# Patient Record
Sex: Female | Born: 1965
Health system: Southern US, Community
[De-identification: ages and names within clinical notes are randomized; demographics above are authoritative.]

## PROBLEM LIST (undated history)

## (undated) DIAGNOSIS — E039 Hypothyroidism, unspecified: Secondary | ICD-10-CM

## (undated) DIAGNOSIS — G8929 Other chronic pain: Secondary | ICD-10-CM

## (undated) DIAGNOSIS — F419 Anxiety disorder, unspecified: Secondary | ICD-10-CM

## (undated) DIAGNOSIS — J45909 Unspecified asthma, uncomplicated: Secondary | ICD-10-CM

## (undated) DIAGNOSIS — G4730 Sleep apnea, unspecified: Secondary | ICD-10-CM

## (undated) DIAGNOSIS — Z9221 Personal history of antineoplastic chemotherapy: Secondary | ICD-10-CM

## (undated) DIAGNOSIS — K589 Irritable bowel syndrome without diarrhea: Secondary | ICD-10-CM

## (undated) DIAGNOSIS — K4090 Unilateral inguinal hernia, without obstruction or gangrene, not specified as recurrent: Secondary | ICD-10-CM

## (undated) DIAGNOSIS — C801 Malignant (primary) neoplasm, unspecified: Secondary | ICD-10-CM

## (undated) DIAGNOSIS — R51 Headache: Secondary | ICD-10-CM

## (undated) DIAGNOSIS — K5010 Crohn's disease of large intestine without complications: Secondary | ICD-10-CM

## (undated) DIAGNOSIS — Z9289 Personal history of other medical treatment: Secondary | ICD-10-CM

## (undated) DIAGNOSIS — R011 Cardiac murmur, unspecified: Secondary | ICD-10-CM

## (undated) DIAGNOSIS — Z923 Personal history of irradiation: Secondary | ICD-10-CM

## (undated) DIAGNOSIS — Z853 Personal history of malignant neoplasm of breast: Secondary | ICD-10-CM

## (undated) DIAGNOSIS — R519 Headache, unspecified: Secondary | ICD-10-CM

## (undated) HISTORY — DX: Malignant (primary) neoplasm, unspecified: C80.1

## (undated) HISTORY — DX: Unspecified asthma, uncomplicated: J45.909

## (undated) HISTORY — DX: Irritable bowel syndrome, unspecified: K58.9

## (undated) HISTORY — DX: Other chronic pain: G89.29

## (undated) HISTORY — DX: Crohn's disease of large intestine without complications: K50.10

## (undated) HISTORY — DX: Personal history of malignant neoplasm of breast: Z85.3

## (undated) HISTORY — DX: Anxiety disorder, unspecified: F41.9

## (undated) HISTORY — DX: Sleep apnea, unspecified: G47.30

## (undated) HISTORY — DX: Personal history of other medical treatment: Z92.89

## (undated) HISTORY — DX: Headache: R51

## (undated) HISTORY — DX: Unilateral inguinal hernia, without obstruction or gangrene, not specified as recurrent: K40.90

## (undated) HISTORY — DX: Hypothyroidism, unspecified: E03.9

## (undated) HISTORY — PX: INGUINAL HERNIA REPAIR: SUR1180

## (undated) HISTORY — DX: Headache, unspecified: R51.9

## (undated) HISTORY — PX: MASTECTOMY: SHX3

## (undated) HISTORY — DX: Cardiac murmur, unspecified: R01.1

---

## 1997-11-23 ENCOUNTER — Encounter: Admission: RE | Admit: 1997-11-23 | Discharge: 1998-02-21 | Payer: Self-pay | Admitting: Oncology

## 1997-11-28 ENCOUNTER — Encounter: Admission: RE | Admit: 1997-11-28 | Discharge: 1997-11-28 | Payer: Self-pay | Admitting: Family Medicine

## 1997-11-29 ENCOUNTER — Ambulatory Visit: Admission: RE | Admit: 1997-11-29 | Discharge: 1997-11-29 | Payer: Self-pay | Admitting: Radiation Oncology

## 1997-12-01 ENCOUNTER — Encounter: Admission: RE | Admit: 1997-12-01 | Discharge: 1997-12-01 | Payer: Self-pay | Admitting: Family Medicine

## 1997-12-07 ENCOUNTER — Encounter: Admission: RE | Admit: 1997-12-07 | Discharge: 1997-12-07 | Payer: Self-pay | Admitting: Family Medicine

## 1998-01-22 ENCOUNTER — Emergency Department (HOSPITAL_COMMUNITY): Admission: EM | Admit: 1998-01-22 | Discharge: 1998-01-22 | Payer: Self-pay | Admitting: Emergency Medicine

## 1998-03-07 ENCOUNTER — Encounter: Admission: RE | Admit: 1998-03-07 | Discharge: 1998-03-07 | Payer: Self-pay | Admitting: Sports Medicine

## 1998-05-18 ENCOUNTER — Ambulatory Visit: Admission: RE | Admit: 1998-05-18 | Discharge: 1998-05-18 | Payer: Self-pay | Admitting: General Surgery

## 1998-05-18 ENCOUNTER — Encounter: Payer: Self-pay | Admitting: General Surgery

## 1998-08-15 ENCOUNTER — Encounter: Admission: RE | Admit: 1998-08-15 | Discharge: 1998-08-15 | Payer: Self-pay | Admitting: Sports Medicine

## 1998-08-23 ENCOUNTER — Encounter: Admission: RE | Admit: 1998-08-23 | Discharge: 1998-11-21 | Payer: Self-pay | Admitting: Radiation Oncology

## 1999-01-04 ENCOUNTER — Ambulatory Visit (HOSPITAL_COMMUNITY): Admission: RE | Admit: 1999-01-04 | Discharge: 1999-01-04 | Payer: Self-pay | Admitting: Oncology

## 1999-01-04 ENCOUNTER — Encounter: Payer: Self-pay | Admitting: Oncology

## 1999-10-17 ENCOUNTER — Ambulatory Visit (HOSPITAL_COMMUNITY): Admission: RE | Admit: 1999-10-17 | Discharge: 1999-10-17 | Payer: Self-pay | Admitting: General Surgery

## 1999-10-17 ENCOUNTER — Encounter: Payer: Self-pay | Admitting: General Surgery

## 2000-04-24 ENCOUNTER — Ambulatory Visit (HOSPITAL_BASED_OUTPATIENT_CLINIC_OR_DEPARTMENT_OTHER): Admission: RE | Admit: 2000-04-24 | Discharge: 2000-04-24 | Payer: Self-pay | Admitting: *Deleted

## 2000-06-20 ENCOUNTER — Encounter: Admission: RE | Admit: 2000-06-20 | Discharge: 2000-06-20 | Payer: Self-pay | Admitting: Family Medicine

## 2000-10-09 ENCOUNTER — Encounter: Payer: Self-pay | Admitting: General Surgery

## 2000-10-09 ENCOUNTER — Encounter: Admission: RE | Admit: 2000-10-09 | Discharge: 2000-10-09 | Payer: Self-pay | Admitting: General Surgery

## 2000-10-10 ENCOUNTER — Encounter: Payer: Self-pay | Admitting: General Surgery

## 2000-10-10 ENCOUNTER — Encounter (INDEPENDENT_AMBULATORY_CARE_PROVIDER_SITE_OTHER): Payer: Self-pay | Admitting: *Deleted

## 2000-10-10 ENCOUNTER — Ambulatory Visit (HOSPITAL_BASED_OUTPATIENT_CLINIC_OR_DEPARTMENT_OTHER): Admission: RE | Admit: 2000-10-10 | Discharge: 2000-10-10 | Payer: Self-pay | Admitting: General Surgery

## 2001-05-28 ENCOUNTER — Encounter: Admission: RE | Admit: 2001-05-28 | Discharge: 2001-05-28 | Payer: Self-pay | Admitting: Family Medicine

## 2001-06-05 ENCOUNTER — Ambulatory Visit (HOSPITAL_COMMUNITY): Admission: RE | Admit: 2001-06-05 | Discharge: 2001-06-05 | Payer: Self-pay | Admitting: Obstetrics

## 2001-06-23 ENCOUNTER — Encounter: Admission: RE | Admit: 2001-06-23 | Discharge: 2001-06-23 | Payer: Self-pay | Admitting: Family Medicine

## 2001-06-24 ENCOUNTER — Emergency Department (HOSPITAL_COMMUNITY): Admission: EM | Admit: 2001-06-24 | Discharge: 2001-06-24 | Payer: Self-pay | Admitting: Emergency Medicine

## 2001-07-07 ENCOUNTER — Encounter: Payer: Self-pay | Admitting: Family Medicine

## 2001-07-07 ENCOUNTER — Ambulatory Visit (HOSPITAL_COMMUNITY): Admission: RE | Admit: 2001-07-07 | Discharge: 2001-07-07 | Payer: Self-pay | Admitting: Family Medicine

## 2001-07-14 ENCOUNTER — Encounter: Admission: RE | Admit: 2001-07-14 | Discharge: 2001-07-14 | Payer: Self-pay | Admitting: Sports Medicine

## 2001-08-21 ENCOUNTER — Encounter: Admission: RE | Admit: 2001-08-21 | Discharge: 2001-08-21 | Payer: Self-pay | Admitting: Family Medicine

## 2001-08-21 ENCOUNTER — Encounter: Payer: Self-pay | Admitting: Family Medicine

## 2001-09-08 ENCOUNTER — Encounter: Admission: RE | Admit: 2001-09-08 | Discharge: 2001-09-08 | Payer: Self-pay | Admitting: Family Medicine

## 2001-10-12 ENCOUNTER — Encounter: Payer: Self-pay | Admitting: General Surgery

## 2001-10-12 ENCOUNTER — Encounter: Admission: RE | Admit: 2001-10-12 | Discharge: 2001-10-12 | Payer: Self-pay | Admitting: General Surgery

## 2001-11-10 DIAGNOSIS — Z9289 Personal history of other medical treatment: Secondary | ICD-10-CM

## 2001-11-10 HISTORY — DX: Personal history of other medical treatment: Z92.89

## 2001-11-23 ENCOUNTER — Encounter: Admission: RE | Admit: 2001-11-23 | Discharge: 2001-11-23 | Payer: Self-pay | Admitting: Family Medicine

## 2001-11-25 ENCOUNTER — Ambulatory Visit (HOSPITAL_COMMUNITY): Admission: RE | Admit: 2001-11-25 | Discharge: 2001-11-25 | Payer: Self-pay | Admitting: Family Medicine

## 2001-11-25 ENCOUNTER — Encounter: Payer: Self-pay | Admitting: Family Medicine

## 2001-12-07 ENCOUNTER — Encounter: Admission: RE | Admit: 2001-12-07 | Discharge: 2001-12-22 | Payer: Self-pay | Admitting: Sports Medicine

## 2002-03-26 ENCOUNTER — Encounter: Admission: RE | Admit: 2002-03-26 | Discharge: 2002-03-26 | Payer: Self-pay | Admitting: Family Medicine

## 2002-06-10 ENCOUNTER — Encounter: Admission: RE | Admit: 2002-06-10 | Discharge: 2002-06-10 | Payer: Self-pay | Admitting: Family Medicine

## 2002-06-15 ENCOUNTER — Encounter: Payer: Self-pay | Admitting: Sports Medicine

## 2002-06-15 ENCOUNTER — Encounter: Admission: RE | Admit: 2002-06-15 | Discharge: 2002-06-15 | Payer: Self-pay | Admitting: Sports Medicine

## 2002-07-14 ENCOUNTER — Encounter: Payer: Self-pay | Admitting: General Surgery

## 2002-07-14 ENCOUNTER — Encounter (INDEPENDENT_AMBULATORY_CARE_PROVIDER_SITE_OTHER): Payer: Self-pay | Admitting: *Deleted

## 2002-07-14 ENCOUNTER — Other Ambulatory Visit: Admission: RE | Admit: 2002-07-14 | Discharge: 2002-07-14 | Payer: Self-pay | Admitting: Diagnostic Radiology

## 2002-07-14 ENCOUNTER — Encounter: Admission: RE | Admit: 2002-07-14 | Discharge: 2002-07-14 | Payer: Self-pay | Admitting: General Surgery

## 2002-07-19 ENCOUNTER — Encounter: Admission: RE | Admit: 2002-07-19 | Discharge: 2002-07-19 | Payer: Self-pay | Admitting: Family Medicine

## 2002-07-22 ENCOUNTER — Encounter: Admission: RE | Admit: 2002-07-22 | Discharge: 2002-07-22 | Payer: Self-pay | Admitting: Oncology

## 2002-07-22 ENCOUNTER — Encounter: Payer: Self-pay | Admitting: Oncology

## 2002-07-28 ENCOUNTER — Encounter: Payer: Self-pay | Admitting: Oncology

## 2002-07-28 ENCOUNTER — Encounter: Admission: RE | Admit: 2002-07-28 | Discharge: 2002-07-28 | Payer: Self-pay | Admitting: Oncology

## 2002-07-29 ENCOUNTER — Ambulatory Visit (HOSPITAL_COMMUNITY): Admission: RE | Admit: 2002-07-29 | Discharge: 2002-07-29 | Payer: Self-pay | Admitting: Oncology

## 2002-07-29 ENCOUNTER — Encounter: Payer: Self-pay | Admitting: Oncology

## 2002-07-29 ENCOUNTER — Encounter: Payer: Self-pay | Admitting: General Surgery

## 2002-08-03 ENCOUNTER — Ambulatory Visit (HOSPITAL_COMMUNITY): Admission: RE | Admit: 2002-08-03 | Discharge: 2002-08-05 | Payer: Self-pay | Admitting: General Surgery

## 2002-08-03 ENCOUNTER — Encounter (INDEPENDENT_AMBULATORY_CARE_PROVIDER_SITE_OTHER): Payer: Self-pay | Admitting: Specialist

## 2002-09-27 ENCOUNTER — Ambulatory Visit (HOSPITAL_BASED_OUTPATIENT_CLINIC_OR_DEPARTMENT_OTHER): Admission: RE | Admit: 2002-09-27 | Discharge: 2002-09-27 | Payer: Self-pay | Admitting: General Surgery

## 2002-09-27 ENCOUNTER — Encounter: Payer: Self-pay | Admitting: General Surgery

## 2002-10-12 ENCOUNTER — Encounter (INDEPENDENT_AMBULATORY_CARE_PROVIDER_SITE_OTHER): Payer: Self-pay | Admitting: Cardiology

## 2002-10-12 ENCOUNTER — Ambulatory Visit: Admission: RE | Admit: 2002-10-12 | Discharge: 2002-10-12 | Payer: Self-pay | Admitting: Oncology

## 2002-10-15 ENCOUNTER — Encounter: Payer: Self-pay | Admitting: Oncology

## 2002-10-15 ENCOUNTER — Observation Stay (HOSPITAL_COMMUNITY): Admission: EM | Admit: 2002-10-15 | Discharge: 2002-10-17 | Payer: Self-pay | Admitting: Oncology

## 2002-10-20 ENCOUNTER — Encounter: Admission: RE | Admit: 2002-10-20 | Discharge: 2002-10-20 | Payer: Self-pay | Admitting: General Surgery

## 2002-10-20 ENCOUNTER — Encounter: Payer: Self-pay | Admitting: General Surgery

## 2003-04-19 ENCOUNTER — Ambulatory Visit (HOSPITAL_BASED_OUTPATIENT_CLINIC_OR_DEPARTMENT_OTHER): Admission: RE | Admit: 2003-04-19 | Discharge: 2003-04-19 | Payer: Self-pay | Admitting: General Surgery

## 2003-04-26 ENCOUNTER — Encounter: Payer: Self-pay | Admitting: General Surgery

## 2003-04-26 ENCOUNTER — Encounter: Admission: RE | Admit: 2003-04-26 | Discharge: 2003-04-26 | Payer: Self-pay | Admitting: General Surgery

## 2003-07-12 ENCOUNTER — Encounter: Admission: RE | Admit: 2003-07-12 | Discharge: 2003-07-12 | Payer: Self-pay | Admitting: Family Medicine

## 2003-09-20 ENCOUNTER — Encounter: Admission: RE | Admit: 2003-09-20 | Discharge: 2003-09-20 | Payer: Self-pay | Admitting: Family Medicine

## 2003-10-21 ENCOUNTER — Encounter: Admission: RE | Admit: 2003-10-21 | Discharge: 2003-10-21 | Payer: Self-pay | Admitting: Oncology

## 2004-01-27 ENCOUNTER — Encounter: Admission: RE | Admit: 2004-01-27 | Discharge: 2004-01-27 | Payer: Self-pay | Admitting: General Surgery

## 2004-02-28 ENCOUNTER — Encounter (INDEPENDENT_AMBULATORY_CARE_PROVIDER_SITE_OTHER): Payer: Self-pay | Admitting: *Deleted

## 2004-02-28 ENCOUNTER — Ambulatory Visit (HOSPITAL_BASED_OUTPATIENT_CLINIC_OR_DEPARTMENT_OTHER): Admission: RE | Admit: 2004-02-28 | Discharge: 2004-02-28 | Payer: Self-pay | Admitting: Plastic Surgery

## 2004-02-28 ENCOUNTER — Ambulatory Visit (HOSPITAL_COMMUNITY): Admission: RE | Admit: 2004-02-28 | Discharge: 2004-02-28 | Payer: Self-pay | Admitting: Plastic Surgery

## 2004-03-30 ENCOUNTER — Encounter: Admission: RE | Admit: 2004-03-30 | Discharge: 2004-03-30 | Payer: Self-pay | Admitting: General Surgery

## 2004-10-22 ENCOUNTER — Encounter: Admission: RE | Admit: 2004-10-22 | Discharge: 2004-10-22 | Payer: Self-pay | Admitting: Oncology

## 2004-11-15 ENCOUNTER — Ambulatory Visit: Payer: Self-pay | Admitting: Oncology

## 2005-05-15 ENCOUNTER — Encounter: Admission: RE | Admit: 2005-05-15 | Discharge: 2005-05-15 | Payer: Self-pay | Admitting: General Surgery

## 2005-05-16 ENCOUNTER — Ambulatory Visit: Payer: Self-pay | Admitting: Oncology

## 2005-05-31 ENCOUNTER — Ambulatory Visit (HOSPITAL_COMMUNITY): Admission: RE | Admit: 2005-05-31 | Discharge: 2005-05-31 | Payer: Self-pay | Admitting: Family Medicine

## 2005-05-31 ENCOUNTER — Ambulatory Visit: Payer: Self-pay | Admitting: Sports Medicine

## 2005-06-01 ENCOUNTER — Ambulatory Visit (HOSPITAL_COMMUNITY): Admission: RE | Admit: 2005-06-01 | Discharge: 2005-06-01 | Payer: Self-pay | Admitting: Obstetrics and Gynecology

## 2005-07-25 ENCOUNTER — Encounter (INDEPENDENT_AMBULATORY_CARE_PROVIDER_SITE_OTHER): Payer: Self-pay | Admitting: Specialist

## 2005-07-25 ENCOUNTER — Observation Stay (HOSPITAL_COMMUNITY): Admission: RE | Admit: 2005-07-25 | Discharge: 2005-07-25 | Payer: Self-pay | Admitting: Obstetrics and Gynecology

## 2005-08-12 HISTORY — PX: TOTAL ABDOMINAL HYSTERECTOMY W/ BILATERAL SALPINGOOPHORECTOMY: SHX83

## 2005-08-16 ENCOUNTER — Ambulatory Visit: Payer: Self-pay | Admitting: Oncology

## 2005-10-23 ENCOUNTER — Encounter: Admission: RE | Admit: 2005-10-23 | Discharge: 2005-10-23 | Payer: Self-pay | Admitting: Oncology

## 2005-11-11 ENCOUNTER — Ambulatory Visit: Payer: Self-pay | Admitting: Family Medicine

## 2005-11-16 ENCOUNTER — Encounter (INDEPENDENT_AMBULATORY_CARE_PROVIDER_SITE_OTHER): Payer: Self-pay | Admitting: *Deleted

## 2005-11-16 LAB — CONVERTED CEMR LAB

## 2005-11-21 ENCOUNTER — Ambulatory Visit: Payer: Self-pay | Admitting: Oncology

## 2006-01-27 ENCOUNTER — Ambulatory Visit: Payer: Self-pay | Admitting: Sports Medicine

## 2006-04-04 ENCOUNTER — Ambulatory Visit (HOSPITAL_COMMUNITY): Admission: RE | Admit: 2006-04-04 | Discharge: 2006-04-04 | Payer: Self-pay | Admitting: Endocrinology

## 2006-06-24 ENCOUNTER — Encounter: Admission: RE | Admit: 2006-06-24 | Discharge: 2006-06-24 | Payer: Self-pay | Admitting: General Surgery

## 2006-06-30 ENCOUNTER — Encounter: Admission: RE | Admit: 2006-06-30 | Discharge: 2006-06-30 | Payer: Self-pay | Admitting: General Surgery

## 2006-07-23 ENCOUNTER — Ambulatory Visit: Payer: Self-pay | Admitting: Oncology

## 2006-07-25 LAB — CBC WITH DIFFERENTIAL/PLATELET
BASO%: 0.5 % (ref 0.0–2.0)
EOS%: 1.9 % (ref 0.0–7.0)
HCT: 36.2 % (ref 34.8–46.6)
MCH: 27.7 pg (ref 26.0–34.0)
MCHC: 33.4 g/dL (ref 32.0–36.0)
MONO#: 0.4 10*3/uL (ref 0.1–0.9)
NEUT%: 61.3 % (ref 39.6–76.8)
RBC: 4.35 10*6/uL (ref 3.70–5.32)
WBC: 4.7 10*3/uL (ref 3.9–10.0)
lymph#: 1.3 10*3/uL (ref 0.9–3.3)

## 2006-07-25 LAB — COMPREHENSIVE METABOLIC PANEL
ALT: 15 U/L (ref 0–35)
AST: 18 U/L (ref 0–37)
Calcium: 9.4 mg/dL (ref 8.4–10.5)
Chloride: 103 mEq/L (ref 96–112)
Creatinine, Ser: 0.56 mg/dL (ref 0.40–1.20)
Sodium: 139 mEq/L (ref 135–145)
Total Bilirubin: 0.3 mg/dL (ref 0.3–1.2)
Total Protein: 6.7 g/dL (ref 6.0–8.3)

## 2006-10-09 DIAGNOSIS — F410 Panic disorder [episodic paroxysmal anxiety] without agoraphobia: Secondary | ICD-10-CM | POA: Insufficient documentation

## 2006-10-09 DIAGNOSIS — N3941 Urge incontinence: Secondary | ICD-10-CM | POA: Insufficient documentation

## 2006-10-10 ENCOUNTER — Encounter (INDEPENDENT_AMBULATORY_CARE_PROVIDER_SITE_OTHER): Payer: Self-pay | Admitting: *Deleted

## 2006-12-11 ENCOUNTER — Encounter: Payer: Self-pay | Admitting: Family Medicine

## 2006-12-11 ENCOUNTER — Encounter: Admission: RE | Admit: 2006-12-11 | Discharge: 2006-12-11 | Payer: Self-pay | Admitting: Oncology

## 2007-01-27 ENCOUNTER — Telehealth: Payer: Self-pay | Admitting: *Deleted

## 2007-01-27 ENCOUNTER — Ambulatory Visit: Payer: Self-pay | Admitting: Family Medicine

## 2007-01-27 LAB — CONVERTED CEMR LAB
Nitrite: POSITIVE
Specific Gravity, Urine: 1.02
pH: 7.5

## 2007-04-21 ENCOUNTER — Ambulatory Visit: Payer: Self-pay | Admitting: Oncology

## 2007-06-26 ENCOUNTER — Encounter: Admission: RE | Admit: 2007-06-26 | Discharge: 2007-06-26 | Payer: Self-pay | Admitting: Oncology

## 2007-06-26 ENCOUNTER — Encounter: Payer: Self-pay | Admitting: Family Medicine

## 2007-07-14 ENCOUNTER — Encounter: Payer: Self-pay | Admitting: Family Medicine

## 2007-07-14 ENCOUNTER — Ambulatory Visit: Payer: Self-pay | Admitting: Family Medicine

## 2007-07-14 DIAGNOSIS — N951 Menopausal and female climacteric states: Secondary | ICD-10-CM | POA: Insufficient documentation

## 2007-07-14 DIAGNOSIS — E042 Nontoxic multinodular goiter: Secondary | ICD-10-CM | POA: Insufficient documentation

## 2007-07-14 DIAGNOSIS — M542 Cervicalgia: Secondary | ICD-10-CM | POA: Insufficient documentation

## 2007-07-14 DIAGNOSIS — M21619 Bunion of unspecified foot: Secondary | ICD-10-CM | POA: Insufficient documentation

## 2007-07-14 LAB — CONVERTED CEMR LAB
LDL Cholesterol: 137 mg/dL — ABNORMAL HIGH (ref 0–99)
TSH: 1.2 microintl units/mL (ref 0.350–5.50)
Triglycerides: 117 mg/dL (ref <150)

## 2007-07-16 ENCOUNTER — Encounter: Payer: Self-pay | Admitting: Family Medicine

## 2007-07-16 ENCOUNTER — Ambulatory Visit (HOSPITAL_COMMUNITY): Admission: RE | Admit: 2007-07-16 | Discharge: 2007-07-16 | Payer: Self-pay | Admitting: Family Medicine

## 2007-07-28 ENCOUNTER — Encounter: Admission: RE | Admit: 2007-07-28 | Discharge: 2007-08-12 | Payer: Self-pay | Admitting: Family Medicine

## 2007-08-17 ENCOUNTER — Encounter: Admission: RE | Admit: 2007-08-17 | Discharge: 2007-09-25 | Payer: Self-pay | Admitting: Family Medicine

## 2007-09-01 ENCOUNTER — Encounter: Payer: Self-pay | Admitting: Family Medicine

## 2007-09-30 ENCOUNTER — Encounter: Payer: Self-pay | Admitting: Family Medicine

## 2007-11-24 ENCOUNTER — Encounter: Payer: Self-pay | Admitting: Family Medicine

## 2007-12-17 ENCOUNTER — Ambulatory Visit: Payer: Self-pay | Admitting: Oncology

## 2007-12-21 ENCOUNTER — Encounter: Payer: Self-pay | Admitting: Family Medicine

## 2008-01-26 ENCOUNTER — Ambulatory Visit (HOSPITAL_BASED_OUTPATIENT_CLINIC_OR_DEPARTMENT_OTHER): Admission: RE | Admit: 2008-01-26 | Discharge: 2008-01-26 | Payer: Self-pay | Admitting: Orthopedic Surgery

## 2008-03-14 ENCOUNTER — Encounter: Admission: RE | Admit: 2008-03-14 | Discharge: 2008-03-14 | Payer: Self-pay | Admitting: Oncology

## 2008-03-16 ENCOUNTER — Ambulatory Visit: Payer: Self-pay | Admitting: Family Medicine

## 2008-03-16 LAB — CONVERTED CEMR LAB: Whiff Test: NEGATIVE

## 2008-04-11 ENCOUNTER — Encounter: Payer: Self-pay | Admitting: Family Medicine

## 2008-06-27 ENCOUNTER — Encounter: Admission: RE | Admit: 2008-06-27 | Discharge: 2008-06-27 | Payer: Self-pay | Admitting: Oncology

## 2008-07-13 ENCOUNTER — Ambulatory Visit: Payer: Self-pay | Admitting: Oncology

## 2008-07-15 ENCOUNTER — Encounter: Payer: Self-pay | Admitting: Family Medicine

## 2008-07-21 ENCOUNTER — Ambulatory Visit: Payer: Self-pay | Admitting: Family Medicine

## 2008-07-21 ENCOUNTER — Encounter: Payer: Self-pay | Admitting: Family Medicine

## 2008-07-21 DIAGNOSIS — IMO0002 Reserved for concepts with insufficient information to code with codable children: Secondary | ICD-10-CM | POA: Insufficient documentation

## 2008-07-21 DIAGNOSIS — N952 Postmenopausal atrophic vaginitis: Secondary | ICD-10-CM | POA: Insufficient documentation

## 2008-07-21 LAB — CONVERTED CEMR LAB

## 2008-08-12 HISTORY — PX: MASTECTOMY: SHX3

## 2008-12-13 ENCOUNTER — Encounter: Payer: Self-pay | Admitting: Family Medicine

## 2008-12-13 ENCOUNTER — Encounter: Admission: RE | Admit: 2008-12-13 | Discharge: 2008-12-13 | Payer: Self-pay | Admitting: Oncology

## 2009-01-10 ENCOUNTER — Ambulatory Visit: Payer: Self-pay | Admitting: Oncology

## 2009-01-12 ENCOUNTER — Encounter: Payer: Self-pay | Admitting: Family Medicine

## 2009-01-25 ENCOUNTER — Encounter: Admission: RE | Admit: 2009-01-25 | Discharge: 2009-01-25 | Payer: Self-pay | Admitting: Oncology

## 2009-06-27 ENCOUNTER — Encounter: Admission: RE | Admit: 2009-06-27 | Discharge: 2009-06-27 | Payer: Self-pay | Admitting: Oncology

## 2009-07-11 ENCOUNTER — Ambulatory Visit: Payer: Self-pay | Admitting: Oncology

## 2009-07-14 ENCOUNTER — Encounter: Payer: Self-pay | Admitting: Family Medicine

## 2009-08-14 ENCOUNTER — Ambulatory Visit: Payer: Self-pay | Admitting: Psychology

## 2009-08-23 ENCOUNTER — Ambulatory Visit: Payer: Self-pay | Admitting: Psychology

## 2009-09-06 ENCOUNTER — Encounter: Payer: Self-pay | Admitting: Family Medicine

## 2009-09-12 ENCOUNTER — Encounter: Payer: Self-pay | Admitting: Family Medicine

## 2009-09-12 ENCOUNTER — Ambulatory Visit: Payer: Self-pay | Admitting: Family Medicine

## 2009-09-12 DIAGNOSIS — F4321 Adjustment disorder with depressed mood: Secondary | ICD-10-CM | POA: Insufficient documentation

## 2009-09-13 ENCOUNTER — Encounter: Payer: Self-pay | Admitting: Family Medicine

## 2009-10-24 ENCOUNTER — Encounter: Payer: Self-pay | Admitting: Family Medicine

## 2009-12-26 ENCOUNTER — Encounter: Payer: Self-pay | Admitting: Family Medicine

## 2010-01-10 ENCOUNTER — Encounter: Admission: RE | Admit: 2010-01-10 | Discharge: 2010-01-10 | Payer: Self-pay | Admitting: Oncology

## 2010-01-31 ENCOUNTER — Ambulatory Visit: Payer: Self-pay | Admitting: Oncology

## 2010-02-02 ENCOUNTER — Encounter: Payer: Self-pay | Admitting: Family Medicine

## 2010-05-29 ENCOUNTER — Encounter: Admission: RE | Admit: 2010-05-29 | Discharge: 2010-05-29 | Payer: Self-pay | Admitting: Oncology

## 2010-06-12 ENCOUNTER — Encounter: Payer: Self-pay | Admitting: Family Medicine

## 2010-07-24 ENCOUNTER — Ambulatory Visit: Payer: Self-pay | Admitting: Oncology

## 2010-07-26 ENCOUNTER — Encounter: Payer: Self-pay | Admitting: Family Medicine

## 2010-08-30 ENCOUNTER — Telehealth: Payer: Self-pay | Admitting: *Deleted

## 2010-09-01 ENCOUNTER — Encounter: Payer: Self-pay | Admitting: Family Medicine

## 2010-09-01 ENCOUNTER — Encounter: Payer: Self-pay | Admitting: Sports Medicine

## 2010-09-02 ENCOUNTER — Encounter: Payer: Self-pay | Admitting: Endocrinology

## 2010-09-13 NOTE — Miscellaneous (Signed)
  Clinical Lists Changes  Medications: Removed medication of LYRICA 75 MG  CAPS (PREGABALIN) bid Added new medication of WELLBUTRIN 75 MG TABS (BUPROPION HCL) one two times a day - Signed Added new medication of LORAZEPAM 0.5 MG TABS (LORAZEPAM) three times a day as needed anxiety - Signed Rx of WELLBUTRIN 75 MG TABS (BUPROPION HCL) one two times a day;  #60 x 3;  Signed;  Entered by: Tereasa Coop NP;  Authorized by: Tereasa Coop NP;  Method used: Printed then faxed to  Rx of LORAZEPAM 0.5 MG TABS (LORAZEPAM) three times a day as needed anxiety;  #90 x 0;  Signed;  Entered by: Tereasa Coop NP;  Authorized by: Tereasa Coop NP;  Method used: Printed then faxed to Going through the breakup of her marriage, depressed, having anxiety and avoiding the situation.  Apt 2/1 with me.   Prescriptions: LORAZEPAM 0.5 MG TABS (LORAZEPAM) three times a day as needed anxiety  #90 x 0   Entered and Authorized by:   Tereasa Coop NP   Signed by:   Tereasa Coop NP on 09/06/2009   Method used:   Printed then faxed to ...         RxID:   [id number] WELLBUTRIN 75 MG TABS (BUPROPION HCL) one two times a day  #60 x 3   Entered and Authorized by:   Tereasa Coop NP   Signed by:   Tereasa Coop NP on 09/06/2009   Method used:   Printed then faxed to ...         RxID:   [id number]

## 2010-09-13 NOTE — Letter (Signed)
Summary: Generic Letter  [facility]  [address]   [place], [place] [address]   Phone: [phone]  Fax: [phone]    09/13/2009  [place], [place]  [address]  Dear Ms. Hulgan,   Your TSH was normal at 1.0.  Will stay on the same dosage.  Hang in there, life does challenge [place] in so many ways.   Let me know if I can help.  We probably should check your lipids in a fasting state sometime this year.  I have put the order in for the lab, you just need to come by one AM before eating and sign in for labs.  The lab will call you, it usually only takes a short time.       Sincerely,   Tereasa Coop NP  Appended Document: Generic Letter mailed.

## 2010-09-13 NOTE — Miscellaneous (Signed)
  Clinical Lists Changes  Problems: Removed problem of SCREENING FOR MALIGNANT NEOPLASM OF THE CERVIX (ICD-V76.2) Removed problem of SCREENING FOR LIPOID DISORDERS (ICD-V77.91) Removed problem of BREAST CANCER (ICD-174.9)

## 2010-09-13 NOTE — Progress Notes (Signed)
Summary: Med Request  Phone Note Call from Patient   Summary of Call: Patient would like something to help her sleep.  She hasn't been seen in almost a year.  Should I tell her to make an appt?   Initial call taken by: Elray Mcgregor RN,  August 30, 2010 2:16 PM  Follow-up for Phone Call        Left message that I would consider one month of Rozerem for one month if she comes in within the month, otherwise if she wants a controlled substance she will need to have a visit. Follow-up by: Tereasa Coop NP,  August 30, 2010 4:22 PM

## 2010-09-13 NOTE — Consult Note (Signed)
Summary: [facility]  [facility]   Imported By: Audie Clear 08/28/2009 10:47:43  _____________________________________________________________________  External Attachment:    Type:   Image     Comment:   External Document

## 2010-09-13 NOTE — Miscellaneous (Signed)
Summary: Bupropion faxed to [facility]  Clinical Lists Changes  Medications: Changed medication from [facility] 75 MG TABS (BUPROPION HCL) one two times a day to BUPROPION HCL 75 MG TABS (BUPROPION HCL) one tab two times a day - Signed Rx of BUPROPION HCL 75 MG TABS (BUPROPION HCL) one tab two times a day;  #180 x 3;  Signed;  Entered by: Tereasa Coop NP;  Authorized by: Tereasa Coop NP;  Method used: Faxed to [place]*, , ,   , Ph: [phone], Fax: [phone]    Prescriptions: BUPROPION HCL 75 MG TABS (BUPROPION HCL) one tab two times a day  #180 x 3   Entered and Authorized by:   Tereasa Coop NP   Signed by:   Tereasa Coop NP on 10/24/2009   Method used:   Faxed to ...       [facility] (mail-order)             ,          Ph: [phone]       Fax: [phone]   RxID:   [id number]

## 2010-09-13 NOTE — Consult Note (Signed)
Summary: [name]   Imported By: Beryle Lathe 08/10/2010 12:09:21  _____________________________________________________________________  External Attachment:    Type:   Image     Comment:   External Document

## 2010-09-13 NOTE — Consult Note (Signed)
Summary: [facility]  [place]   Imported By: Samara Snide 03/02/2010 12:20:57  _____________________________________________________________________  External Attachment:    Type:   Image     Comment:   External Document

## 2010-09-13 NOTE — Miscellaneous (Signed)
  Clinical Lists Changes  Orders: Added new Test order of Lipid-[facility] [phone]) - Signed

## 2010-09-13 NOTE — Miscellaneous (Signed)
  Clinical Lists Changes  Medications: Changed medication from LORAZEPAM 0.5 MG TABS (LORAZEPAM) three times a day as needed anxiety to LORAZEPAM 0.5 MG TABS (LORAZEPAM) three times a day as needed anxiety - Signed Rx of LORAZEPAM 0.5 MG TABS (LORAZEPAM) three times a day as needed anxiety;  #90 x 0;  Signed;  Entered by: Tereasa Coop NP;  Authorized by: Tereasa Coop NP;  Method used: Print then Give to Patient    Prescriptions: LORAZEPAM 0.5 MG TABS (LORAZEPAM) three times a day as needed anxiety  #90 x 0   Entered and Authorized by:   Tereasa Coop NP   Signed by:   Tereasa Coop NP on 12/26/2009   Method used:   Print then Give to Patient   RxID:   [id number]

## 2010-09-13 NOTE — Assessment & Plan Note (Signed)
Summary: f/up,tcb   Vital Signs:  Patient profile:   45 year old female Height:      65.75 inches Weight:      165 pounds BMI:     26.93 Pulse rate:   70 / minute BP sitting:   107 / 77  (right arm)  Vitals Entered By: Mauricia Area CMA, (September 12, 2009 9:19 AM) CC: thyroid f/up. refill meds. Is Patient Diabetic? No Pain Assessment Patient in pain? no        Primary Care Provider:  Tereasa Coop NP  CC:  thyroid f/up. refill meds..  History of Present Illness: Spoke with Ms Galer several days ago, going through [place] problems, husband wants to separate since Christmas.  Having anxiety and avoidance.  Has started Wellbutrin and as needed lorazepam, now for 6 days and is feeling better.  Stilll has dysphagia, beleives it is due to her thyroid nodules, but after full evaluation by endo and biopsy she decided not to have surgery.  Followed closly by oncology for recurrent breast cancer, has biannual mammograms and [place].  Habits & Providers  Alcohol-Tobacco-Diet     Tobacco Status: never     Tobacco Counseling: not indicated; no tobacco use  Social History: Married, w/ stepson, separation 2011 No tob, etoh, drugs.   Very active, eats well.   Layed off from [facility], now with [employer]  Physical Exam  General:  Well-developed,well-nourished,in no acute distress; alert,appropriate and cooperative throughout examination Neck:  palpable thyroid gland, left side larger than right Lungs:  normal respiratory effort and normal breath sounds.   Heart:  normal rate and regular rhythm.   Psych:  normally interactive, good eye contact, not anxious appearing, and dysphoric affect.     Impression & Recommendations:  Problem # 1:  DEPRESSION, SITUATIONAL (ICD-309.0)  Husband requesting separation one month ago, started meds one week ago, feels better, knows that this will be difficult.  Orders: [place]- Est Level  3 ([id number])  Problem # 2:  GOITER, MULTINODULAR  (QPR-916.1) Currently on suppressive replacement, will consider rescan of thyroid gland in future Orders: TSH-[facility] ([id number]) [place]- Est Level  3 ([id number])  Complete Medication List: 1)  Synthroid 88 Mcg Tabs (Levothyroxine sodium) .... Qd 2)  Flonase 50 Mcg/act Susp (Fluticasone propionate) .... 2 squirts in each nostril daily 3)  Femara 2.5 Mg Tabs (Letrozole) 4)  Wellbutrin 75 Mg Tabs (Bupropion hcl) .... One two times a day 5)  Lorazepam 0.5 Mg Tabs (Lorazepam) .... Three times a day as needed anxiety  Other Orders: Tdap => 29yr IM ([phone] Admin 1st Vaccine [phone] Admin 1st Vaccine (Forensic scientist ([phone] Influenza Vaccine NON MCR ([phone] Prescriptions: WELLBUTRIN 75 MG TABS (BUPROPION HCL) one two times a day  #180 x 4   Entered and Authorized by:   STereasa CoopNP   Signed by:   STereasa CoopNP on 09/12/2009   Method used:   Print then Give to Patient   RxID:   [id number]   Prevention & Chronic Care Immunizations   Influenza vaccine: Fluvax Non-MCR  (09/12/2009)    Tetanus booster: 09/12/2009: Tdap   Tetanus booster due: 08/12/2008    Pneumococcal vaccine: Not documented  Other Screening   Pap smear: Done.  (11/16/2005)   Pap smear action/deferral: Deferred-3 yr interval  (09/12/2009)   Pap smear due: 11/16/2008    Mammogram: ASSESSMENT: Negative - BI-RADS 1^MM DIGITAL SCREENING  (06/27/2009)   Mammogram due: 12/12/2009   Smoking status: never  (09/12/2009)  Lipids  Total Cholesterol: 218  (07/14/2007)   LDL: 137  (07/14/2007)   LDL Direct: Not documented   HDL: 58  (07/14/2007)   Triglycerides: 117  (07/14/2007)   Nursing Instructions: Give tetanus booster today    Tetanus/Td Vaccine    Vaccine Type: Tdap    Site: left deltoid    Mfr: boostrix    Dose: 0.5 ml    Route: IM    Given by: Mauricia Area CMA,    Exp. Date: 10/07/2011    Lot #: [id number]    VIS given: 06/30/07 version given September 12, 2009.  Influenza Vaccine    Vaccine Type:  Fluvax Non-MCR    Site: right deltoid    Mfr: GlaxoSmithKline    Dose: 0.5 ml    Route: IM    Given by: Mauricia Area CMA,    Exp. Date: 02/08/2010    Lot #: [id number]    VIS given: 03/05/07 version given September 12, 2009.  Flu Vaccine Consent Questions    Do you have a history of severe allergic reactions to this vaccine? no    Any prior history of allergic reactions to egg and/or gelatin? no    Do you have a sensitivity to the preservative Thimersol? no    Do you have a past history of Guillan-Barre Syndrome? no    Do you currently have an acute febrile illness? no    Have you ever had a severe reaction to latex? no    Vaccine information given and explained to patient? yes    Are you currently pregnant? no

## 2010-09-19 ENCOUNTER — Encounter: Payer: Self-pay | Admitting: *Deleted

## 2010-09-27 ENCOUNTER — Other Ambulatory Visit: Payer: Self-pay | Admitting: Oncology

## 2010-09-27 DIAGNOSIS — Z780 Asymptomatic menopausal state: Secondary | ICD-10-CM

## 2010-10-10 ENCOUNTER — Ambulatory Visit
Admission: RE | Admit: 2010-10-10 | Discharge: 2010-10-10 | Disposition: A | Discharge status: Home / Self Care | Payer: BC Managed Care – PPO | Source: Ambulatory Visit | Attending: Oncology | Admitting: Oncology

## 2010-10-10 DIAGNOSIS — Z780 Asymptomatic menopausal state: Secondary | ICD-10-CM

## 2010-11-02 ENCOUNTER — Telehealth: Payer: Self-pay | Admitting: *Deleted

## 2010-11-02 NOTE — Telephone Encounter (Signed)
Patient has not been seen since 09/2009, must return for a visit for refills

## 2010-11-02 NOTE — Telephone Encounter (Signed)
Patient would like a refill on her synthroid, wellbutrin and detrol.  Asking for a 90 day supply for each and 3 refills.  [facility] [address].

## 2010-11-04 NOTE — Telephone Encounter (Signed)
She scheduled an appt for Tuesday.

## 2010-11-06 ENCOUNTER — Ambulatory Visit (INDEPENDENT_AMBULATORY_CARE_PROVIDER_SITE_OTHER): Payer: BC Managed Care – PPO | Admitting: Family Medicine

## 2010-11-06 ENCOUNTER — Encounter: Payer: Self-pay | Admitting: Family Medicine

## 2010-11-06 VITALS — BP 129/86 | HR 67 | Temp 98.2°F | Ht 65.75 in | Wt 144.3 lb

## 2010-11-06 DIAGNOSIS — E042 Nontoxic multinodular goiter: Secondary | ICD-10-CM

## 2010-11-06 DIAGNOSIS — N952 Postmenopausal atrophic vaginitis: Secondary | ICD-10-CM

## 2010-11-06 DIAGNOSIS — Z136 Encounter for screening for cardiovascular disorders: Secondary | ICD-10-CM

## 2010-11-06 DIAGNOSIS — F4321 Adjustment disorder with depressed mood: Secondary | ICD-10-CM

## 2010-11-06 DIAGNOSIS — N3941 Urge incontinence: Secondary | ICD-10-CM

## 2010-11-06 DIAGNOSIS — E039 Hypothyroidism, unspecified: Secondary | ICD-10-CM

## 2010-11-06 LAB — LIPID PANEL
HDL: 58 mg/dL (ref >39)
LDL Cholesterol: 94 mg/dL (ref 0–99)
Total CHOL/HDL Ratio: 2.9 Ratio
VLDL: 14 mg/dL (ref 0–40)

## 2010-11-06 LAB — POCT WET PREP (WET MOUNT)

## 2010-11-06 MED ORDER — TOLTERODINE TARTRATE ER 4 MG PO CP24: 4.0000 mg | ORAL_CAPSULE | Freq: Every day | ORAL | Status: DC

## 2010-11-06 MED ORDER — LEVOTHYROXINE SODIUM 88 MCG PO TABS: 88.0000 ug | ORAL_TABLET | Freq: Every day | ORAL | Status: DC

## 2010-11-06 MED ORDER — BUPROPION HCL 75 MG PO TABS: 75.0000 mg | ORAL_TABLET | Freq: Two times a day (BID) | ORAL | Status: DC

## 2010-11-06 NOTE — Assessment & Plan Note (Signed)
Continue Wellbutrin, did not refill benzo, she has some left from one year ago.  Using OTC sleep aid prn.

## 2010-11-06 NOTE — Patient Instructions (Signed)
Ask oncology about topical vaginal estrogens for vaginal itching that is severe Return in one year or as needed

## 2010-11-06 NOTE — Progress Notes (Signed)
  Subjective:    Patient ID: [name], female    DOB: 1966/04/01, 45 y.o.   MRN: [id number]  HPIPatient is requesting refills on her routine medications.  She reports feeling her thyroid when she swallows.  Had work up for multinodular goiter several years ago, and is being treated for hypothyroidism on set dose of synthroid at 88 mcg.  She has had this swallowing sensation in the past, it comes and goes.  She reports severe itching in her vagina.  Last pelvic exam was in 2010 and was extremely painful because of stenosis.  She is a breast cancer survivor with one recurrence on Femara.  She had a BSO and hysterectomy leaving in place the head of the cervix in 2009.  She has not had sexual intercourse, she is divorced from her husband.    She describes some episodes of panic mostly at night when [place]  She exercises often, running, and having some hip pain.  She has urge to void when exercising and uses Detrol before running.    Review of Systems  Constitutional: Negative for fever, activity change, appetite change and fatigue.  Respiratory: Negative for cough, choking and shortness of breath.   Cardiovascular: Negative for chest pain.  Gastrointestinal: Negative for nausea, vomiting, abdominal pain, diarrhea and blood in stool.  Genitourinary: Positive for urgency, vaginal discharge and vaginal pain. Negative for dysuria, frequency, vaginal bleeding and pelvic pain.  Musculoskeletal: Negative for back pain, joint swelling and arthralgias.  Skin: Negative for rash.  Psychiatric/Behavioral: Positive for dysphoric mood. Negative for suicidal ideas and agitation. The patient is not nervous/anxious.        Objective:   Physical Exam  Constitutional: She is oriented to person, place, and time. She appears well-developed and well-nourished.  Neck: No thyromegaly present.       No palpable tyroid nodules  Cardiovascular: Normal rate, regular rhythm and  normal heart sounds.   Pulmonary/Chest: Effort normal and breath sounds normal.  Abdominal: There is no tenderness.  Genitourinary:       Self wet prep, had a panic attack at last attempt to pass a small speculum,  Had BSO and uterine removed in 2009, head of cervix was kept, normal PAP for years.  Musculoskeletal: Normal range of motion. She exhibits no edema.       Pain with rotation of right hip  Lymphadenopathy:    She has no cervical adenopathy.  Neurological: She is alert and oriented to person, place, and time.  Skin: Skin is warm and dry.  Psychiatric: Her behavior is normal.          Assessment & Plan:

## 2010-11-06 NOTE — Assessment & Plan Note (Signed)
Refilled Detrol uses prn

## 2010-11-06 NOTE — Assessment & Plan Note (Addendum)
Nodules not palpable since on synthroid, patient did not want to repeat ultrasound.  Check TSH on synthroid for treatment of multinodular goiter.  Dexa done with no evidence of osteopenia.

## 2010-11-06 NOTE — Assessment & Plan Note (Signed)
Wet mount consistent with basal and parabasal cells, recommended topical vaginal estrogens but she will need to clear with oncologist first.  In the meantime she can try OTC Replens.  Last pelvic was a disaster with her screaming with the passing of the smallest speculum, normal PAPs for many years, uncertain why when she had her BSO and uterus  removed that the cervix was maintained.  Recommended she see they GYN that did the surgery for follow up.

## 2010-11-07 ENCOUNTER — Encounter: Payer: Self-pay | Admitting: Family Medicine

## 2010-12-04 ENCOUNTER — Telehealth: Payer: Self-pay | Admitting: Family Medicine

## 2010-12-04 DIAGNOSIS — M25559 Pain in unspecified hip: Secondary | ICD-10-CM | POA: Insufficient documentation

## 2010-12-04 NOTE — Telephone Encounter (Signed)
C/o of pain to rt hip.  Call back asap

## 2010-12-04 NOTE — Telephone Encounter (Signed)
Incorrect phone number, will forward to [employer] [employer] [facility] as she may have a number.  I will order referral to ortho is patient agrees, that is my recommendation.

## 2010-12-04 NOTE — Telephone Encounter (Signed)
Patient states that she c/o some hip pain the last time she saw Manuela Schwartz but it has gotten progressively worse.  Now to the point of numbness in that leg.  Shared this with Manuela Schwartz who is going to refer her to a Orthopedist.  Will find out is patient has a preference.

## 2010-12-05 ENCOUNTER — Telehealth: Payer: Self-pay | Admitting: *Deleted

## 2010-12-05 NOTE — Telephone Encounter (Signed)
Patient has been to [facility] before.

## 2010-12-05 NOTE — Telephone Encounter (Signed)
Great, we need to make an apt with them asap.

## 2010-12-05 NOTE — Telephone Encounter (Signed)
Called and notified patient of appointment.Tephanie Escorcia, Kevin Fenton

## 2010-12-25 NOTE — Op Note (Signed)
[name], [name] NO.:  [id number]   MEDICAL RECORD NO.:  [id number]          PATIENT TYPE:  AMB   LOCATION:  DSC                          FACILITY:  [facility]   PHYSICIAN:  Weber Cooks, M.D.     DATE OF BIRTH:  22-Sep-1965   DATE OF PROCEDURE:  DATE OF DISCHARGE:                               OPERATIVE REPORT   PREOPERATIVE DIAGNOSIS:  Right hallux rigidus.   POSTOPERATIVE DIAGNOSIS:  Right hallux rigidus.   OPERATION:  Right great toe cheilectomy.   ANESTHESIA:  General.   SURGEON:  Weber Cooks, M.D.   ASSISTANT:  Erskine Emery, PA-C.   ESTIMATED BLOOD LOSS:  Minimal.   TOURNIQUET TIME:  Approximately half hour.   COMPLICATIONS:  None.   DISPOSITION:  Stable to PR.   INDICATIONS:  This is a 45 year old female who has had longstanding  dorsal right great toe pain that was interfering with her life.  She was  sent to the above procedure.  All risks which include infection, vessel  injury, persistent pain, worsening pain, stiffness, arthritis, and  possibility of future fusion versus hemiarthroplasty were all explained.  Questions were encouraged and answered.   OPERATION:  The patient was brought to the operating room and placed in  supine position.  After adequate general endotracheal tube anesthesia  was administered as well as Ancef 1 g IV piggyback.  The right lower  extremity was then prepped and draped in sterile manner over proximally  placed thigh tourniquet.  The limb gravity exsantiguinated.  Tourniquet  was elevated to 290 mmHg.  A longitudinal incision on the dorsomedial  aspect of the right great toe MTP joint was then made.  Dissection was  carried down through skin.  Hemostasis was obtained.  EHL was identified  and protected throughout the case within its sheath.  A longitudinal  capsulotomy was then made, and capsule was then elevated both medially  and laterally.  There was bone-on-bone arthritis in the dorsal aspect of  the  metatarsal head.  This was approximately a third of the joint  surface dorsally.  This was then removed with the sagittal saw.  There  was an osteochondral lesion that extended down the center of the head,  approximately 3-4 mm.  This was debrided to bleeding bone with a House  curette.  This was copiously irrigated with normal saline.  On the  dorsal aspect of the base of the proximal phalanx, there was also a  large medium-sized spur as well.  This was removed with a rongeur  initially.  Then looking at the dorsal aspect of the joint, there was  arthritic changes of about the upper quarter of the joint.  Then a  sagittal saw was used to remove this portion.  Both medial and lateral  gutters were debrided spurs as well, and synovitis area was copiously  irrigated with normal saline.  Bone wax was applied to expose the bony  surfaces.  The area was copiously irrigated with normal saline once  again.  The capsule was  closed with 3-0 Vicryl stitch.  Tourniquet was inflated  and hemostasis  was obtained.  Subcu was closed with 3-0 Vicryl.  Skin was closed with  closed 4-0 nylon.  Sterile dressing was applied.  Hard sole shoe was  applied.  The patient was stable to the PR.      Weber Cooks, M.D.  Electronically Signed     PB/MEDQ  D:  01/26/2008  T:  01/27/2008  Job:  [id number]

## 2010-12-28 NOTE — H&P (Signed)
NAMETYAUNA, LACAZE                         ACCOUNT NO.:  [id number]   MEDICAL RECORD NO.:  [id number]                   PATIENT TYPE:  INP   LOCATION:  0273                                 FACILITY:  [facility]   PHYSICIAN:  Izola Price. Benay Spice, M.D.              DATE OF BIRTH:  03-12-1966   DATE OF ADMISSION:  10/15/2002  DATE OF DISCHARGE:                                HISTORY & PHYSICAL   CHIEF COMPLAINT:  1. Fever.  2. Neutropenia.   HISTORY OF PRESENT ILLNESS:  The patient is a pleasant 44 year old female, a  patient of Dominica Severin B. Sherrill, M.D., with a history of recurrent right breast  cancer.  Originally, her breast cancer was diagnosed in 1998, status post  XRT and CMF, ER/PR positive, with good results.  Unfortunately, the cancer  returned in December of 2003, this time invasive ductal carcinoma, ER/PR  positive, Her-10-Nu, having to undergo right mastectomy with TRAM  reconstruction.  She is day #15 status post cycle #1 of AC.  On Monday, she  developed headaches, which were intermittent, extending through Thursday.  Last evening, she developed fever, chills, hot flashes, and feeling  clammy.  In addition, she also developed abdominal cramps which prompted  her to call the office.  She presented for evaluation.  Her labs showed  neutropenia.  She was given outpatient IV antibiotics and IV fluids at the  infusion area.  Unfortunately, she became more febrile, reaching  temperatures of 100.6 degrees.  She became more symptomatic.  Therefore it  was concluded that she be admitted for evaluation and treatment as an  inpatient.   PAST MEDICAL HISTORY:  1. Recurrent right breast cancer as above.  2. Hypothyroidism.  3. History of migraine headaches.   PAST SURGICAL HISTORY:  1. Status post right mastectomy with lymph node resection with TRAM     reconstruction by Renee Pain, M.D., in December of  2003.  2. Status post pilonidal cyst removal, remote.  3. Status post  inguinal hernia repair, remote.  4. Status post brachial cleft palate repair, remote.  5. Status post PAC placement.   MEDICATIONS:  1. Multivitamins daily.  2. Calcium plus D daily.  3. Coumadin 1 mg p.o. daily.  4. Tetrol LA p.r.n.  5. Albuterol inhaler p.r.n.   ALLERGIES:  SULFA.   REVIEW OF SYSTEMS:  Overall the patient feels debilitated.  Her fever  reached a temperature maximum of 100.6 degrees at the office.  She also has  chills and malaise.  No unexplained weight loss.  Her headaches are  intermittent, mostly frontal.  No double vision or seizures.  No sinus  drainage or sore throat.  No shortness of breath or cough.  No sputum  production or dyspnea on exertion.  No chest pain or palpitations.  No  syncope.  She complains of abdominal cramps and nausea.  No vomiting or  diarrhea.  She  did not have a bowel movement for the last three to four  days.  Previous to that, she denied any tarry stools or bright red blood per  rectum.  No dysuria or gross hematuria.  No DVT symptoms, numbness,  tingling, or peripheral edema.   FAMILY HISTORY:  With the exception of one aunt with breast cancer, the rest  of the family history is unremarkable and noncontributory.   SOCIAL HISTORY:  Married.  No children.  She works at [employer]. [employer].  She denies any tobacco or alcohol intake.   PHYSICAL EXAMINATION:  GENERAL APPEARANCE:  This is a well-developed, 12-  year-old, white female in significant discomfort secondary to nausea.  Alert  and oriented x 3.  VITAL SIGNS:  Blood pressure 106/72, pulse 91, respirations 20, temperature  100.6 degrees.  WEIGHT:  142 pounds.  HEENT:  Normocephalic and atraumatic.  PERRLA.  EOMI.  Sclerae anicteric.  Oral mucosa intact.  NECK:  Supple.  No JVD or lymphadenopathy.  CHEST:  Symmetrical on inspiration.  Port site without visible infection.  LUNGS:  Clear to auscultation bilaterally.  CARDIOVASCULAR:  Regular rate and rhythm with  a very soft murmur heard at  the fourth to fifth intercostal area just below the substernal border.  No  gallops.  ABDOMEN:  Soft.  Left quadrant tenderness, but no rebound tenderness.  Bowel  sounds x 4.  No palpable spleen or liver.  GENITOURINARY:  Deferred.  EXTREMITIES:  No cyanosis, clubbing, or edema.  NEUROLOGIC:  Normal gait, muscle strength, and DTRs.   LABORATORY DATA:  WBC 0.949, neutrophils 0.333, hemoglobin 11.6, hematocrit  36.1, platelets 154.  UA negative.  Urine culture and blood culture pending.   ASSESSMENT AND PLAN:  47. A 45 year old white female in significant discomfort secondary to fever     and neutropenia, who will be admitted for IV antibiotics which will     include cefepime 2 g q.8h., IV fluids, and neutropenic fever care.     Moreover, a chest x-ray will be performed in order to rule out pneumonia.     In addition, an abdominal x-ray will also be performed to rule out     obstruction versus neutropenic colitis.  Urine culture and blood cultures     are pending.  2. Breast cancer.  Day #15 for cycle #1 of AC.  3. Hypothyroidism.  4. Urine culture and blood culture pending.  Depending on the results, the     patient's antibiotic treatment will change or remain the same.   Izola Price Benay Spice, M.D., has seen and evaluated this patient prior to this  admission.  She will be located in room 253 at [place], [place] B. Benay Spice, M.D.    SW/MEDQ  D:  10/15/2002  T:  10/15/2002  Job:  [id number]   cc:   Tarri Fuller D. Young, M.D.  [place]. [place] [address]  Fax: [phone]   Blair Promise, M.D.  [address]  [address]  Fax: [place]. [place]

## 2010-12-28 NOTE — Op Note (Signed)
NAMEKAIDA, GAMES                         ACCOUNT NO.:  [id number]   MEDICAL RECORD NO.:  [id number]                   PATIENT TYPE:  OIB   LOCATION:  5739                                 FACILITY:  [facility]   PHYSICIAN:  Rudell Cobb. Young, M.D.                DATE OF BIRTH:  [birth year]   DATE OF PROCEDURE:  08/03/2002  DATE OF DISCHARGE:                                 OPERATIVE REPORT   PREOPERATIVE DIAGNOSIS:  Recurrent carcinoma of the right breast.   POSTOPERATIVE DIAGNOSIS:  Recurrent carcinoma of the right breast.   OPERATION:  Right total mastectomy with low axillary node dissection to be  followed by transverse rectus abdominis muscle reconstruction.   SURGEON:  Rudell Cobb. [name], M.D.   ASSISTANT:  Georgina Quint, M.D.   ANESTHESIA:  General.   OPERATIVE PROCEDURE:  After suitable general endotracheal anesthesia was  induced, the patient was placed in the supine position with both arms  extended on the arm board.  She was then prepped and draped from sternum to  pubis and from table top to table top.  She had undergone a right partial  mastectomy and sentinel node biopsy about five years ago and had developed a  recurrent carcinoma of the breast in her lumpectomy scar.  Prior to coming  to the operating room she had been marked by Dr. Stephanie Coup and we made a  transverse elliptical incision incorporating the nipple areolar complex and  the lumpectomy scar in the upper outer quadrant.  Superior, medial,  inferior, and lateral flaps were then dissected in standard fashion going  superiorly to the clavicle and medially to the sternum and inferiorly to the  rectus fascia and the inframammary fold and laterally to the latissimus  dorsi muscle.  We then removed the breast by dissecting from medial to  lateral incorporating the pectoralis fascia.  At the margin of the  pectoralis fascia, we incised along it and retracted it and entered the  axilla and then took out below lymph  nodes that were in the axilla  preserving the long thoracic and thoracodorsal nerves.  She had previously  had a negative sentinel  node at the time of her original tumor.  Following removal of the breast  from the lower axilla, we obtained good hemostasis.  We thoroughly irrigated  the field with saline.  Dr. Stephanie Coup was coming in to do TRAM reconstruction  which will be dictated in a separate note.                                               Rudell Cobb. [name], M.D.    PRY/MEDQ  D:  08/03/2002  T:  08/03/2002  Job:  [id number]   cc:   Dominica Severin B. Benay Spice, M.D.  [phone]  [place]  [phone]  Fax: Hoberg Sallye Lat, M.D.  [place]  [place] [address]  Fax: [phone]

## 2010-12-28 NOTE — Op Note (Signed)
NAME:  [name], [name]                         ACCOUNT NO.:  [id number]   MEDICAL RECORD NO.:  [id number]                   PATIENT TYPE:  AMB   LOCATION:  DSC                                  FACILITY:  [facility]   PHYSICIAN:  Aretha Parrot., M.D.         DATE OF BIRTH:  16-Nov-1965   DATE OF PROCEDURE:  02/28/2004  DATE OF DISCHARGE:                                 OPERATIVE REPORT   PREOPERATIVE DIAGNOSIS:  Area of fat necrosis, medial aspect of right  transverse rectus abdominis myocutaneous flap following previous breast  reconstruction for carcinoma of the breast, following a mastectomy.   OPERATION:  Excision of area of fat necrosis and secondary breast revision  reconstruction surgery.   SURGEON:  Erline Hau, M.D.   ANESTHESIA:  General endotracheal.   FINDINGS:  The patient had a previous right modified radical mastectomy with  TRAM flap reconstruction on the medial aspect.  Early after surgery had some  firmness and redness, and had been treated for an infection at one point.  There appeared to be a fat necrosis clinically, and by mammography and  ultrasound, and it was found clinically to be that at the time of surgery,  and the above surgical procedure is being carried.   DESCRIPTION OF PROCEDURE:  The patient was brought to the operating room and  marked off for the planned incision to be made, to mobilize the medial  aspect of the TRAM flap, following excision of the area of fat necrosis.  She was given a general endotracheal anesthetic, and prepped with Betadine  and draped about this area in a sterile fashion.  The incision was made, and  there was a considerable amount of scar tissue with surrounding erythema.  The dissection was continued down to what appeared to be areas of fat  necrosis with surrounding inflammatory areas which were debrided as  completely as possible.  Further curetting of some of this area was  performed, and any and all areas that  appeared to be communicating to this  area were opened up, trying to remove any obvious non-viable tissue. The  wound was irrigated with normal saline.  There was noted to be good  hemostasis.  The flap was then mobilized as medially as possible, and  sutured in place with interrupted subcutaneous #3-0 Monocryl, followed by a  running subcuticular #4-0 Monocryl.  Steri-Strips, Xeroform, fluffs, ABD and  a circumferential Ace bandage were applied.  The patient tolerated the procedure well, and was able to be discharged from  the operating room to the recovery room, and subsequently to be followed by  me as an outpatient.                                               Aretha Parrot., M.D.  HH/MEDQ  D:  02/28/2004  T:  02/28/2004  Job:  [id number]

## 2010-12-28 NOTE — Op Note (Signed)
[facility]  Patient:    [name], [name]                       MRN: [id number] Proc. Date: 10/10/00 Adm. Date:  [id number] Attending:  Earl Gala CC:         Wolfgang Phoenix. Oneida Alar, M.D.   Operative Report  PREOPERATIVE DIAGNOSIS:  Rule out recurrent breast cancer.  POSTOPERATIVE DIAGNOSIS:  Probably fibrotic breast tissue.  PROCEDURE:  Excision, right breast mass.  SURGEON:  Rudell Cobb. [name], M.D.  ANESTHESIA:  General.  DESCRIPTION OF PROCEDURE:  This patient had previously had a right partial mastectomy with lymph node dissection and radiation therapy for breast cancer. She has had a persistent nodular area between her partial mastectomy scar and the areolar margin for some times, which had looked okay on mammogram but was worrying her and bothering me.  I plan to excise it.  After suitable general anesthesia was induced, the patient was placed in a supine position and the right breast prepped and draped in the usual fashion. A curvilinear incision was outlined over the palpable nodular area at the 12 oclock position.  The area was infiltrated with 1% Xylocaine with adrenalin. The incision was made, and what appears to be fibrotic nodular breast tissue was excised.  The specimen was submitted to the pathologist for permanent sections.  Hemostasis obtained with the cautery.  Subcuticular closure with 4-0 Monocryl and Steri-Strips carried out.  Dressing applied.  The patient transferred to the recovery room in satisfactory condition, having tolerated the procedure well. DD:  10/10/00 TD:  10/11/00 Job: [id number]

## 2010-12-28 NOTE — Op Note (Signed)
   NAME:  [name], [name]                         ACCOUNT NO.:  [id number]   MEDICAL RECORD NO.:  [id number]                   PATIENT TYPE:  AMB   LOCATION:  [place]                                  FACILITY:  [facility]   PHYSICIAN:  Rudell Cobb. [name], M.D.                DATE OF BIRTH:  Jun 29, 1966   DATE OF PROCEDURE:  04/19/2003  DATE OF DISCHARGE:                                 OPERATIVE REPORT   PREOPERATIVE DIAGNOSIS:  Cancer of the breast.   POSTOPERATIVE DIAGNOSIS:  Cancer of the breast.   OPERATION/PROCEDURE:  Removal of Port-A-Cath.   SURGEON:  Rudell Cobb. [name], M.D.   ANESTHESIA:  MAC.   OPERATIVE PROCEDURE:  The patient was placed on the operating room table and  the left upper chest was prepped and draped in the usual fashion.  A 1%  Xylocaine was then infiltrated in the incision and the old Port-A-Cath  incision was incised and the catheter grasped and removed.  There was no  bleeding.  I divided the two sutures holding it in place and then opened up  the capsule and slid the Port-A-Cath out.  Hemostasis was obtained with the  cautery.  Subcuticular closure with 4-0 Monocryl was carried out.  Dermabond  was then used for the skin.  Dressing was applied.  The patient was  transferred to the recovery room in satisfactory condition having tolerated  the procedure well.                                                Rudell Cobb. [name], M.D.    PRY/MEDQ  D:  04/19/2003  T:  04/19/2003  Job:  [id number]

## 2010-12-28 NOTE — Discharge Summary (Signed)
[name], [name]                         ACCOUNT NO.:  [id number]   MEDICAL RECORD NO.:  [id number]                   PATIENT TYPE:  INP   LOCATION:  0273                                 FACILITY:  [facility]   PHYSICIAN:  [name], M.D.              DATE OF BIRTH:  09/20/1965   DATE OF ADMISSION:  10/15/2002  DATE OF DISCHARGE:  10/17/2002                                 DISCHARGE SUMMARY   DISCHARGE DIAGNOSES:  1. Febrile neutropenia.  2. Recurrent right breast cancer.     a. Initial diagnosis in 1998 status post CMF and radiation.     b. Recurrence in right breast status post mastectomy followed by        reconstruction in December 2003, no lymph node involvement, ER/PR        positive and HER-2-neu negative status post cycle #1 AC.  3. Hypothyroidism.  4. History of migraine headaches.  5. Status post Port-A-Cath placement on September 27, 2002.   CONSULTATIONS:  None.   PROCEDURES:  IV antibiotics.   HISTORY OF PRESENT ILLNESS:  The patient is a 45 year old woman who was  initially diagnosed with breast cancer in 1998.  She received CMF  chemotherapy and radiation.  In December 2003 she was found to have a  recurrence versus a new primary in the right breast and underwent a  mastectomy followed by reconstruction.  There was no lymph node involvement.  The tumor was ER/PR positive and HER-2-neu negative.  Prior to this  admission she had completed her first cycle of adjuvant AC chemotherapy, day  #15 at the time she had presented to the office on October 15, 2002.   On October 15, 2002 she had complaints of intermittent headache and  fever/chills.  CBC showed a hemoglobin of 11.6, white blood cell count 0.9,  ANC 0.3, and a platelet count of 154,000.  Urinalysis was negative.  Blood  cultures and urine cultures were obtained in the office.  She received a  dose of antibiotics in the office but ultimately required admission to the  hospital.   HOSPITAL COURSE:  She  was admitted with febrile neutropenia.  Blood cultures  and urine cultures had been obtained in the office.  Chest x-ray was  obtained upon admission and was negative for acute infiltrate.  She was  started on cefepime 2 grams IV q.8h.  This was continued throughout her  hospitalization.  Her fever improved over the next 24 hours.  She remained  afebrile for greater than 24 hours prior to her discharge home.  Her other  vital signs remained stable.  She received Neupogen 300 mcg on October 16, 2002.  One blood culture returned positive for gram positive rods.  This was  felt likely to be a contaminant.  We will follow up on the final report.  Blood counts improved with a white blood cell count of 3.2  and an ANC of 1.4  on October 17, 2002.  On October 17, 2002 the patient was felt to be stable for  discharge home with follow up as an outpatient.  She was discharged home on  Tequin 400 mg daily for seven days.   Due to complaints of some abdominal pain and nausea, an abdominal x-ray was  also obtained during her hospitalization.  This was normal.   LABORATORY DATA:  On October 17, 2002, hemoglobin 9.9; white count 3.2; ANC  1.4; platelets 172,000.   Radiology:  1. Chest x-ray:  Negative for acute infiltrate.  2. Abdominal x-ray:  Normal.   DISPOSITION AT DISCHARGE:  1. Condition:  Improved.  2. Activity:  No restrictions.  3. Diet:  No restrictions.  4. Discharge medications:  Tequin 400 mg daily for seven days.  The patient     was instructed to resume all other home medications.  5. Follow-up:  Dr. Gearldine Shown office will call the patient at home to     arrange her follow-up appointment.       Ned Card, N.P.                         [name], M.D.    LT/MEDQ  D:  10/21/2002  T:  10/21/2002  Job:  [id number]

## 2010-12-28 NOTE — H&P (Signed)
[name], [name]               ACCOUNT NO.:  [id number]   MEDICAL RECORD NO.:  [id number]          PATIENT TYPE:  AMB   LOCATION:  [place]                           FACILITY:  [facility]   PHYSICIAN:  Clarene Duke, M.D.DATE OF BIRTH:  July 10, 1966   DATE OF ADMISSION:  DATE OF DISCHARGE:                                HISTORY & PHYSICAL   CHIEF COMPLAINT:  Pelvic mass.   HISTORY OF PRESENT ILLNESS:  This is a 45 year old white female, gravida 2,  para 0-0-2-0, who has been experiencing what she thought were gas pains for  a few months.  The pain increased in the middle of October at which point  she had a CT scan on October 20, which revealed a right pelvic mass.  An  ultrasound on October 21 revealed a cystic and complex right pelvic mass.  She has possibly had a low-grade temperature of 99 to 100 with possible  chills and occasional nausea.  She has had normal urination except some  discomfort after urination.  She does have painful bowel movements.  She  also has dyspareunia.  Initial pelvic exam reveals some fullness in the  posterior cul-de-sac and she was tender bilaterally.  All options were  discussed with the patient and she initially tried Floxin 400 mg b.i.d. for  10 days in case this was some sort of infectious process.  She had a repeat  pelvic ultrasound performed on November 17 which reveals still a complex  mass that is slightly decreased in size.  Pelvis is otherwise normal.  She  is experiencing less pain but due to the persistent of this mass, she wants  to proceed with removal of this mass.  The patient also has a history of  breast cancer with recurrence and wishes to have her ovaries removed at the  same time and since we are removing her ovaries, we are going to proceed  with hysterectomy as well.   PAST OB HISTORY:  Two elective terminations. The second termination was  after her first diagnosis of breast cancer.   PAST MEDICAL HISTORY:  1.  Breast cancer in  1998, initially treated with lumpectomy, radiation      therapy, chemotherapy tamoxifen.  She had a recurrence in 2003 treated      with mastectomy and a TRAM flap also followed by chemotherapy and      tamoxifen.  2.  Hypothyroidism.  3.  Menopausal symptoms.   PAST SURGICAL HISTORY:  1.  Lumpectomy and mastectomy with TRAM flap.  2.  Right inguinal hernia.   ALLERGIES:  Sulfa which causes a rash.   CURRENT MEDICATIONS:  She is on tamoxifen and multiple vitamin and herbal  supplements which have been reviewed.   SOCIAL HISTORY:  She is married and denies significant alcohol, tobacco or  drug use.   FAMILY HISTORY:  No family history of GYN or colon cancer.   REVIEW OF SYSTEMS:  Otherwise negative.   PHYSICAL EXAMINATION:  VITAL SIGNS:  Weight is approximately 110 pounds.  Blood pressure 110/70, pulse 80.  GENERAL:  This is a well-developed  female in no acute distress.  NECK:  Supple without lymphadenopathy or thyromegaly.  LUNGS:  Clear to auscultation.  HEART:  Regular rate and rhythm without murmur.  ABDOMEN:  Soft, nondistended with mild tenderness of both lower quadrants,  right greater than left without masses.  She does have scars from her  previous TRAM flap.  EXTREMITIES:  No edema and are non-tender.  PELVIC:  External genitalia has no lesions.  On bimanual exam, she has an  anterior cervix that is slightly tender and still some fullness in the  posterior cul-de-sac and she is tender bilaterally.   ASSESSMENT:  Pelvic mass.  The etiology of this mass is uncertain.  Due to  its persistence, the patient does wish to proceed with surgical removal and  again due to her of breast cancer, she wishes to proceed with oophorectomy  and hysterectomy at the same time.  Risks of surgery including bleeding,  infection, and damage to surrounding organs have been discussed with the  patient.   PLAN:  Admit the patient for laparoscopy. First thing we will do is remove  this  adnexal mass and sent it for frozen section.  If it is benign, then we  will proceed with laparoscopic supracervical hysterectomy.  If it turns out  to be malignant, then she will have a laparotomy and staging procedure.  She  has received a preoperative bowel prep.  Preoperative urinalysis and urine  culture are normal.      Clarene Duke, M.D.  Electronically Signed     TDM/MEDQ  D:  07/24/2005  T:  07/24/2005  Job:  [id number]

## 2010-12-28 NOTE — Discharge Summary (Signed)
[name], [name]                         ACCOUNT NO.:  [id number]   MEDICAL RECORD NO.:  [id number]                   PATIENT TYPE:  OIB   LOCATION:  0539                                 FACILITY:  [facility]   PHYSICIAN:  Renee Pain, M.D.               DATE OF BIRTH:  12/03/1965   DATE OF ADMISSION:  08/03/2002  DATE OF DISCHARGE:  08/05/2002                                 DISCHARGE SUMMARY   ADMISSION DIAGNOSIS:  Recurrent right breast cancer.   DISCHARGE DIAGNOSIS:  Recurrent right breast cancer.   OPERATIONS PERFORMED:  1. Right mastectomy with low axillary lymph node dissection (Dr. [name]).  2. Immediate breast reconstruction with right transverse rectus abdominus     myocutaneous flap (Dr. Stephanie Coup).   CHIEF COMPLAINT:  Breast cancer is back in the right breast.   HISTORY OF PRESENT ILLNESS:  This is a 44 year old woman who developed right  breast carcinoma approximately five years ago.  She was treated with  lumpectomy, axillary lymph node dissection, chemotherapy, and radiation  therapy.  She did well for five years until recently was noted to have  suspicious area on her mammogram.  This was biopsied and returned invasive  ductal carcinoma.  The patient was advised that the only form of treatment  that is available would be a mastectomy.  The patient wishes to proceed with  mastectomy.  She desire elective breast reconstruction with a TRAM flap.  She was advised in the office regarding potential risks of the TRAM flap.   PAST MEDICAL HISTORY:  Significant for breast cancer.  The patient also  suffers from hypothyroidism.   PAST SURGICAL HISTORY:  Right lumpectomy and sentinel node mapping.   MEDICATIONS:  Synthroid 0.125 mg a day.   ALLERGIES:  SULFA.   SOCIAL HISTORY:  The patient denies use of alcohol or tobacco.  She is  married.  She is nulliparous.   FAMILY HISTORY:  Negative for breast cancer.   PHYSICAL EXAMINATION:  Please see admission  H&P for pertinent physical  findings.   ADMISSION LABORATORY DATA:  Included a CBC which showed hemoglobin 12.6,  hematocrit of 37.8, and a white blood count of 7.5.  She had normal  urinalysis.  Her electrolytes were also normal.   Chest x-ray revealed no evidence of acute disease or interval changes of the  lungs.  The right breast appeared to be less prominent that the left.   Cardiology:  No cardiogram was performed on the patient.   HOSPITAL COURSE:  On the day of admission, the patient was taken to the  operating room where she underwent right mastectomy followed by immediate  breast reconstruction with a TRAM flap.   Postoperative course has been uneventful other than some mild nausea which  responded well to Phenergan.   On the day of discharge, all dressings were removed.  The TRAM flap is  viable.  There is some mild venous congestion in the medial one-fourth of  the TRAM flap skin paddle.  There is excellent capillary refill although  slightly brisk.  The flap itself is warm and soft and shows no evidence of  fatty necrosis.  Symmetry appears to be acceptable.  Abdominal area shows  now evidence of infection, seroma, or hematoma.  The umbilicus is viable.  The patient is ambulating with assistance.  She is voiding without  difficulty.  He pain is under control with Tylox 1 to 2 every 4 hours as  needed for pain.  She is tolerating a regular diet and drinking fluids well.   She is being discharged at this time with prescriptions for Tylox for pain  and Phenergan suppositories in case she sufferers from nausea.  She does  have a low-grade fever of 100.6.  This was felt to be secondary to  atelectasis.  The patient is finding it difficult to take deep breaths due  to the discomfort.  She was given incentive spirometer to help her with her  breathing.   Followup will be provided in my office in four days for removal of drains if  ready.  The patient was instructed to call  me if there are any questions or  concerns in the intervening time.  Discharge hematocrit was 29.  The  patient's questions were answered.  She was given discharge instructions on  how to take care of her drains and record the drain output.  Dressings were  supplied to the patient.                                                Renee Pain, M.D.    WBB/MEDQ  D:  08/05/2002  T:  08/06/2002  Job:  [id number]

## 2010-12-28 NOTE — Op Note (Signed)
   NAMEGAVIN, [name]                         ACCOUNT NO.:  [id number]   MEDICAL RECORD NO.:  [id number]                   PATIENT TYPE:  AMB   LOCATION:  [place]                                  FACILITY:  [facility]   PHYSICIAN:  Rudell Cobb. [name], M.D.                DATE OF BIRTH:  07/17/1966   DATE OF PROCEDURE:  09/27/2002  DATE OF DISCHARGE:                                 OPERATIVE REPORT   PREOPERATIVE DIAGNOSIS:  Recurrent stage I carcinoma of the right breast.   POSTOPERATIVE DIAGNOSIS:  Recurrent stage I carcinoma of the right breast  with dysplastic nevus.   OPERATION:  1. Port-A-Cath insertion.  2. Excision of dysplastic nevus.   SURGEON:  Rudell Cobb. [name], M.D.   ANESTHESIA:  MAC.   OPERATIVE PROCEDURE:  The patient was placed on the operating table with a  roll between the shoulders and left upper chest and neck prepped and draped  in usual fashion.  Under a local anesthesia, a left subclavian puncture was  carried out without difficulty and a guidewire inserted and proper  positioning confirmed by fluoroscopy.   We then made an incision on the upper part of the chest wall and developed a  pocket for the implantable port.   I tunneled between the subclavian site and the implantable port and passed  the catheter through this and then connected the catheter to the Export.  The system was flushed.   We anchored the port in the pocket with two 2-0 Prolene sutures.  Catheter  tip was trimmed to the appropriate length to go to the 4th interspace.  Dilator and peel-away sheath were then passed over the wire was removed,  followed by the dilator and the catheter passed through the peel-away sheath  and then it was removed.   Fluoroscopy confirmed that there was no kinking in the system and that the  catheter tip was in a proper position.   Incisions were closed with 3-0 Vicryl and subcuticular 4-0 Monocryl and  Steri-Strips.  System was then fully heparinized.   We then  prepped around the umbilicus and the small dysplastic nevus in the  umbilicus was excised and a subcuticular closure of a single suture of 4-0  Monocryl carried out.  Dressings were then applied and the patient  transferred to the recovery room in satisfactory condition, having tolerated  the procedure well.                                               Rudell Cobb. [name], M.D.    PRY/MEDQ  D:  09/27/2002  T:  09/27/2002  Job:  [id number]

## 2010-12-28 NOTE — Op Note (Signed)
[name], [name] NO.:  [id number]   MEDICAL RECORD NO.:  [id number]          PATIENT TYPE:  OBV   LOCATION:  9319                          FACILITY:  [facility]   PHYSICIAN:  Clarene Duke, M.D.DATE OF BIRTH:  Jan 03, 1966   DATE OF PROCEDURE:  07/25/2005  DATE OF DISCHARGE:  07/25/2005                                 OPERATIVE REPORT   PREOPERATIVE DIAGNOSIS:  Pelvic mass and history of breast cancer. Skin tag.   POSTOPERATIVE DIAGNOSIS:  Left pelvic mass, abdominal adhesions, and history  of breast cancer.  Skin tag.   PROCEDURES:  Laparoscopic supracervical hysterectomy with bilateral salpingo-  oophorectomy, adhesiolysis, and removal of a left pelvic mass. Removal of a  left buttock skin tag.   SURGEON:  Clarene Duke, M.D.   ASSISTANT:  Paula Compton, M.D.   ANESTHESIA:  General endotracheal tube.   SPECIMENS:  Uterus, tubes, ovaries and left pelvic mass.   ESTIMATED BLOOD LOSS:  50 mL.   COMPLICATIONS:  None   FINDINGS:  There were omental adhesions to the anterior abdominal wall.  There was a pelvic mass on the left posterior pelvis and the right ovary was  enlarged and cystic and her anatomy was otherwise normal with adhesions  around the left ovary as well.   PROCEDURE IN DETAIL:  The patient was taken to the operating room and placed  in the dorsosupine position. General anesthesia was induced and she was  placed in mobile stirrups. Abdomen, perineum and vagina were then prepped  and draped in the usual sterile fashion. Skin was infiltrated just above the  umbilicus at her previous scar with quarter percent Marcaine and a 0.5 cm  incision was made. I attempted to insert the Veress needle into the  peritoneal cavity but there was significant resistance to insertion. The 5  mm XL trocar was then used with the scope inside it to insert the 5 mm port  directly into the peritoneal cavity. CO2 gas was then insufflated and  placement  confirmed. Significant adhesions were noted in the abdominal  cavity and initially visualization was difficult. However, I was able to  obtain good visualization. A 12 mm port was then placed on the left side  under direct visualization. Using the harmonic scalpel omental adhesions to  the anterior abdominal wall and the right lower abdominal wall were taken  down with the harmonic scalpel Ace device. The 5 mm trocar was then placed  under direct visualization of the right side. Inspection then revealed the  right ovary and tube to be freely mobile, but the ovary was enlarged and  cystic. The left fallopian tube was easily identified but the left ovary  appeared to be adhesed underneath the left tube. There was a dark appearing  mass in the left posterior cul-de-sac. It appeared to be separate from the  bowel. Bowels were pushed up into the abdomen as the patient was placed in  Trendelenburg. Using a grasper and the harmonic scalpel, this mass was freed  from all surrounding structures, careful to avoid bowel and ureter. The mass  was  freed and placed in the cul-de-sac for later removal. The right tube and  ovary were then freed from surrounding tissues using the harmonic scalpel to  come through the infundibulopelvic ligament, mesosalpinx and then across the  tube and the utero-ovarian ligament. The right tube and ovary and the pelvic  mass were then placed in an Endobag which was passed through the 12 mm port.  This was then brought out through the skin incision as cyst ruptured while  it was being pulled through the incision but the fluid stayed in the bag.  This was sent for frozen section. The trocar was reintroduced through this  incision. We then proceeded with the laparoscopic hysterectomy. We first  started on the left side. The left tube was grasped and the left  infundibulopelvic ligament, mesosalpinx, round ligament and broad ligament  were taken down with the harmonic scalpel.  The uterine artery was  skeletonized anteriorly and posteriorly. Peritoneum was incised across the  anterior portion of the uterus to free the bladder inferiorly. The uterine  artery was then taken down once it was skeletonized with the harmonic  scalpel on minimum power. This achieved good separation and fair hemostasis.  Attention was turned to the right side where the right round ligament, broad  ligament was taken down with the harmonic scalpel. The right uterine artery  was then skeletonized anteriorly and posteriorly and the anterior peritoneum  incised across the anterior portion uterus and connected to the incision  coming from the left side to push the bladder further inferior. Once the  uterine artery was skeletonized it was taken down with harmonic scalpel on  minimum power. We then started amputating the uterus from the cervix. This  done with a drill, clamp and cut technique with maximum power using the  harmonic scalpel Ace staying above the uterosacral ligaments. This was first  done from right side and then from the left side. On the left side I  did  have to achieve further hemostasis on the left uterine artery and this was  obtained. Uterus was then freed from the cervix and placed in the cul-de-sac  for later removal. Bleeding from the left side of the cervical stump was  controlled with bipolar cautery. The entire cervical bed was coagulated with  bipolar cautery and adequate hemostasis was achieved. Pelvis was copiously  irrigated and again hemostasis was assured. The uterus with the attached  left tube and ovary was then removed through the morcellator which was  placed through the 12 mm incision on the left side. This was done with good  visualization and good results and all pieces were removed. Inspection of  the pelvis again assured hemostasis and ureters peristalsing and inferior to any incision lines. Intercede was then placed over the cervical stump. All   trocars were then removed under direct visualization and gas allowed to  deflate from the abdomen. The 12 mm incision was closed with of 0 Vicryl  through the fascia. The skin incisions were closed with interrupted  subcuticular sutures of 4-0 Vicryl followed by Dermabond.   The legs were then elevated in stirrups. A skin tag was noticed on the left  medial buttock. This was removed sharply and bleeding controlled with a  figure-of-eight suture of 3-0 Vicryl. A Band-Aid was placed over this. The  Foley catheter was then removed. The patient was taken down from stirrups.  She was extubated in the operating room after tolerating the procedure well  and was taken  to recovery room in stable condition. Counts were correct x2,  she received Ancef 1 gram prior to the procedure, she had PAS hose on  throughout procedure.      Clarene Duke, M.D.  Electronically Signed     TDM/MEDQ  D:  07/25/2005  T:  07/26/2005  Job:  [id number]

## 2010-12-28 NOTE — Op Note (Signed)
[name], [name]                         ACCOUNT NO.:  [id number]   MEDICAL RECORD NO.:  [id number]                   PATIENT TYPE:  OIB   LOCATION:  5739                                 FACILITY:  [facility]   PHYSICIAN:  Renee Pain, M.D.               DATE OF BIRTH:  21-Jun-1966   DATE OF PROCEDURE:  DATE OF DISCHARGE:                                 OPERATIVE REPORT   PREOPERATIVE DIAGNOSES:  1. Recurrent right breast cancer.  2. Acquired absence, right breast, following mastectomy.   POSTOPERATIVE DIAGNOSES:  1. Recurrent right breast cancer.  2. Acquired absence, right breast, following mastectomy.   OPERATION PERFORMED:  Immediate breast reconstruction using a right  ipsilateral transverse rectus abdominous myocutaneous flap to reconstruct  absent right breast.   SURGEON:  Dr. Stephanie Coup.   FIRST ASSISTANT:  Dr. Jerre Simon, R.N.F.A.   ANESTHESIA:  General endotracheal anesthesia.   INDICATIONS FOR PROCEDURE:  This is a 45 year old woman with recurrent  breast cancer in the right breast.  She has previously undergone radiation  therapy.  She has been advised to undergo mastectomy which the patient  wishes to proceed with.  She understands the risks of the TRAM flap  including partial or complete loss of the flap, asymmetry, being made too  large, not being made large enough, bleeding requiring transfusion,  hematoma, seroma, fat necrosis, abdominal wound healing problems, abdominal  hernias, unsightly scarring in both the chest and the abdomen, umbilicus  being off the midline, vascular compromise to the umbilicus, prolonged and  the need for secondary surgery.  Despite of these and other risks discussed  with the patient, she wished to proceed with the operation.  She has been  informed that the amount of tissue in her abdomen will not match the  opposite breast because it is less than is required to match the left  breast.   DESCRIPTION OF  PROCEDURE:  With the patient in the standing position, and  prior to being taken back to the operating room, skin marks were placed and  outlined the dimensions of the TRAM flap.  Dimensions of the right breast  which is to be removed, were also drawn on the chest.  The patient was then  taken to the operating room under the direction of Dr. [name].  Dr.  [name] proceeded with mastectomy which is dictated under separate cover.  At  the conclusion of the mastectomy, I was summoned to the operating room.  The  mastectomy defect was inspected.  A 10 mm Blake drain was placed in the  lateral axillary gutter and brought through a separate stay incision.  A  tunnel was begun in the inferomedial aspect of the mastectomy dissection  carrying it inferiorly over the right rectus fascia for a distance of  approximately 6-7 cm.  A circular incision was made around the umbilicus and  the umbilicus was  dissected away from the surrounding tissue, leaving a  healthy cuff of fatty tissue on the umbilicus stalk to ensure vascular  integrity.  The upper incision of the skin panel was made and deepened  through the skin, subcutaneous tissue until reaching the anterior abdominal  wall fascia.  The abdominal skin flap was now elevated up to the level of  the costal cartilages bilaterally and the xiphoid in the midline.  I  connected with the previously dissected tunnel going to the mastectomy site.  The patient was temporarily placed in a semi-Fowler's position to ensure  that I could close her without undue tension.  Once I was certain of this,  she was returned to a supine position.  The lower limb of the skin panel of  TRAM was now made and deepened through the skin and subcutaneous tissue  until reaching the anterior abdominal wall fascia.  I opted to use the right  rectus muscle.  The left corner of the skin panel was elevated off of the  anterior abdominal wall fascia beginning laterally and elevating  it until  reaching slightly to the right of the midline.  The right limb of the skin  panel was elevated until reaching the lateral row of the rectus perforators  which was approximately 3 cm medial to the lateral border of the rectus  fascia.  Two parallel incisions were made in the anterior rectus fascia,  beginning at the costal cartilage superiorly.  These incisions were  separated by approximately 2 cm.  The medial incision was continued  inferiorly, skirting along the medial connection of the skin panel to the  right rectus fascia.  At the inferior connection of the skin panel, the  incision in the rectus fascia turned laterally until reaching the lateral  corner of the inferior connection of the skin panel to the anterior rectus  fascia.  The lateral rectus fascial incision was continued inferiorly,  skirting along the inferior border to the skin panel connected to the rectus  fascia until reaching the previously placed rectus incision in the inferior  lateral corner of the fascia.  The rectus fascia with the exception of the  strip in the middle over the rectus muscle was now elevated off of the  lateral and medial sections of the rectus muscle with care being taken to  preserve as much of the tendinous inscription anatomy as possible.  Once the  entire anterior rectus muscle had been visualized, the deep portion of the  rectus muscle was dissected off of the posterior rectus fascia.  The deep  anterior epigastric vessels were visualized.  They were dissected out of  their anatomic bed and clipped with hemoclips and divided between hemoclips.  The muscle was divided at the level of the arcuate line.  I opted to remove  the left lateral corner of the skin panel prior to rotating the muscle up to  the right chest.  At the time of removing it, there was actually quite good  bleeding coming from the left side of the TRAM flap.  The mastectomy site was inspected for hemostasis before  rotating the skin panel superiorly.  Once hemostasis had been ensured, the pocket was irrigated with saline  irrigation.  The TRAM flap was now tunneled beneath the previously placed  tunnel, up to the mastectomy site and temporarily stapled into position.  The abdominal wound was copiously irrigated with saline irrigation.  Hemostasis was meticulously accomplished.  The anterior rectus fascia was  closed using multiple interrupted buried figure-of-eight sutures, beginning  superiorly and carrying the closure to the umbilicus.  The fascia below the  umbilicus was closed, uniting both the posterior and the anterior rectus  fascia.  Once the closure had met around the location of the umbilicus, it  was completely closed.  Marlex mesh, cut to a dimension of approximately 8  inches x 4 inches, was placed over my suture line closure to act as an on-  lay reinforcing mesh.  It was fixed in position using a running 2-0 Monocryl  suture, beginning inferiorly and carrying it superiorly.  Opening was made  in the mesh and the umbilicus was brought through the opening.  The patient  was positioned in a semi-Fowler's position with the back elevated to 30  degrees and the knees flexed. The abdominal skin incision was now closed in  the midline using interrupted 2-0 Vicryl sutures.  Prior to closing the  entire wound, a new opening for the umbilicus was made and the umbilicus was  brought through this new opening in the skin.  The umbilicus was fixed into  its new opening using multiple interrupted 4-0 Monocryl sutures for the  dermis.  The abdominal skin incision was closed using multiple running 2-0  and 3-0 Vicryl suture for the dermis.  Steri-Strips were used to unite the  skin edges.  The skin panel of the TRAM appeared very healthy with excellent  capillary refill.  The upper portion of the skin panel was placed under the  superior breast flap.  The portion of the skin panel which rested  underneath  the skin flap of the breast was marked and de-epithelialized.  The de-  epithelialized skin panel was now fixed in position at the upper limits of  the dissection using multiple interrupted 3-0 Vicryl sutures, suspending the  skin panel from the superior limits of the mastectomy dissection.  The  inferior cut edge of the skin panel fit nicely to the cut edge of the  inferior breast flap.  I buried a portion of the de-epithelialized skin  panel in the inferomedial portion of the reconstructed breast.  The  remainder of the lateral 2/3 of the skin panel fit end-to-end of the cut  edge of the breast flap.  There was some paucity of tissue in the lower  portions of the breast but this was due to the fact that the patient was  nulliparous and did not have an adequate amount of skin panel to reconstruct  the entire breast.  The skin panel skin edge was now united to the edge of  the superior breast flap using a running 3-0 Vicryl suture subcuticularly placed.  The inferior cut edge fo the skin panel was united to the inferior  breast flap using interrupted 3-0 Vicryl sutures to the dermis followed by  running 3-0 Vicryl subcuticular.  At the conclusion of the procedure, the  capillary refill on the flap was excellent.  The patient tolerated the  entire procedure well.  Estimated blood loss from my part was approximately  300 cc.  The patient's chest was cleansed of all Betadine and dried blood,  and dried.  Steri-Strips were placed along the incision line.  It should be  mentioned that two Blake drains were placed in the abdominal wound and  brought up through separate stab incisions prior to closing the abdominal  incision.  Light dressings were placed in the abdominal incision and over  the breast.  Patient  was awakened, extubated and transported to the recovery  room in satisfactory condition.  She remained in the position she was placed  in prior to the abdominal closure, with the  back and the knees flexed.                                               Renee Pain, M.D.    WBB/MEDQ  D:  08/03/2002  T:  08/04/2002  Job:  [id number]   cc:   Rudell Cobb. Young, M.D.  [address] [place]  [place] [address]  Fax: [phone]

## 2011-02-04 ENCOUNTER — Encounter (HOSPITAL_BASED_OUTPATIENT_CLINIC_OR_DEPARTMENT_OTHER): Payer: 59 | Admitting: Oncology

## 2011-02-05 ENCOUNTER — Other Ambulatory Visit: Payer: Self-pay | Admitting: Oncology

## 2011-02-05 DIAGNOSIS — Z9010 Acquired absence of unspecified breast and nipple: Secondary | ICD-10-CM

## 2011-02-21 ENCOUNTER — Other Ambulatory Visit: Payer: Self-pay | Admitting: Oncology

## 2011-02-21 DIAGNOSIS — Z853 Personal history of malignant neoplasm of breast: Secondary | ICD-10-CM

## 2011-02-27 ENCOUNTER — Ambulatory Visit
Admission: RE | Admit: 2011-02-27 | Discharge: 2011-02-27 | Disposition: A | Discharge status: Home / Self Care | Payer: 59 | Source: Ambulatory Visit | Attending: Oncology | Admitting: Oncology

## 2011-02-27 DIAGNOSIS — Z853 Personal history of malignant neoplasm of breast: Secondary | ICD-10-CM

## 2011-03-30 ENCOUNTER — Emergency Department (HOSPITAL_COMMUNITY)
Admission: EM | Admit: 2011-03-30 | Discharge: 2011-03-30 | Disposition: A | Discharge status: Home / Self Care | Payer: 59 | Attending: Emergency Medicine | Admitting: Emergency Medicine

## 2011-03-30 ENCOUNTER — Emergency Department (HOSPITAL_COMMUNITY): Payer: 59

## 2011-03-30 DIAGNOSIS — R1110 Vomiting, unspecified: Secondary | ICD-10-CM | POA: Insufficient documentation

## 2011-03-30 DIAGNOSIS — N201 Calculus of ureter: Secondary | ICD-10-CM | POA: Insufficient documentation

## 2011-03-30 DIAGNOSIS — R1032 Left lower quadrant pain: Secondary | ICD-10-CM | POA: Insufficient documentation

## 2011-03-30 LAB — URINE MICROSCOPIC-ADD ON

## 2011-03-30 LAB — BASIC METABOLIC PANEL
BUN: 15 mg/dL (ref 6–23)
Calcium: 9.1 mg/dL (ref 8.4–10.5)
Chloride: 106 mEq/L (ref 96–112)
Creatinine, Ser: 0.69 mg/dL (ref 0.50–1.10)
GFR calc Af Amer: >60 mL/min (ref >60)
GFR calc non Af Amer: >60 mL/min (ref >60)

## 2011-03-30 LAB — DIFFERENTIAL
Eosinophils Absolute: 0.1 10*3/uL (ref 0.0–0.7)
Eosinophils Relative: 1 % (ref 0–5)
Lymphocytes Relative: 8 % — ABNORMAL LOW (ref 12–46)
Lymphs Abs: 0.8 10*3/uL (ref 0.7–4.0)
Monocytes Absolute: 0.4 10*3/uL (ref 0.1–1.0)
Monocytes Relative: 4 % (ref 3–12)

## 2011-03-30 LAB — CBC
HCT: 35.2 % — ABNORMAL LOW (ref 36.0–46.0)
MCH: 26.9 pg (ref 26.0–34.0)
MCHC: 31.8 g/dL (ref 30.0–36.0)
MCV: 84.6 fL (ref 78.0–100.0)
Platelets: 166 10*3/uL (ref 150–400)
RDW: 12.7 % (ref 11.5–15.5)
WBC: 9.4 10*3/uL (ref 4.0–10.5)

## 2011-03-30 LAB — URINALYSIS, ROUTINE W REFLEX MICROSCOPIC
Bilirubin Urine: NEGATIVE
Ketones, ur: NEGATIVE mg/dL
Nitrite: NEGATIVE
Urobilinogen, UA: 0.2 mg/dL (ref 0.0–1.0)
pH: 6.5 (ref 5.0–8.0)

## 2011-03-30 LAB — POCT PREGNANCY, URINE: Preg Test, Ur: NEGATIVE

## 2011-03-30 MED ORDER — IOHEXOL 300 MG/ML  SOLN
80.0000 mL | Freq: Once | INTRAMUSCULAR | Status: AC | PRN
  Administered 2011-03-30: 80 mL via INTRAVENOUS

## 2011-05-07 ENCOUNTER — Other Ambulatory Visit: Payer: Self-pay | Admitting: Family Medicine

## 2011-05-08 NOTE — Telephone Encounter (Signed)
Refill request

## 2011-05-31 ENCOUNTER — Inpatient Hospital Stay: Admission: RE | Admit: 2011-05-31 | Payer: 59 | Source: Ambulatory Visit

## 2011-06-24 ENCOUNTER — Other Ambulatory Visit: Payer: Self-pay | Admitting: Family Medicine

## 2011-06-25 NOTE — Telephone Encounter (Signed)
Refill request

## 2011-07-16 ENCOUNTER — Ambulatory Visit
Admission: RE | Admit: 2011-07-16 | Discharge: 2011-07-16 | Disposition: A | Discharge status: Home / Self Care | Payer: 59 | Source: Ambulatory Visit | Attending: Oncology | Admitting: Oncology

## 2011-07-16 DIAGNOSIS — Z9010 Acquired absence of unspecified breast and nipple: Secondary | ICD-10-CM

## 2011-07-24 ENCOUNTER — Telehealth: Payer: Self-pay | Admitting: Oncology

## 2011-07-24 NOTE — Telephone Encounter (Signed)
lmonvm advising the pt of her r/s dec appt to feb 2013

## 2011-07-25 ENCOUNTER — Ambulatory Visit: Payer: 59 | Admitting: Oncology

## 2011-08-30 ENCOUNTER — Ambulatory Visit: Payer: Self-pay

## 2011-08-30 ENCOUNTER — Other Ambulatory Visit: Payer: Self-pay | Admitting: Occupational Medicine

## 2011-08-30 DIAGNOSIS — M542 Cervicalgia: Secondary | ICD-10-CM

## 2011-09-16 ENCOUNTER — Ambulatory Visit (HOSPITAL_BASED_OUTPATIENT_CLINIC_OR_DEPARTMENT_OTHER): Payer: 59 | Admitting: Oncology

## 2011-09-16 VITALS — BP 130/88 | HR 61 | Temp 97.7°F | Ht 65.5 in | Wt 144.8 lb

## 2011-09-16 DIAGNOSIS — C50919 Malignant neoplasm of unspecified site of unspecified female breast: Secondary | ICD-10-CM

## 2011-09-16 NOTE — Progress Notes (Signed)
OFFICE PROGRESS NOTE   INTERVAL HISTORY:   She returns as scheduled. She continues with Femara. She denies hot flashes and arthralgias. She reports tenderness over the sternum. A mammogram on 07/17/2011 was negative. She runs approximately 40 miles per week. She had a kidney stone in August of 2012.  Objective:  Vital signs in last 24 hours:  Blood pressure 130/88, pulse 61, temperature 97.7 F (36.5 C), temperature source Oral, height 5' 5.5" (1.664 m), weight 144 lb 12.8 oz (65.681 kg).    HEENT: Neck without mass Lymphatics: No cervical, supraclavicular, or axillary nodes Resp: Lungs clear bilaterally Cardio: Regular rate and rhythm GI: No hepatomegaly Vascular: No leg edema  Breasts: Status post right mastectomy with a TRAM reconstruction. There is firm tissue at the medial aspect of the TRAM. Left breast without mass. No evidence for chest wall tumor recurrence. Muscle skeletal: Tenderness over the costosternal junction on the right greater than left. No discrete mass    Medications: I have reviewed the patient's current medications.  Assessment/Plan: [name] was diagnosed with right-sided breast cancer in November 1998.  She developed a right breast recurrence in December 2003 and underwent a right mastectomy/TRAM reconstruction.  She completed adjuvant AC and Taxol chemotherapy.  She took tamoxifen from August 2004 through July 2009.  She began Femara on 04/12/2008.  The plan is to continue Femara for 5 years.     Disposition:  She remains in clinical remission from breast cancer. She will be scheduled for a left breast ultrasound in July of 2013. She will return for an office visit in 6 months. She will continue Femara.   Electa Sniff, MD  09/16/2011  2:23 PM

## 2012-02-28 ENCOUNTER — Other Ambulatory Visit: Payer: Self-pay

## 2012-03-03 ENCOUNTER — Other Ambulatory Visit: Payer: Self-pay | Admitting: *Deleted

## 2012-03-03 MED ORDER — LETROZOLE 2.5 MG PO TABS: 2.5000 mg | ORAL_TABLET | Freq: Every day | ORAL | Status: DC

## 2012-03-03 NOTE — Telephone Encounter (Signed)
Call from pt requesting refill of Femara. OK, per Dr. Benay Spice. Pt reports she started a new job and needs to r/s this week's appt to August. Needs early AM or late afternoon.

## 2012-03-04 ENCOUNTER — Telehealth: Payer: Self-pay | Admitting: Oncology

## 2012-03-04 NOTE — Telephone Encounter (Signed)
Per 7/23 r/s 7/26 appt to 8/20 @ 8:45 am. S/w pt today she is aware of change and has new d/t for 8/20 @ 8:45 am.

## 2012-03-06 ENCOUNTER — Ambulatory Visit: Payer: Self-pay | Admitting: Oncology

## 2012-03-20 ENCOUNTER — Ambulatory Visit
Admission: RE | Admit: 2012-03-20 | Discharge: 2012-03-20 | Disposition: A | Discharge status: Home / Self Care | Payer: PRIVATE HEALTH INSURANCE | Source: Ambulatory Visit | Attending: Oncology | Admitting: Oncology

## 2012-03-20 DIAGNOSIS — C50919 Malignant neoplasm of unspecified site of unspecified female breast: Secondary | ICD-10-CM

## 2012-03-31 ENCOUNTER — Telehealth: Payer: Self-pay | Admitting: Internal Medicine

## 2012-03-31 ENCOUNTER — Encounter: Payer: Self-pay | Admitting: Gastroenterology

## 2012-03-31 ENCOUNTER — Telehealth: Payer: Self-pay | Admitting: *Deleted

## 2012-03-31 ENCOUNTER — Ambulatory Visit (HOSPITAL_BASED_OUTPATIENT_CLINIC_OR_DEPARTMENT_OTHER): Payer: PRIVATE HEALTH INSURANCE | Admitting: Oncology

## 2012-03-31 VITALS — BP 107/75 | HR 61 | Temp 98.5°F | Resp 18 | Ht 65.5 in | Wt 135.9 lb

## 2012-03-31 DIAGNOSIS — Z9010 Acquired absence of unspecified breast and nipple: Secondary | ICD-10-CM

## 2012-03-31 DIAGNOSIS — C50919 Malignant neoplasm of unspecified site of unspecified female breast: Secondary | ICD-10-CM

## 2012-03-31 DIAGNOSIS — M8580 Other specified disorders of bone density and structure, unspecified site: Secondary | ICD-10-CM

## 2012-03-31 DIAGNOSIS — K625 Hemorrhage of anus and rectum: Secondary | ICD-10-CM

## 2012-03-31 NOTE — Telephone Encounter (Signed)
appts made and printed for pt aom °

## 2012-03-31 NOTE — Telephone Encounter (Signed)
Pt returned call, she has been losing weight intentionally. She states she has been running about 30 miles/ week. Pt reports she is due to have her thyroid levels checked, she will contact her PCP for this.

## 2012-03-31 NOTE — Telephone Encounter (Signed)
Called pt to follow up on weight loss. Dr. Benay Spice wants to know if this was intentional. Left message for her to call office.

## 2012-03-31 NOTE — Progress Notes (Signed)
   [facility]    OFFICE PROGRESS NOTE   INTERVAL HISTORY:   She returns as scheduled. She continues to have "soreness "over the sternum. Good appetite and energy level. She continues to exercise regularly. She is taking Femara.  She reports intermittent bleeding with firm bowel movements for years. There is a family history of colon polyps.  A left breast ultrasound on 03/20/2012 was normal  Objective:  Vital signs in last 24 hours:  Blood pressure 107/75, pulse 61, temperature 98.5 F (36.9 C), temperature source Oral, resp. rate 18, height 5' 5.5" (1.664 m), weight 135 lb 14.4 oz (61.644 kg).    HEENT: Neck without mass Lymphatics: No cervical, supraclavicular, or axillary nodes Resp: end inspiratory coarse rhonchi at the left base, no respiratory distress, good air movement bilaterally Cardio: Regular rate and rhythm GI: No hepatomegaly, nontender Vascular: No leg edema Breasts: Status post right mastectomy with a TRAM reconstruction. There is firm tissue at the medial aspect of the TRAM. Left breast without mass. No evidence for chest wall tumor recurrence. Musculoskeletal: There is tenderness at the costosternal junction diffusely on the right greater than left.  Lab Results:  Lab Results  Component Value Date   WBC 9.4 03/30/2011   HGB 11.2* 03/30/2011   HCT 35.2* 03/30/2011   MCV 84.6 03/30/2011   PLT 166 03/30/2011      Medications: I have reviewed the patient's current medications.  Assessment/Plan: Ms. Szatkowski was diagnosed with right-sided breast cancer in November 1998. She developed a right breast recurrence in December 2003 and underwent a right mastectomy/TRAM reconstruction. She completed adjuvant AC and Taxol chemotherapy. She took tamoxifen from August 2004 through July 2009. She began Femara on 04/12/2008. The plan is to continue Femara for 5 years. She remains in clinical remission.  Disposition:  She will be scheduled for a mammogram  in December of 2013. She will be scheduled for a bone density scan and office visit in February of 2014.  We will make a referral to gastroenterology for evaluation of the rectal bleeding.   [name], MD  03/31/2012  9:35 AM

## 2012-04-22 ENCOUNTER — Encounter: Payer: Self-pay | Admitting: Family Medicine

## 2012-04-22 ENCOUNTER — Ambulatory Visit (INDEPENDENT_AMBULATORY_CARE_PROVIDER_SITE_OTHER): Payer: PRIVATE HEALTH INSURANCE | Admitting: Family Medicine

## 2012-04-22 VITALS — BP 113/79 | HR 60 | Ht 65.75 in | Wt 136.0 lb

## 2012-04-22 DIAGNOSIS — E039 Hypothyroidism, unspecified: Secondary | ICD-10-CM | POA: Insufficient documentation

## 2012-04-22 DIAGNOSIS — M25559 Pain in unspecified hip: Secondary | ICD-10-CM

## 2012-04-22 DIAGNOSIS — IMO0002 Reserved for concepts with insufficient information to code with codable children: Secondary | ICD-10-CM

## 2012-04-22 DIAGNOSIS — N3941 Urge incontinence: Secondary | ICD-10-CM

## 2012-04-22 DIAGNOSIS — Z0000 Encounter for general adult medical examination without abnormal findings: Secondary | ICD-10-CM

## 2012-04-22 DIAGNOSIS — Z13220 Encounter for screening for lipoid disorders: Secondary | ICD-10-CM

## 2012-04-22 MED ORDER — TOLTERODINE TARTRATE ER 4 MG PO CP24
4.0000 mg | ORAL_CAPSULE | Freq: Every day | ORAL | Status: DC
Start: 2012-04-22 — End: 2018-03-10

## 2012-04-22 NOTE — Patient Instructions (Addendum)
Will check your thyroid lab today  Let's plan on getting your pap smear and fasting labs (cholesterol, blood sugar) in the next 6 months  Nice to meet you!

## 2012-04-22 NOTE — Assessment & Plan Note (Signed)
Primary urge, occasional stress.  Will continue detrol

## 2012-04-22 NOTE — Assessment & Plan Note (Signed)
Intermittent bursitis.  Doing ok today

## 2012-04-22 NOTE — Progress Notes (Signed)
  Subjective:    Patient ID: [name], female    DOB: 1965-08-18, 46 y.o.   MRN: [id number]  HPI  Here for annual physical  Hypothyroidism:  Has thyroid nodule and goiter monitored by endocrinology for years.  In past few years has been treated by previous PCP for hypothyroidism with synthroid.  Occasional hoarse voice when talking a lot.  Not regularly.  Review of Systems Patient Information Form: Screening and ROS  AUDIT-C Score: 2 Do you feel safe in relationships? yes PHQ-2:negative  Review of Symptoms  General:  Negative for nexplained weight loss, fever Skin: Negative for new or changing mole, sore that won't heal HEENT: Negative for trouble hearing, trouble seeing, ringing in ears, mouth sores, hoarseness, change in voice, dysphagia. CV:  Negative for chest pain, dyspnea, edema, palpitations Resp: Negative for cough, dyspnea, hemoptysis GI: Negative for nausea, vomiting, diarrhea, constipation, abdominal pain, melena, hematochezia. GU: Negative for dysuria, incontinence, urinary hesitance, hematuria, vaginal or penile discharge, polyuria, sexual difficulty, lumps in testicle or breasts MSK: Negative for muscle cramps or aches, joint pain or swelling Neuro: Negative for headaches, weakness, numbness, dizziness, passing out/fainting Psych: Negative for depression, anxiety, memory problems      Objective:   Physical Exam GEN: Alert & Oriented, No acute distress Neck;  Minimally enlarged symmetric thyroid, no nodules CV:  Regular Rate & Rhythm, no murmur Respiratory:  Normal work of breathing, CTAB Abd:  + BS, soft, no tenderness to palpation Ext: no pre-tibial edema Gyn exam deferred today.  Has breast cancer follow-up - just saw oncology last month       Assessment & Plan:  Flu shot today  Patient prefers to defer pap.  History of hysterectomy with oophorectomy due to possible cancer- patient states path benign. Last pap 2008, would recommend pap every 3 years  due to report of remaining cervical tissue.  She will return at her convenience.

## 2012-04-22 NOTE — Assessment & Plan Note (Signed)
Will check TSH

## 2012-04-22 NOTE — Assessment & Plan Note (Signed)
Due to femara.  Using topical lubricants, estrogen contraindicated.

## 2012-04-23 ENCOUNTER — Encounter: Payer: Self-pay | Admitting: Family Medicine

## 2012-04-23 ENCOUNTER — Telehealth: Payer: Self-pay | Admitting: *Deleted

## 2012-04-23 MED ORDER — LEVOTHYROXINE SODIUM 88 MCG PO TABS: 88.0000 ug | ORAL_TABLET | Freq: Every day | ORAL | Status: DC

## 2012-04-23 NOTE — Telephone Encounter (Signed)
Patient is asking if the script for her Synthroid can be printed and faxed to [facility] [employer] where she works.  Their fax number is [phone].

## 2012-04-23 NOTE — Addendum Note (Signed)
Addended by: Katherina Mires on: 04/23/2012 08:52 AM   Modules accepted: Orders

## 2012-04-24 MED ORDER — LEVOTHYROXINE SODIUM 88 MCG PO TABS: 88.0000 ug | ORAL_TABLET | Freq: Every day | ORAL | Status: DC

## 2012-04-24 NOTE — Telephone Encounter (Signed)
Placed in fax pile.

## 2012-05-06 LAB — LIPID PANEL
Cholesterol: 164 mg/dL (ref 0–200)
HDL: 56 mg/dL (ref 35–70)
LDL Cholesterol: 89 mg/dL

## 2012-05-07 ENCOUNTER — Encounter: Payer: Self-pay | Admitting: *Deleted

## 2012-05-26 ENCOUNTER — Other Ambulatory Visit (INDEPENDENT_AMBULATORY_CARE_PROVIDER_SITE_OTHER): Payer: PRIVATE HEALTH INSURANCE

## 2012-05-26 ENCOUNTER — Ambulatory Visit (INDEPENDENT_AMBULATORY_CARE_PROVIDER_SITE_OTHER): Payer: PRIVATE HEALTH INSURANCE | Admitting: Internal Medicine

## 2012-05-26 ENCOUNTER — Encounter: Payer: Self-pay | Admitting: Internal Medicine

## 2012-05-26 VITALS — BP 112/64 | HR 68 | Ht 65.75 in | Wt 132.2 lb

## 2012-05-26 DIAGNOSIS — Z8371 Family history of colonic polyps: Secondary | ICD-10-CM

## 2012-05-26 DIAGNOSIS — Z1211 Encounter for screening for malignant neoplasm of colon: Secondary | ICD-10-CM

## 2012-05-26 DIAGNOSIS — Z853 Personal history of malignant neoplasm of breast: Secondary | ICD-10-CM

## 2012-05-26 LAB — CBC WITH DIFFERENTIAL/PLATELET
Basophils Absolute: 0 10*3/uL (ref 0.0–0.1)
Basophils Relative: 0.6 % (ref 0.0–3.0)
Eosinophils Absolute: 0.1 10*3/uL (ref 0.0–0.7)
Hemoglobin: 12.2 g/dL (ref 12.0–15.0)
Lymphs Abs: 1.5 10*3/uL (ref 0.7–4.0)
MCHC: 32.4 g/dL (ref 30.0–36.0)
MCV: 83.6 fl (ref 78.0–100.0)
Monocytes Absolute: 0.4 10*3/uL (ref 0.1–1.0)
Neutro Abs: 3.7 10*3/uL (ref 1.4–7.7)
RBC: 4.49 Mil/uL (ref 3.87–5.11)
RDW: 12.7 % (ref 11.5–14.6)

## 2012-05-26 MED ORDER — MOVIPREP 100 G PO SOLR: 1.0000 | Freq: Once | ORAL | Status: DC

## 2012-05-26 NOTE — Progress Notes (Signed)
[name] 05-Mar-1966 MRN [id number]   History of Present Illness:  This is a 46 year old white female who is here to discuss a screening colonoscopy. She has a positive family history of colon polyps in her mother and in her brother. Some of their polyps were precancerous. She has irregular bowel habits and has had several episodes of low volume hematochezia.. She had a right femoral hernia which was repaired in the past but has now recurred.. There is a history of breast cancer in 1998 with a recurrence in 2003. She has mild anemia. Her last hemoglobin was 11.2 and her hematocrit was 35.2 with an MCV of 84.   Past Medical History  Diagnosis Date  . HX: breast cancer 11/98 and 12/03    recurrance  . Hypothyroidism   . History of MRI of cervical spine 4/03    mild DJD  . History of bone density study     no osteopenia  . Anxiety   . Asthma   . Chronic headaches   . IBS (irritable bowel syndrome)   . Inguinal hernia    Past Surgical History  Procedure Date  . Total abdominal hysterectomy w/ bilateral salpingoophorectomy 2007    left cervix in place  . Inguinal hernia repair     right  . Mastectomy     right    reports that she has never smoked. She has never used smokeless tobacco. She reports that she drinks alcohol. She reports that she does not use illicit drugs. family history includes Breast cancer in her paternal aunt; Colon polyps in her brother and mother; Hypertension in her mother; Irritable bowel syndrome in her mother; and Skin cancer in her father.  There is no history of Diabetes, and Heart disease, and Stroke, . Allergies  Allergen Reactions  . Sulfa Antibiotics Rash        Review of Systems: Denies heartburn dysphagia or chest pain  The remainder of the 10 point ROS is negative except as outlined in H&P   Physical Exam: General appearance  Well developed, in no distress. Eyes- non icteric. HEENT nontraumatic, normocephalic. Mouth no lesions, tongue  papillated, no cheilosis. Neck supple without adenopathy, thyroid not enlarged, no carotid bruits, no JVD. Lungs Clear to auscultation bilaterally. Cor normal S1, normal S2, regular rhythm, no murmur,  quiet precordium. Abdomen: Soft scaphoid with normoactive bowel sounds. I could not reproduce the right inguinal hernia by standing up. She reports swelling when she strains for a bowl movement.. There is a well-healed surgical scar in the suprapubic area. Rectal: Soft Hemoccult negative stool. No external hemorrhoids Extremities no pedal edema. Skin no lesions. Neurological alert and oriented x 3. Psychological normal mood and affect.  Assessment and Plan:  Problem #32 46 year old white female with a personal history of breast cancer and family history of colon polyps. She has irregular bowel habits. She has mild microcytic anemia.and small volume hematochezia. We will go ahead with a screening colonoscopy. A CT scan of the abdomen and pelvis in August 2012 showed a questionable renal calculus. I have discussed a colonoscopy with her as well as the prep and sedation. I recommended that we observe the hernia at the moment. It does not seem to bother her.   05/26/2012 Delfin Edis

## 2012-05-26 NOTE — Patient Instructions (Addendum)
Your physician has requested that you go to the basement for the following lab work before leaving today: CBC, IBC You have been scheduled for a colonoscopy with propofol. Please follow written instructions given to you at your visit today.  Please pick up your prep kit at the pharmacy within the next 1-3 days. If you use inhalers (even only as needed), please bring them with you on the day of your procedure. CC: Dr B.Benay Spice, Dr Derrek Monaco

## 2012-05-27 ENCOUNTER — Encounter: Payer: Self-pay | Admitting: Internal Medicine

## 2012-06-25 ENCOUNTER — Ambulatory Visit (INDEPENDENT_AMBULATORY_CARE_PROVIDER_SITE_OTHER): Payer: PRIVATE HEALTH INSURANCE | Admitting: Family Medicine

## 2012-06-25 ENCOUNTER — Encounter: Payer: Self-pay | Admitting: Family Medicine

## 2012-06-25 VITALS — BP 118/76 | HR 61 | Temp 98.4°F | Ht 65.75 in | Wt 141.0 lb

## 2012-06-25 DIAGNOSIS — N3941 Urge incontinence: Secondary | ICD-10-CM

## 2012-06-25 DIAGNOSIS — R319 Hematuria, unspecified: Secondary | ICD-10-CM | POA: Insufficient documentation

## 2012-06-25 DIAGNOSIS — R300 Dysuria: Secondary | ICD-10-CM

## 2012-06-25 LAB — POCT URINALYSIS DIPSTICK
Bilirubin, UA: NEGATIVE
Glucose, UA: NEGATIVE
Ketones, UA: NEGATIVE
Leukocytes, UA: NEGATIVE
Spec Grav, UA: 1.02

## 2012-06-25 LAB — POCT UA - MICROSCOPIC ONLY

## 2012-06-25 MED ORDER — TOLTERODINE TARTRATE ER 4 MG PO CP24: 4.0000 mg | ORAL_CAPSULE | Freq: Every day | ORAL | Status: DC

## 2012-06-25 NOTE — Assessment & Plan Note (Addendum)
She is here today feeling like "the tip of my bladder is having as a spasm" for 1 day. She has a history of urge incontinence and has not been taking Detrol which she took intermittently in the past due to running out and so new Rx was given and she was advised to resume to see if it helps. She was told she can continue Uristat if she would like since it seems to be helping.  Her urinalysis and microscopic is not consistent with infection, however, is concerning for significant hematuria. Nephritic syndrome is unlikely due to absence of protein. She had basic lab work done recently at her work place which she will fax or send over and so declines lab testing today (I am interested to see if she had protein checked). She does not have menstrual cycles due to hysterectomy about 10 years ago.  We will check urine culture to fully rule-out due to patient's concerns, although we discussed several times today urinalysis not consistent with and symptoms questions for UTI.  She was asked to follow-up in 1-2 weeks for re-evaluation and repeat urinalysis. She is concerned about bladder cancer; her aunt had this. If she continues to have hematuria, she may warrant imaging or urology referral for cystoscopy and consultation.  Other possibilities include interstitial cystitis; femara (5% reported vaginal bleeding)

## 2012-06-25 NOTE — Progress Notes (Signed)
  Subjective:    Patient ID: [name], female    DOB: 28-Jun-1966, 46 y.o.   MRN: [id number]  HPI # "It feels like the tip of my bladder is having a spasm". She is concerned about a urinary tract infection causing this.  She denies dysuria and change in baseline frequency and urgency. She has been diagnosed with urge incontinence and takes Detrol intermittently; she ran out of this. She usually has go use the bathroom every 30 minutes. She has tried [employer] OTC and thinks it helps.  Review of Systems Denies fevers/chills/nausea/back pain/abdominal pain Denies changes in appetite   Allergies, medication, past medical history reviewed.  Distant history of breast cancer on Femara    Objective:   Physical Exam GEN: NAD; well-nourished, -appearing PSYCH: appears mildly anxious ABD: NABS, soft, NT, ND GU: urethra appears normal without lesions or prolapse; moderate tenderness with palpation with Q-tip    Assessment & Plan:

## 2012-06-25 NOTE — Patient Instructions (Addendum)
Re-start Detrol.  Follow-up in 2 weeks for re-evaluation and to re-check your urine.  If you have signs of a urine infection: fevers/chills, back pain, burning with urination   Please send [place] a copy of your lab results. Our fax number is [phone].

## 2012-06-26 ENCOUNTER — Telehealth: Payer: Self-pay | Admitting: *Deleted

## 2012-06-26 NOTE — Telephone Encounter (Signed)
Patient asking about the results of her urinalysis.  States she is still having dysuria.

## 2012-06-28 LAB — URINE CULTURE: Colony Count: 60000

## 2012-06-28 MED ORDER — CEPHALEXIN 500 MG PO CAPS: 500.0000 mg | ORAL_CAPSULE | Freq: Two times a day (BID) | ORAL | Status: DC

## 2012-06-28 NOTE — Telephone Encounter (Signed)
LVM. Urine culture grew 60,000 E. Coli. Since she is still having symptoms and did have significant blood in urine, will go ahead and treat with Keflex. Asked patient to follow-up with [place] if her symptoms persist after course of antibiotic and at least in 4 weeks to re-check urine for blood.   Patient called me back. Her dysuria was significant on 11/15 but is now improved, although she still have some dysuria. I recommended she refrain from antibiotic but may start if symptoms get severe again. Advised to follow-up in 1 week if she still has symptoms.   Patient amenable to plan.

## 2012-06-29 ENCOUNTER — Encounter: Payer: Self-pay | Admitting: Family Medicine

## 2012-07-14 ENCOUNTER — Encounter: Payer: Self-pay | Admitting: Internal Medicine

## 2012-07-14 ENCOUNTER — Ambulatory Visit (AMBULATORY_SURGERY_CENTER): Payer: PRIVATE HEALTH INSURANCE | Admitting: Internal Medicine

## 2012-07-14 VITALS — BP 136/90 | HR 69 | Temp 99.0°F | Resp 15 | Ht 65.0 in | Wt 132.0 lb

## 2012-07-14 DIAGNOSIS — Z1211 Encounter for screening for malignant neoplasm of colon: Secondary | ICD-10-CM

## 2012-07-14 DIAGNOSIS — K5010 Crohn's disease of large intestine without complications: Secondary | ICD-10-CM

## 2012-07-14 DIAGNOSIS — Z83719 Family history of colon polyps, unspecified: Secondary | ICD-10-CM

## 2012-07-14 DIAGNOSIS — Z8371 Family history of colonic polyps: Secondary | ICD-10-CM

## 2012-07-14 MED ORDER — SODIUM CHLORIDE 0.9 % IV SOLN: 500.0000 mL | INTRAVENOUS | Status: DC

## 2012-07-14 MED ORDER — HYDROCORTISONE ACE-PRAMOXINE 1-1 % RE CREA: TOPICAL_CREAM | Freq: Two times a day (BID) | RECTAL | Status: DC

## 2012-07-14 NOTE — Progress Notes (Signed)
Patient did not experience any of the following events: a burn prior to discharge; a fall within the facility; wrong site/side/patient/procedure/implant event; or a hospital transfer or hospital admission upon discharge from the facility. (G8907) Patient did not have preoperative order for IV antibiotic SSI prophylaxis. (G8918)  

## 2012-07-14 NOTE — Progress Notes (Signed)
Patient with a history of right mastectomy with lymph node dissection. Iv in right hand. Patient stating she told the admitting nurse that she had no restrictions on her right side regarding IV sticks or BP. IV discontinued at 1510.

## 2012-07-14 NOTE — Progress Notes (Signed)
Pt stable Awake Report to RN

## 2012-07-14 NOTE — Op Note (Signed)
[place]  [address] [place], [address]   COLONOSCOPY PROCEDURE REPORT  PATIENT: [name], [name]  MR#: [id number] BIRTHDATE: Aug 20, 1965 , 46  yrs. old GENDER: Female ENDOSCOPIST: Lafayette Dragon, MD REFERRED BY:  Suzanna Obey, M.D.  Arturo Morton, M.D. PROCEDURE DATE:  07/14/2012 PROCEDURE:   Colonoscopy, screening ASA CLASS:   Class I INDICATIONS:family history of colon polyps mother and brother,, rectal bleeding. MEDICATIONS: MAC sedation, administered by CRNA, propofol (Diprivan) 42m IV, and Versed 2 mg IV  DESCRIPTION OF PROCEDURE:   After the risks and benefits and of the procedure were explained, informed consent was obtained.  A digital rectal exam revealed no abnormalities of the rectum.    The LB [id number] endoscope was introduced through the anus and advanced to the cecum, which was identified by both the appendix and ileocecal valve .  The quality of the prep was good, using MoviPrep .  The instrument was then slowly withdrawn as the colon was fully examined.     COLON FINDINGS: Abnormal mucosa was found in the rectum.  Erythema and granularity o-30 cm from the rectum, no discrete ulcerations, biopsies taken to r/o IBD.     Retroflexed views revealed no abnormalities.     The scope was then withdrawn from the patient and the procedure completed.  COMPLICATIONS: There were no complications. ENDOSCOPIC IMPRESSION: Abnormal mucosa was found in the rectum  suggestive of a non specific colitis, r/o prep induced, r/o ulcerative proctitis, biopsies taken  RECOMMENDATIONS: Await biopsy results   REPEAT EXAM: In 10 year(s)  for Colonoscopy.  cc:  _______________________________ eSigned:Lafayette Dragon MD 07/14/2012 2:51 PM

## 2012-07-14 NOTE — Patient Instructions (Addendum)
YOU HAD AN ENDOSCOPIC PROCEDURE TODAY AT THE [facility]: Refer to the procedure report that was given to you for any specific questions about what was found during the examination.  If the procedure report does not answer your questions, please call your gastroenterologist to clarify.  If you requested that your care partner not be given the details of your procedure findings, then the procedure report has been included in a sealed envelope for you to review at your convenience later.  YOU SHOULD EXPECT: Some feelings of bloating in the abdomen. Passage of more gas than usual.  Walking can help get rid of the air that was put into your GI tract during the procedure and reduce the bloating. If you had a lower endoscopy (such as a colonoscopy or flexible sigmoidoscopy) you may notice spotting of blood in your stool or on the toilet paper. If you underwent a bowel prep for your procedure, then you may not have a normal bowel movement for a few days.  DIET: Your first meal following the procedure should be a light meal and then it is ok to progress to your normal diet.  A half-sandwich or bowl of soup is an example of a good first meal.  Heavy or fried foods are harder to digest and may make you feel nauseous or bloated.  Likewise meals heavy in dairy and vegetables can cause extra gas to form and this can also increase the bloating.  Drink plenty of fluids but you should avoid alcoholic beverages for 24 hours.  ACTIVITY: Your care partner should take you home directly after the procedure.  You should plan to take it easy, moving slowly for the rest of the day.  You can resume normal activity the day after the procedure however you should NOT DRIVE or use heavy machinery for 24 hours (because of the sedation medicines used during the test).    SYMPTOMS TO REPORT IMMEDIATELY: A gastroenterologist can be reached at any hour.  During normal business hours, 8:30 AM to 5:00 PM Monday through Friday,  call [phone].  After hours and on weekends, please call the GI answering service at [phone] who will take a message and have the physician on call contact you.   Following lower endoscopy (colonoscopy or flexible sigmoidoscopy):  Excessive amounts of blood in the stool  Significant tenderness or worsening of abdominal pains  Swelling of the abdomen that is new, acute  Fever of 100F or higher    FOLLOW UP: If any biopsies were taken you will be contacted by phone or by letter within the next 1-3 weeks.  Call your gastroenterologist if you have not heard about the biopsies in 3 weeks.  Our staff will call the home number listed on your records the next business day following your procedure to check on you and address any questions or concerns that you may have at that time regarding the information given to you following your procedure. This is a courtesy call and so if there is no answer at the home number and we have not heard from you through the emergency physician on call, we will assume that you have returned to your regular daily activities without incident.  SIGNATURES/CONFIDENTIALITY: You and/or your care partner have signed paperwork which will be entered into your electronic medical record.  These signatures attest to the fact that that the information above on your After Visit Summary has been reviewed and is understood.  Full responsibility of the confidentiality   of this discharge information lies with you and/or your care-partner.     

## 2012-07-15 ENCOUNTER — Telehealth: Payer: Self-pay | Admitting: *Deleted

## 2012-07-15 NOTE — Telephone Encounter (Signed)
  Follow up Call-  Call back number 07/14/2012  Post procedure Call Back phone  # [phone]  Permission to leave phone message Yes     Patient questions:  Do you have a fever, pain , or abdominal swelling? no Pain Score  0 *  Have you tolerated food without any problems? yes  Have you been able to return to your normal activities? yes  Do you have any questions about your discharge instructions: Diet   no Medications  no Follow up visit  no  Do you have questions or concerns about your Care? no  Actions: * If pain score is 4 or above: No action needed, pain <4.

## 2012-07-20 ENCOUNTER — Ambulatory Visit
Admission: RE | Admit: 2012-07-20 | Discharge: 2012-07-20 | Disposition: A | Discharge status: Home / Self Care | Payer: PRIVATE HEALTH INSURANCE | Source: Ambulatory Visit | Attending: Oncology | Admitting: Oncology

## 2012-07-20 DIAGNOSIS — C50919 Malignant neoplasm of unspecified site of unspecified female breast: Secondary | ICD-10-CM

## 2012-07-22 ENCOUNTER — Encounter: Payer: Self-pay | Admitting: Internal Medicine

## 2012-07-23 ENCOUNTER — Encounter: Payer: Self-pay | Admitting: *Deleted

## 2012-07-28 ENCOUNTER — Ambulatory Visit (INDEPENDENT_AMBULATORY_CARE_PROVIDER_SITE_OTHER): Payer: PRIVATE HEALTH INSURANCE | Admitting: Internal Medicine

## 2012-07-28 ENCOUNTER — Encounter: Payer: Self-pay | Admitting: Internal Medicine

## 2012-07-28 VITALS — BP 94/68 | HR 60 | Ht 65.0 in | Wt 138.8 lb

## 2012-07-28 DIAGNOSIS — K5010 Crohn's disease of large intestine without complications: Secondary | ICD-10-CM

## 2012-07-28 MED ORDER — MESALAMINE 1000 MG RE SUPP: RECTAL | Status: DC

## 2012-07-28 MED ORDER — MESALAMINE 1.2 G PO TBEC: DELAYED_RELEASE_TABLET | ORAL | Status: DC

## 2012-07-28 NOTE — Progress Notes (Signed)
[name] April 04, 1966 MRN [id number]   History of Present Illness:  This is a 46 year old white female with a new diagnosis of granulomatous colitis made on a screening colonoscopy several weeks ago. The biopsies showed multiple granulomas in the left and the right colon. On colonoscopy, there was granularity and bleeding in the rectosigmoid. She has been experiencing low volume hematochezia 2-3 times a week for the past 2-3 years. Her stools have been soft and sometimes loose. She has a history of breast cancer and anemia. A CT scan of the abdomen in August 2012 for right lower quadrant abdominal pain showed fullness of the right ureter and a 2 mm right renal calculus. Her symptoms include low back pain. There is a family history of autoimmune disease in her brother who has dermatomyositis and another brother who has psoriasis. Her mother apparently has an autoimmune disease of some sort as well.   Past Medical History  Diagnosis Date  . HX: breast cancer 11/98 and 12/03    recurrance  . Hypothyroidism   . History of MRI of cervical spine 4/03    mild DJD  . History of bone density study     no osteopenia  . Anxiety   . Asthma   . Chronic headaches   . IBS (irritable bowel syndrome)   . Inguinal hernia   . Granulomatous colitis    Past Surgical History  Procedure Date  . Total abdominal hysterectomy w/ bilateral salpingoophorectomy 2007    left cervix in place  . Inguinal hernia repair     right  . Mastectomy     right    reports that she has never smoked. She has never used smokeless tobacco. She reports that she drinks alcohol. She reports that she does not use illicit drugs. family history includes Breast cancer in her paternal aunt; Colon polyps in her brother and mother; Hypertension in her mother; Irritable bowel syndrome in her mother; and Skin cancer in her father.  There is no history of Diabetes, and Heart disease, and Stroke, . Allergies  Allergen Reactions  .  Sulfa Antibiotics Rash        Review of Systems: Denies heartburn abdominal pain nausea vomiting or weight loss  The remainder of the 10 point ROS is negative except as outlined in H&P   Physical Exam: General appearance  Well developed, in no distress. Neurological alert and oriented x 3. Psychological normal mood and affect.  Assessment and Plan:  Problem #1 New diagnosis of granulomatous colitis consistent with Crohn's colitis. Less likely sarcoidosis or infectious colitis. We will begin mesalamine 3.6 g daily and Canasa suppositories 1,000 mg at bedtime. I will see her in 3 months and we will likely obtain a sedimentation rate, sprue profile, iron studies and B12 at that time. She will also let us know whether her insurance will cover her mesalamine. We may have to switch her to another product. We have discussed a low residue diet and minimizing her caffeine intake. She may continue to exercise.   07/28/2012 Delfin Edis

## 2012-07-28 NOTE — Patient Instructions (Addendum)
We have sent the following medications to your pharmacy for you to pick up at your convenience: Lialda (3 tablets daily) Canasa (1 every night x 2 weeks, then 1 suppository 2 nights per week)  Please follow up with Dr Olevia Perches in 3 months.  Crohn's Disease Crohn's disease is a long-term (chronic) soreness and redness (inflammation) of the intestines (bowel). It can affect any portion of the digestive tract, from the mouth to the anus. It can also cause problems outside the digestive tract. Crohn's disease is closely related to a disease called ulcerative colitis (together, these two diseases are called inflammatory bowel disease).  CAUSES  The cause of Crohn's disease is not known. One Link Snuffer is that, in an easily affected (susceptible) person, the immune system is triggered to attack the body's own digestive tissue. Crohn's disease runs in families. It seems to be more common in certain geographic areas and amongst certain races. There are no clear-cut dietary causes.  SYMPTOMS  Crohn's disease can cause many different symptoms since it can affect many different parts of the body. Symptoms include:  Fatigue.  Weight loss.  Chronic diarrhea, sometime bloody.  Abdominal pain and cramps.  Fever.  Ulcers or canker sores in the mouth or rectum.  Anemia (low red blood cells).  Arthritis, skin problems, and eye problems may occur. Complications of Crohn's disease can include:  Series of holes (perforation) of the bowel.  Portions of the intestines sticking to each other (adhesions).  Obstruction of the bowel.  Fistula formation, typically in the rectal area but also in other areas. A fistula is an opening between the bowels and the outside, or between the bowels and another organ.  A painful crack in the mucous membrane of the anus (rectal fissure). DIAGNOSIS  Your caregiver may suspect Crohn's disease based on your symptoms and an exam. Blood tests may confirm that there is a  problem. You may be asked to submit a stool specimen for examination. X-rays and CT scans may be necessary. Ultimately, the diagnosis is usually made after a procedure that uses a flexible tube that is inserted via your mouth or your anus. This is done under sedation and is called either an upper endoscopy or colonoscopy. With these tests, the specialist can take tiny tissue samples and remove them from the inside of the bowel (biopsy). Examination of this biopsy tissue under a microscope can reveal Crohn's disease as the cause of your symptoms. Due to the many different forms that Crohn's disease can take, symptoms may be present for several years before a diagnosis is made. HOME CARE INSTRUCTIONS   There is no cure for Crohn's disease. The best treatment is frequent checkups with your caregiver.  Symptoms such as diarrhea can be controlled with medications. Avoid foods that have a laxative effect such as fresh fruit, vegetables and dairy products. During flare ups, you can rest your bowel by refraining from solid foods. Drink clear liquids frequently during the day (electrolyte or re-hydrating fluids are best. Your caregiver can help you with suggestions). Drink often to prevent loss of body fluids (dehydration). When diarrhea has cleared, eat small meals and more frequently. Avoid food additives and stimulants such as caffeine (coffee, tea, or chocolate). Enzyme supplements may help if you develop intolerance to a sugar in dairy products (lactose). Ask your caregiver or dietitian about specific dietary instructions.  Try to maintain a positive attitude. Learn relaxation techniques such as self hypnosis, mental imaging, and muscle relaxation.  If possible, avoid stresses  which can aggravate your condition.  Exercise regularly.  Follow your diet.  Always get plenty of rest. SEEK MEDICAL CARE IF:   Your symptoms fail to improve after a week or two of new treatment.  You experience continued  weight loss.  You have ongoing crampy digestion or loose bowels.  You develop a new skin rash, skin sores, or eye problems. SEEK IMMEDIATE MEDICAL CARE IF:   You have worsening of your symptoms or develop new symptoms.  You have a fever.  You develop bloody diarrhea.  You develop severe abdominal pain. MAKE SURE YOU:   Understand these instructions.  Will watch your condition.  Will get help right away if you are not doing well or get worse. Document Released: 05/08/2005 Document Revised: 10/21/2011 Document Reviewed: 04/06/2007 [facility] Patient Information 2013 [employer].  CC: Dr Suzanna Obey, Dr Benay Spice,

## 2012-07-29 ENCOUNTER — Telehealth: Payer: Self-pay | Admitting: Internal Medicine

## 2012-07-29 NOTE — Telephone Encounter (Signed)
Good! She ought to take 4 AsacalHD = 3.2 gm a day in 2 divided doses. Please send 3 month supply of Asacal [id number],#360, 3 refills.

## 2012-07-29 NOTE — Telephone Encounter (Signed)
Patient states that [name] will cost her $130.00 per month. Her insurance prefers her to take Asacol HD 800 mg tablets which would be $40.00 per month. Dr Olevia Perches, can patient take Asacol HD instead of Lialda. If so, how much of the Asacol does she need to take daily?

## 2012-07-30 MED ORDER — MESALAMINE 800 MG PO TBEC: 2.0000 | DELAYED_RELEASE_TABLET | Freq: Two times a day (BID) | ORAL | Status: DC

## 2012-07-30 NOTE — Telephone Encounter (Signed)
No substitution for Canasa, but she can reduce the frequency to qohs, so it will last her twice as long.

## 2012-07-30 NOTE — Telephone Encounter (Signed)
Patient advised that we will send Asacol HD to her mail in pharmacy in place of [facility]. I have gone over instructions for medication with her. It appears however that her canasa suppositories will also be $140.00 per month (which patient was not previously aware of). Are there any other substitutions for the canasa you would like to give?

## 2012-07-30 NOTE — Telephone Encounter (Signed)
Noted  

## 2012-07-31 ENCOUNTER — Other Ambulatory Visit: Payer: Self-pay | Admitting: *Deleted

## 2012-07-31 DIAGNOSIS — C50919 Malignant neoplasm of unspecified site of unspecified female breast: Secondary | ICD-10-CM

## 2012-07-31 MED ORDER — LETROZOLE 2.5 MG PO TABS: 2.5000 mg | ORAL_TABLET | Freq: Every day | ORAL | Status: DC

## 2012-08-03 ENCOUNTER — Other Ambulatory Visit: Payer: Self-pay | Admitting: Family Medicine

## 2012-08-03 MED ORDER — LEVOTHYROXINE SODIUM 88 MCG PO TABS: 88.0000 ug | ORAL_TABLET | Freq: Every day | ORAL | Status: DC

## 2012-10-02 ENCOUNTER — Telehealth: Payer: Self-pay | Admitting: Oncology

## 2012-10-02 ENCOUNTER — Ambulatory Visit (HOSPITAL_BASED_OUTPATIENT_CLINIC_OR_DEPARTMENT_OTHER): Payer: PRIVATE HEALTH INSURANCE | Admitting: Oncology

## 2012-10-02 ENCOUNTER — Other Ambulatory Visit: Payer: Self-pay | Admitting: Lab

## 2012-10-02 VITALS — BP 104/71 | HR 69 | Temp 97.9°F | Resp 20 | Ht 65.0 in | Wt 135.9 lb

## 2012-10-02 DIAGNOSIS — K5090 Crohn's disease, unspecified, without complications: Secondary | ICD-10-CM

## 2012-10-02 DIAGNOSIS — C50919 Malignant neoplasm of unspecified site of unspecified female breast: Secondary | ICD-10-CM

## 2012-10-02 NOTE — Telephone Encounter (Signed)
Talked to patient and gave her appt for August 2014

## 2012-10-02 NOTE — Progress Notes (Signed)
   [place]    OFFICE PROGRESS NOTE   INTERVAL HISTORY:   She returns as scheduled. She continues Femara. No change over the chest wall. No pain or shortness of breath. Good appetite. She has been diagnosed with Crohn's disease and is followed by Dr. Olevia Perches.  A mammogram on 07/22/2012 was negative.  Objective:  Vital signs in last 24 hours:  Blood pressure 104/71, pulse 69, temperature 97.9 F (36.6 C), temperature source Oral, resp. rate 20, height 5' 5"  (1.651 m), weight 135 lb 14.4 oz (61.644 kg).    HEENT: Neck without mass Lymphatics: No cervical, supraclavicular, or axillary nodes Resp: Lungs clear bilaterally Cardio: Regular rate and rhythm GI: No hepatomegaly Vascular: No leg edema Breasts: Status post right mastectomy with a TRAM reconstruction. Firm tissue at the medial aspect of the TRAM. No evidence for chest wall tumor recurrence. Left breast without mass.    Medications: I have reviewed the patient's current medications.  Assessment/Plan: [name] was diagnosed with right-sided breast cancer in November 1998. She developed a right breast recurrence in December 2003 and underwent a right mastectomy/TRAM reconstruction. She completed adjuvant AC and Taxol chemotherapy. She took tamoxifen from August 2004 through July 2009. She began Femara on 04/12/2008. The plan is to continue Femara for 5 years. She remains in clinical remission.   She has been diagnosed with Crohn's disease and is followed by Dr. Olevia Perches.   Disposition:  She will return for an office visit in 6 months. She continues Femara.   [name], MD  10/02/2012  9:47 AM

## 2012-10-12 ENCOUNTER — Other Ambulatory Visit: Payer: Self-pay

## 2012-10-23 ENCOUNTER — Ambulatory Visit
Admission: RE | Admit: 2012-10-23 | Discharge: 2012-10-23 | Disposition: A | Discharge status: Home / Self Care | Payer: PRIVATE HEALTH INSURANCE | Source: Ambulatory Visit | Attending: Oncology | Admitting: Oncology

## 2012-10-23 DIAGNOSIS — M8580 Other specified disorders of bone density and structure, unspecified site: Secondary | ICD-10-CM

## 2012-10-29 ENCOUNTER — Encounter: Payer: Self-pay | Admitting: Family Medicine

## 2012-10-29 ENCOUNTER — Telehealth: Payer: Self-pay | Admitting: Family Medicine

## 2012-10-29 DIAGNOSIS — M8580 Other specified disorders of bone density and structure, unspecified site: Secondary | ICD-10-CM | POA: Insufficient documentation

## 2012-10-29 NOTE — Telephone Encounter (Signed)
Also sent letter

## 2012-10-29 NOTE — Assessment & Plan Note (Addendum)
Rec check Vit D, weight bearing exercise, vit D  + calcium, recheck DEXA in 1 year.  Invited patient to discus in further detail bisphosphonate for prevention, but do not feel imperitive to start at this time.

## 2012-10-29 NOTE — Telephone Encounter (Signed)
Left message about osteopenia

## 2012-11-03 ENCOUNTER — Other Ambulatory Visit: Payer: Self-pay | Admitting: Oncology

## 2012-11-03 DIAGNOSIS — C50919 Malignant neoplasm of unspecified site of unspecified female breast: Secondary | ICD-10-CM

## 2012-11-06 ENCOUNTER — Telehealth: Payer: Self-pay | Admitting: Internal Medicine

## 2012-11-06 DIAGNOSIS — K5090 Crohn's disease, unspecified, without complications: Secondary | ICD-10-CM

## 2012-11-06 MED ORDER — MESALAMINE 1000 MG RE SUPP: 1000.0000 mg | Freq: Every day | RECTAL | Status: DC

## 2012-11-06 NOTE — Telephone Encounter (Signed)
Patient states she has had about 15 bloody, diarrhea stools today. States she has diarrhea off and on but not like this. Reports cramping right before having diarrhea. She is on Asacol 800 mg 2 tablets BID. Denies nausea, vomiting or fever. She is asking about suppositories that Dr. Olevia Perches had mention to her. Please, advise.

## 2012-11-06 NOTE — Telephone Encounter (Signed)
Per Dr. Olevia Perches, Increase Asacol to 3 tablets BID. Canasa suppository 1000 mg nightly.  Needs labs: CBC, Sed , Cmet and OV.  Spoke with patient and gave her Dr. Nichola Sizer recommendations. Rx sent for suppositories. Scheduled OV with  Alonza Bogus, PA on 11/09/12 at 9:15 AM. Patient states she is in [place] and cannot get to lab today. Labs in for Monday. Dr. Olevia Perches aware.

## 2012-11-09 ENCOUNTER — Other Ambulatory Visit (INDEPENDENT_AMBULATORY_CARE_PROVIDER_SITE_OTHER): Payer: PRIVATE HEALTH INSURANCE

## 2012-11-09 ENCOUNTER — Encounter: Payer: Self-pay | Admitting: *Deleted

## 2012-11-09 ENCOUNTER — Ambulatory Visit (INDEPENDENT_AMBULATORY_CARE_PROVIDER_SITE_OTHER): Payer: PRIVATE HEALTH INSURANCE | Admitting: Gastroenterology

## 2012-11-09 ENCOUNTER — Other Ambulatory Visit: Payer: Self-pay | Admitting: *Deleted

## 2012-11-09 ENCOUNTER — Encounter: Payer: Self-pay | Admitting: Gastroenterology

## 2012-11-09 VITALS — BP 120/78 | HR 68 | Ht 65.5 in | Wt 140.0 lb

## 2012-11-09 DIAGNOSIS — K625 Hemorrhage of anus and rectum: Secondary | ICD-10-CM | POA: Insufficient documentation

## 2012-11-09 DIAGNOSIS — K5090 Crohn's disease, unspecified, without complications: Secondary | ICD-10-CM

## 2012-11-09 DIAGNOSIS — K5010 Crohn's disease of large intestine without complications: Secondary | ICD-10-CM

## 2012-11-09 DIAGNOSIS — R197 Diarrhea, unspecified: Secondary | ICD-10-CM

## 2012-11-09 LAB — COMPREHENSIVE METABOLIC PANEL
ALT: 18 U/L (ref 0–35)
AST: 24 U/L (ref 0–37)
Albumin: 4.1 g/dL (ref 3.5–5.2)
BUN: 20 mg/dL (ref 6–23)
CO2: 28 mEq/L (ref 19–32)
Calcium: 9.2 mg/dL (ref 8.4–10.5)
Chloride: 101 mEq/L (ref 96–112)
GFR: 111.54 mL/min (ref >60.00)
Potassium: 4.4 mEq/L (ref 3.5–5.1)

## 2012-11-09 LAB — CBC WITH DIFFERENTIAL/PLATELET
Basophils Absolute: 0 10*3/uL (ref 0.0–0.1)
Basophils Relative: 0.6 % (ref 0.0–3.0)
Eosinophils Relative: 1.9 % (ref 0.0–5.0)
HCT: 35.9 % — ABNORMAL LOW (ref 36.0–46.0)
Hemoglobin: 11.9 g/dL — ABNORMAL LOW (ref 12.0–15.0)
Lymphocytes Relative: 19.3 % (ref 12.0–46.0)
Lymphs Abs: 0.9 10*3/uL (ref 0.7–4.0)
Monocytes Relative: 9.7 % (ref 3.0–12.0)
Neutro Abs: 3.2 10*3/uL (ref 1.4–7.7)
RBC: 4.36 Mil/uL (ref 3.87–5.11)
RDW: 12.8 % (ref 11.5–14.6)
WBC: 4.7 10*3/uL (ref 4.5–10.5)

## 2012-11-09 NOTE — Patient Instructions (Signed)
  Please go to basement for lab work.  Please follow up with Dr. Olevia Perches in four to six weeks. ____________________________________________________________________________________________                                               We are excited to introduce MyChart, a new best-in-class service that provides you online access to important information in your electronic medical record. We want to make it easier for you to view your health information - all in one secure location - when and where you need it. We expect MyChart will enhance the quality of care and service we provide.  When you register for MyChart, you can:    View your test results.    Request appointments and receive appointment reminders via email.    Request medication renewals.    View your medical history, allergies, medications and immunizations.    Communicate with your physician's office through a password-protected site.    Conveniently print information such as your medication lists.  To find out if MyChart is right for you, please talk to a member of our clinical staff today. We will gladly answer your questions about this free health and wellness tool.  If you are age 4 or older and want a member of your family to have access to your record, you must provide written consent by completing a proxy form available at our office. Please speak to our clinical staff about guidelines regarding accounts for patients younger than age 51.  As you activate your MyChart account and need any technical assistance, please call the MyChart technical support line at [phone]) or email your question to [email or web address]. If you email your question(s), please include your name, a return phone number and the best time to reach you.  If you have non-urgent health-related questions, you can send a message to our office through [place] at [email or web address]. If you have a medical emergency, call  911.  Thank you for using MyChart as your new health and wellness resource!   MyChart licensed from [employer],  1999-2010. Patents Pending.

## 2012-11-09 NOTE — Progress Notes (Signed)
11/09/2012 [name] [id number] Jul 18, 1966   History of Present Illness: This is a 47 year old white female with a new diagnosis of granulomatous colitis made during a screening colonoscopy in 07/2012. The biopsies showed multiple granulomas in the left and the right colon. On colonoscopy, there was granularity and bleeding in the rectosigmoid.  After her procedure she was placed on 3.6 grams of Asacol.  She call this past Friday complaining of 15-16 BM's with blood.  Labs were ordered (CBC, CMP, sed rate) but she was unable to get to the lab before they closed, so she is going to get them drawn after her appointment today.  Her Asacol was increased to 4.8 grams per day and a prescription for Canasa suppositories were called in to her pharmacy.  She says that the suppositories had to be ordered so she has not received those yet, however.  She says that Saturday she had 6-7 BM's, Sunday she had 5-6 BM's without blood, and today she has only moved her bowels once so far without blood.  No abdominal pain except some cramping in her LLQ when she needs to have a BM.  Says that she ran 9 miles yesterday.  Feels fatigued today.    Just of note, she has a history of breast cancer and anemia.  There is a family history of autoimmune disease in her brother who has dermatomyositis and another brother who has psoriasis. Her mother apparently has an autoimmune disease of some sort as well.    Current Medications, Allergies, Past Medical History, Past Surgical History, Family History and Social History were reviewed in [employer] record.   Physical Exam: BP 120/78  Pulse 68  Ht 5' 5.5" (1.664 m)  Wt 140 lb (63.504 kg)  BMI 22.93 kg/m2 General: Well developed, white female in no acute distress Head: Normocephalic and atraumatic Eyes:  sclerae anicteric, conjunctiva pink  Ears: Normal auditory acuity Lungs: Clear throughout to auscultation Heart: Regular rate and rhythm Abdomen:  Soft, non-distended. No masses, no hepatomegaly.  Hyperactive bowel sounds.  Mild LLQ TTP without R/R/G. Rectal: Deferred. Musculoskeletal: Symmetrical with no gross deformities  Extremities: No edema  Neurological: Alert oriented x 4, grossly nonfocal Psychological:  Alert and cooperative. Normal mood and affect  Assessment and Recommendations: -Crohn's colitis in left colon with flare:  Seems to be improving some with the increased dose of mesalamine.  She will continue the mesalamine at the 4.8 gram dose and she will begin using the Canasa suppositories when they are available for her.  We will follow-up the results of her labs today.  She will come for a follow-up visit in approximately 4 weeks, but will call sooner if she sees no further improvement or if her symptoms worsen again.  May need a prednisone taper at that point vs adding uceris.

## 2012-11-09 NOTE — Progress Notes (Signed)
Reviewed and agree. I have a low threshold for putting her on steroids, she is still having 5-6 BMs/day on max dose of Mesalamine.

## 2012-11-12 ENCOUNTER — Telehealth: Payer: Self-pay | Admitting: *Deleted

## 2012-11-12 DIAGNOSIS — C50919 Malignant neoplasm of unspecified site of unspecified female breast: Secondary | ICD-10-CM

## 2012-11-12 MED ORDER — LETROZOLE 2.5 MG PO TABS: ORAL_TABLET | ORAL | Status: DC

## 2012-11-12 NOTE — Telephone Encounter (Signed)
Patient left VM that [facility] reports trying to reach out to office X 3 and have not heard from our office yet. Requests to be called.

## 2012-11-12 NOTE — Telephone Encounter (Signed)
Called pharmacy and spoke with Nonnie Done have no record of the refill that was electronically sent on 11/03/12. Gave verbal refill to pharmacy. Patient notified.

## 2012-12-15 ENCOUNTER — Other Ambulatory Visit: Payer: Self-pay | Admitting: *Deleted

## 2012-12-15 MED ORDER — TOLTERODINE TARTRATE ER 4 MG PO CP24: 4.0000 mg | ORAL_CAPSULE | Freq: Every day | ORAL | Status: DC

## 2012-12-15 MED ORDER — FLUTICASONE PROPIONATE 50 MCG/ACT NA SUSP: 2.0000 | Freq: Every day | NASAL | Status: DC

## 2012-12-17 ENCOUNTER — Ambulatory Visit: Payer: Self-pay | Admitting: Internal Medicine

## 2012-12-17 ENCOUNTER — Other Ambulatory Visit: Payer: Self-pay | Admitting: Family Medicine

## 2012-12-17 MED ORDER — TOLTERODINE TARTRATE ER 4 MG PO CP24: 4.0000 mg | ORAL_CAPSULE | Freq: Every day | ORAL | Status: DC

## 2013-01-01 ENCOUNTER — Other Ambulatory Visit: Payer: Self-pay | Admitting: *Deleted

## 2013-01-01 MED ORDER — LEVOTHYROXINE SODIUM 88 MCG PO TABS: 88.0000 ug | ORAL_TABLET | Freq: Every day | ORAL | Status: DC

## 2013-01-01 MED ORDER — MESALAMINE 800 MG PO TBEC: 2.0000 | DELAYED_RELEASE_TABLET | Freq: Two times a day (BID) | ORAL | Status: DC

## 2013-01-08 ENCOUNTER — Telehealth: Payer: Self-pay | Admitting: Internal Medicine

## 2013-01-08 ENCOUNTER — Other Ambulatory Visit: Payer: Self-pay | Admitting: Internal Medicine

## 2013-01-08 ENCOUNTER — Other Ambulatory Visit: Payer: Self-pay

## 2013-01-08 DIAGNOSIS — K50814 Crohn's disease of both small and large intestine with abscess: Secondary | ICD-10-CM

## 2013-01-08 MED ORDER — MESALAMINE 800 MG PO TBEC: 3.0000 | DELAYED_RELEASE_TABLET | Freq: Two times a day (BID) | ORAL | Status: DC

## 2013-01-08 MED ORDER — PREDNISONE 10 MG PO TABS: ORAL_TABLET | ORAL | Status: DC

## 2013-01-08 NOTE — Telephone Encounter (Signed)
We have sent [facility] to [facility]

## 2013-01-08 NOTE — Telephone Encounter (Signed)
Dr Olevia Perches   Please advise

## 2013-01-08 NOTE — Telephone Encounter (Signed)
Spoke with patient who clarified that [facility] was saying they had not gotten the refill of Asacol 800 sent on 01-02-15.  I sent a new rx for 4.8g of Asacol 800 to [facility] and 10 days worth to [facility] to hold her over until her shipment arrived.  Patient also expressed concern that she could not afford to come back for a follow up visit.  She stated that she had been feeling better since she saw Janett Billow and did not want to have to come back in.  I told her I would discuss this with Janett Billow and communicate patient's concerns

## 2013-01-08 NOTE — Telephone Encounter (Signed)
Patient aware [facility] for [facility] to [facility].  Dr. Olevia Perches has spoken to the patient.

## 2013-01-08 NOTE — Telephone Encounter (Signed)
I have called patient concerning the status of her colitis. She is still having 4-5 loose bowel movements a day. She has Crohn's colitis. She is already on high dose of mesalamine. We have discussed starting her on prednisone 30 mg a day for one week then 20 mg for 2 weeks 15 mg for 2 weeks 10 mg for 2 weeks 5 mg for 2 weeks then discontinue and call us.

## 2013-01-15 ENCOUNTER — Telehealth: Payer: Self-pay | Admitting: *Deleted

## 2013-01-15 NOTE — Telephone Encounter (Signed)
Received vm call from pt asking if she could take melatonin.  She states Dr Olevia Perches put her on prednisone for her crohns ds. & she thinks that this is keeping her awake.  Called pt & informed that this should be OK.

## 2013-01-28 ENCOUNTER — Telehealth: Payer: Self-pay | Admitting: Internal Medicine

## 2013-01-28 NOTE — Telephone Encounter (Signed)
Pt states when she was taking prednisone 7m a day she felt ok. She is now taking 263mday but states she has already had 3 bloody diarrhea stools. Pt is still taking her Asacol. Pt wants to know what she should do. Please advise.

## 2013-01-28 NOTE — Telephone Encounter (Signed)
Go back on Pred 84m/day and come to see me ( she could not afford to see me last time, but I have to see her if I am going to treat her)/

## 2013-01-29 NOTE — Telephone Encounter (Signed)
Patient notified she will come in on 02/15/13 2:00

## 2013-02-01 ENCOUNTER — Encounter: Payer: Self-pay | Admitting: *Deleted

## 2013-02-05 ENCOUNTER — Encounter: Payer: Self-pay | Admitting: Internal Medicine

## 2013-02-05 ENCOUNTER — Ambulatory Visit (INDEPENDENT_AMBULATORY_CARE_PROVIDER_SITE_OTHER): Payer: PRIVATE HEALTH INSURANCE | Admitting: Internal Medicine

## 2013-02-05 VITALS — BP 102/54 | HR 60 | Ht 65.25 in | Wt 136.0 lb

## 2013-02-05 DIAGNOSIS — R197 Diarrhea, unspecified: Secondary | ICD-10-CM

## 2013-02-05 DIAGNOSIS — K5090 Crohn's disease, unspecified, without complications: Secondary | ICD-10-CM

## 2013-02-05 MED ORDER — DIPHENOXYLATE-ATROPINE 2.5-0.025 MG PO TABS: 1.0000 | ORAL_TABLET | ORAL | Status: DC

## 2013-02-05 MED ORDER — PREDNISONE 20 MG PO TABS: 20.0000 mg | ORAL_TABLET | Freq: Two times a day (BID) | ORAL | Status: DC

## 2013-02-05 NOTE — Patient Instructions (Addendum)
Your physician has requested that you have the following lab work: CBC, [place], IBD expanded panel (through [facility])-Per your request, you may have these labs completed [facility].  We have sent the following medications to your pharmacy for you to pick up at your convenience: Lomotil Prednisone (20 mg twice daily)  Please follow up with Dr Olevia Perches in 2 months in the office.    Low-Fiber Diet Fiber is found in fruits, vegetables, and grains. A low-fiber diet restricts fibrous foods that are not digested in the small intestine. A diet containing about 10 grams of fiber is considered low fiber.  PURPOSE  To prevent blockage of a partially obstructed or narrowed gastrointestinal tract.  To reduce fecal weight and volume.  To slow the movement of feces. WHEN IS THIS DIET USED?  It may be used during the acute phase of Crohn disease, ulcerative colitis, regional enteritis, or diverticulitis.  It may be used if your intestinal or esophageal tubes are narrowing (stenosis).  It may be used as a transitional diet following surgery, injury (trauma), or illness. CHOOSING FOODS Check labels, especially on foods from the starch list. Often times, dietary fiber content is listed on the nutrition facts panel. Please ask your Registered Dietitian if you have questions about specific foods that are related to your condition, especially if the food is not listed on this handout. Breads and Starches  Allowed: White, [place], and pita breads, plain rolls, buns, or sweet rolls, doughnuts, waffles, pancakes, bagels. Plain muffins, biscuits, matzoth. Soda, saltine, graham crackers. Pretzels, rusks, melba toast, zwieback. Cooked cereals: cornmeal, farina, or cream cereals. Dry cereals: refined corn, wheat, rice, and oat cereals (check label). Potatoes prepared any way without skins, refined macaroni, spaghetti, noodles, refined rice.  Avoid: Whole-wheat bread, rolls, and crackers. Multigrains,  rye, bran seeds, nuts, or coconut. Cereals containing whole grains, multigrains, bran, coconut, nuts, raisins. Cooked or dry oatmeal. Coarse wheat cereals, granola. Cereals advertised as "high fiber." Potato skins. Whole-grain pasta, wild or brown rice. Popcorn. Vegetables  Allowed: Strained tomato and vegetable juices. Fresh lettuce, cucumber, spinach. Well-cooked or canned: asparagus, bean sprouts, broccoli, cut green beans, cauliflower, pumpkin, beets, mushrooms, olives, yellow squash, tomato, tomato sauce, zucchini, turnips.Keep servings limited to  cup.  Avoid: Fresh, cooked, or canned: artichokes, baked beans, beet greens, Brussels sprouts, corn, kale, legumes, peas, sweet potatoes. Avoid large servings of any vegetables. Fruit  Allowed: All fruit juices except prune juice. Cooked or canned fruits without skin and seeds: apricots, applesauce, cantaloupe, cherries, grapefruit, grapes, kiwi, mandarin oranges, peaches, pears, fruit cocktail, pineapple, plums, watermelon. Fresh without skin: banana, grapes, cantaloupe, avocado, cherries, pineapple, kiwi, nectarines, peaches, blueberries. Keep servings limited to  cup or 1 piece.  Avoid: Fresh: apples with or without skin, apricots, mangoes, pears, raspberries, strawberries. Prune juice and juices with pulp, stewed or dried prunes. Dried fruits, raisins, dates. Avoid large servings of all fresh fruits. Meat and Protein Substitutes  Allowed: Ground or well-cooked tender beef, ham, veal, lamb, pork, poultry. Eggs, plain cheese. Fish, oysters, shrimp, lobster, other seafood. Liver, organ meats. Smooth nut butters.  Avoid: Tough, fibrous meats with gristle. Chunky nut butter.Cheese with seeds, nuts, or other foods not allowed. Nuts, seeds, legumes, dried peas, beans, lentils. Dairy  Allowed: All milk products except those not allowed.  Avoid: Yogurt or cheese that contains nuts, seeds, or added fruit. Soups and Combination Foods  Allowed:  Bouillon, broth, or cream soups made from allowed foods. Any strained soup. Casseroles or mixed dishes made  with allowed foods.  Avoid: Soups made from vegetables that are not allowed or that contain other foods not allowed. Desserts and Sweets  Allowed:Plain cakes and cookies, pie made with allowed fruit, pudding, custard, cream pie. Gelatin, fruit, ice, sherbet, frozen ice pops. Ice cream, ice milk without nuts. Plain hard candy, honey, jelly, molasses, syrup, sugar, chocolate syrup, gumdrops, marshmallows.  Avoid: Desserts, cookies, or candies that contain nuts, peanut butter, dried fruits. Jams, preserves with seeds, marmalade. Fats and Oils  Allowed:Margarine, butter, cream, mayonnaise, salad oils, plain salad dressings made from allowed foods.  Avoid: Seeds, nuts, olives. Beverages  Allowed: All, except those listed to avoid.  Avoid: Fruit juices with high pulp, prune juice. Condiments  Allowed:Ketchup, mustard, horseradish, vinegar, cream sauce, cheese sauce, cocoa powder. Spices in moderation: allspice, basil, bay leaves, celery powder or leaves, cinnamon, cumin powder, curry powder, ginger, mace, marjoram, onion or garlic powder, oregano, paprika, parsley flakes, ground pepper, rosemary, sage, savory, tarragon, thyme, turmeric.  Avoid: Coconut, pickles. SAMPLE MENU Breakfast   cup orange juice.  1 boiled egg.  1 slice white toast.  Margarine.   cup cornflakes.  1 cup milk.  Beverage. Lunch   cup chicken noodle soup.  2 to 3 oz sliced roast beef.  2 slices white bread.  Mayonnaise.   cup tomato juice.  1 small banana.  Beverage. Dinner  3 oz baked chicken.   cup scalloped potatoes.   cup cooked beets.  White dinner roll.  Margarine.   cup canned peaches.  Beverage. Document Released: 01/18/2002 Document Revised: 10/21/2011 Document Reviewed: 08/15/2011 [facility] Patient Information 2014 [place], [place].  Dr Gwendlyn Deutscher

## 2013-02-05 NOTE — Progress Notes (Signed)
[name] 07/12/1966 MRN [id number]   History of Present Illness:  This is a 47 year old white female with a new diagnosis of Crohn's colitis based on a colonoscopy in December 2013. Biopsies from the left and right colon showed multiple granulomas. There was some friability and active colitis in the sigmoid colon. She has been on Asacol 4.8 g daily and prednisone 30 mg daily.She initially improved but then flared up again when she reduced the steroids to 69m, she is now back on 333m She is still having 5-6 bloody bowel movements a day. She has crampy abdominal pain and some urgency and incontinence. She has lost about 4 pounds. She takes iron supplements. There is a history of breast cancer and anemia. Her brother has dermatomyositis and her mother had autoimmune disease.   Past Medical History  Diagnosis Date  . HX: breast cancer 11/98 and 12/03    recurrance  . Hypothyroidism   . History of MRI of cervical spine 4/03    mild DJD  . History of bone density study     no osteopenia  . Anxiety   . Asthma   . Chronic headaches   . IBS (irritable bowel syndrome)   . Inguinal hernia   . Granulomatous colitis    Past Surgical History  Procedure Laterality Date  . Total abdominal hysterectomy w/ bilateral salpingoophorectomy  2007    left cervix in place  . Inguinal hernia repair      right  . Mastectomy      right    reports that she has never smoked. She has never used smokeless tobacco. She reports that  drinks alcohol. She reports that she does not use illicit drugs. family history includes Breast cancer in her paternal aunt; Colon polyps in her brother and mother; Dermatomyositis in her brother; Hypertension in her mother; Irritable bowel syndrome in her mother; Psoriasis in her brother; and Skin cancer in her father.  There is no history of Diabetes, and Heart disease, and Stroke, . Allergies  Allergen Reactions  . Sulfa Antibiotics Rash        Review of Systems:  Denies heartburn indigestion  The remainder of the 10 point ROS is negative except as outlined in H&P   Physical Exam: General appearance  Well developed, in no distress. Eyes- non icteric. HEENT nontraumatic, normocephalic. Mouth no lesions, tongue papillated, no cheilosis. Neck supple without adenopathy, thyroid not enlarged, no carotid bruits, no JVD. Lungs Clear to auscultation bilaterally. Cor normal S1, normal S2, regular rhythm, no murmur,  quiet precordium. Abdomen: Soft nontender with normoactive bowel sounds. Status post abdominoplasty. Rectal: Not done. Extremities no pedal edema. Skin no lesions. Neurological alert and oriented x 3.. No tremor Psychological normal mood and affect.  Assessment and Plan:  Problem #1 [id number] old white female with granulomatous colitis of 6 months duration which is somewhat improved on 30 mg of prednisone but still very symptomatic. We willo increase the prednisone to 40 mg a day. She will remain on mesalamine 4.8 g daily and I have instructed her on a low residue diet. She will take Lomotil one tablet every morning We will obtain IBD markers, blood count and iron studies. She will [place] an update in 2 weeks and I will see her in 8 weeks. If she does not respond to steroids, I would consider biologicals. I have discussed this with the patient and she is in agreement to follow all our instructions.   02/05/2013 DoDelfin Edis

## 2013-02-15 ENCOUNTER — Ambulatory Visit: Payer: Self-pay | Admitting: Internal Medicine

## 2013-03-18 ENCOUNTER — Telehealth: Payer: Self-pay | Admitting: Oncology

## 2013-03-18 NOTE — Telephone Encounter (Signed)
pt going to be out of town and needed to r/s.Marland KitchenMarland KitchenDone

## 2013-03-25 ENCOUNTER — Other Ambulatory Visit: Payer: Self-pay | Admitting: Oncology

## 2013-03-25 DIAGNOSIS — Z853 Personal history of malignant neoplasm of breast: Secondary | ICD-10-CM

## 2013-04-01 ENCOUNTER — Ambulatory Visit: Payer: Self-pay | Admitting: Oncology

## 2013-04-06 ENCOUNTER — Other Ambulatory Visit: Payer: Self-pay | Admitting: Oncology

## 2013-04-06 ENCOUNTER — Ambulatory Visit
Admission: RE | Admit: 2013-04-06 | Discharge: 2013-04-06 | Disposition: A | Discharge status: Home / Self Care | Payer: PRIVATE HEALTH INSURANCE | Source: Ambulatory Visit | Attending: Oncology | Admitting: Oncology

## 2013-04-06 DIAGNOSIS — Z853 Personal history of malignant neoplasm of breast: Secondary | ICD-10-CM

## 2013-04-14 ENCOUNTER — Telehealth: Payer: Self-pay | Admitting: *Deleted

## 2013-04-14 NOTE — Telephone Encounter (Signed)
Message from pt reporting her bone density scan was denied by insurance due to improper coding. Per pt scan was ordered for osteopenia when it should have been ordered as a screening test for pt with breast cancer on Femara. Will forward inquiry to managed care dept for follow up.

## 2013-04-30 ENCOUNTER — Telehealth: Payer: Self-pay | Admitting: *Deleted

## 2013-04-30 ENCOUNTER — Encounter: Payer: Self-pay | Admitting: Family Medicine

## 2013-04-30 ENCOUNTER — Ambulatory Visit (INDEPENDENT_AMBULATORY_CARE_PROVIDER_SITE_OTHER): Payer: PRIVATE HEALTH INSURANCE | Admitting: Family Medicine

## 2013-04-30 VITALS — BP 97/77 | HR 82 | Temp 98.8°F | Ht 66.0 in | Wt 142.6 lb

## 2013-04-30 DIAGNOSIS — J309 Allergic rhinitis, unspecified: Secondary | ICD-10-CM

## 2013-04-30 DIAGNOSIS — J302 Other seasonal allergic rhinitis: Secondary | ICD-10-CM

## 2013-04-30 MED ORDER — GUAIFENESIN 200 MG PO TABS: 200.0000 mg | ORAL_TABLET | ORAL | Status: DC | PRN

## 2013-04-30 MED ORDER — CETIRIZINE HCL 10 MG PO TABS: 10.0000 mg | ORAL_TABLET | Freq: Every day | ORAL | Status: DC

## 2013-04-30 NOTE — Patient Instructions (Addendum)
It seems that you have an exacerbation of your allergies. Please start taking Cetirizine 55m daily and continue using Flonase. Also I have prescribed you Guaifenesin take it as prescribed to break up mucus. Please watch you condition and if you develop difficulty breathing, wheezing fever of sputum production get re-evaluated.

## 2013-04-30 NOTE — Progress Notes (Signed)
Family Medicine Office Visit Note   Subjective:   Patient ID: [name], female  DOB: 12/13/1965, 47 y.o.. MRN: [id number]   Pt that comes today for same day appointment complaining of mild chest tightening felt only with deep breathing. She reports has seasonal allergies and this happens to her frequently. She has had this issue for about a week with no worsening or getting any better. Patient denies fever or, chills, nausea, vomiting, or any other systemic symptoms.  Review of Systems:  Pt denies SOB, chest pain, palpitations, headaches, dizziness.  Objective:   Physical Exam: Gen:  NAD HEENT: Moist mucous membranes. Mild erythema of her oropharynx without exudate. CV: Regular rate and rhythm, no murmurs rubs or gallops PULM: Clear to auscultation bilaterally. No wheezes/rales/rhonchi ABD: Soft, non tender, non distended, normal bowel sounds EXT: No edema Neuro: Alert and oriented x3. No focalization  Assessment & Plan:

## 2013-04-30 NOTE — Telephone Encounter (Signed)
Pt calling to follow up on bone density billing issue. Spoke with Tammy in billing at [facility], pt has osteopenia so scan was ordered as a diagnostic scan. Their records show scan was approved, applied to pt's deductible. Returned call to pt, recommended she contact [place] to clarify.

## 2013-04-30 NOTE — Assessment & Plan Note (Signed)
Patient already on Flonase, using it intermittently. Normal vitals and unremarkable physical exam. Plan/ Cetirizine10 mg daily Continue with Flonase and added guaifenesin to help with congestion. Discussed signs of worsening condition that should prompt re-evaluation.

## 2013-05-04 ENCOUNTER — Ambulatory Visit (HOSPITAL_BASED_OUTPATIENT_CLINIC_OR_DEPARTMENT_OTHER): Payer: PRIVATE HEALTH INSURANCE | Admitting: Oncology

## 2013-05-04 ENCOUNTER — Telehealth: Payer: Self-pay | Admitting: Oncology

## 2013-05-04 VITALS — BP 115/78 | HR 72 | Temp 97.9°F | Resp 18 | Ht 66.0 in | Wt 140.9 lb

## 2013-05-04 DIAGNOSIS — C50919 Malignant neoplasm of unspecified site of unspecified female breast: Secondary | ICD-10-CM

## 2013-05-04 NOTE — Telephone Encounter (Signed)
Gave pt appt for lMammogram Dec[place]  and MD on  June 2014

## 2013-05-04 NOTE — Progress Notes (Signed)
   [place]    OFFICE PROGRESS NOTE   INTERVAL HISTORY:   She returns as scheduled. No hot flashes or arthralgias. She continues Femara. No palpable change at the left breast or right chest wall. A breast ultrasound was negative on 04/06/2013.  She is followed by Dr. Olevia Perches for treatment of Crohn's disease. She reports arthralgias when she was taking prednisone. She is no longer taking prednisone.  Objective:  Vital signs in last 24 hours:  Blood pressure 115/78, pulse 72, temperature 97.9 F (36.6 C), temperature source Oral, resp. rate 18, height 5' 6"  (1.676 m), weight 140 lb 14.4 oz (63.912 kg).    HEENT: Neck without mass Lymphatics: No cervical, supraclavicular, or axillary nodes Resp: Lungs clear bilaterally Cardio: Regular rate and rhythm, 2/6 systolic murmur GI: No hepatomegaly Vascular: No leg edema Breast: Status post right mastectomy with a TRAM reconstruction. Firm tissue at the medial aspect of the TRAM. No evidence for chest wall tumor recurrence. Left breast without mass.      Medications: I have reviewed the patient's current medications.  Assessment/Plan: [name] was diagnosed with right-sided breast cancer in November 1998. She developed a right breast recurrence in December 2003 and underwent a right mastectomy/TRAM reconstruction. She completed adjuvant AC and Taxol chemotherapy. She took tamoxifen from August 2004 through July 2009. She began Femara on 04/12/2008. She remains in clinical remission.    Disposition:  She has completed 5 years of Femara. We discussed the indication for continuing Femara. She has completed 10 years of hormonal therapy since undergoing the mastectomy/adjuvant chemotherapy in 2003/2004. We discussed ongoing studies evaluating 5 versus 10 years of aromatase inhibitor therapy and data suggesting an improvement with 10 years as opposed to 5 years of tamoxifen. I explained there is no recommended "standard"  length of hormonal therapy in her case. She is most comfortable discontinuing Femara at this point. She will complete the remaining 3 months of Femara on the prescription that was just filled.  Ms. Zahm will be scheduled for a mammogram in December of 2014. She will return for an office visit in 9 months. [name], MD  05/04/2013  3:59 PM

## 2013-06-24 ENCOUNTER — Telehealth: Payer: Self-pay | Admitting: *Deleted

## 2013-06-24 NOTE — Telephone Encounter (Signed)
VM requesting office notes to send off with some re-filed claims she has. Forwarded request to HIM department to contact patient to get these to her.

## 2013-07-15 ENCOUNTER — Other Ambulatory Visit: Payer: Self-pay | Admitting: Family Medicine

## 2013-07-15 ENCOUNTER — Other Ambulatory Visit: Payer: Self-pay | Admitting: *Deleted

## 2013-07-15 DIAGNOSIS — C50919 Malignant neoplasm of unspecified site of unspecified female breast: Secondary | ICD-10-CM

## 2013-07-15 MED ORDER — LETROZOLE 2.5 MG PO TABS: ORAL_TABLET | ORAL | Status: DC

## 2013-07-22 ENCOUNTER — Ambulatory Visit
Admission: RE | Admit: 2013-07-22 | Discharge: 2013-07-22 | Disposition: A | Discharge status: Home / Self Care | Payer: PRIVATE HEALTH INSURANCE | Source: Ambulatory Visit | Attending: Oncology | Admitting: Oncology

## 2013-07-22 ENCOUNTER — Ambulatory Visit: Payer: Self-pay

## 2013-07-22 DIAGNOSIS — C50919 Malignant neoplasm of unspecified site of unspecified female breast: Secondary | ICD-10-CM

## 2013-07-27 ENCOUNTER — Telehealth: Payer: Self-pay | Admitting: Internal Medicine

## 2013-07-27 NOTE — Telephone Encounter (Signed)
Spoke with patient and she states she is having 5-15 bloody stools/day. She states her hair started coming out with Prednisone. She has been off Prednisone for 2 or more months. Scheduled with Alonza Bogus, PA tomorrow at 3:30 PM. (also scheduled f/u OV with Dr. Olevia Perches on 09/07/13 at 8:45 AM per Alonza Bogus, [place] )

## 2013-07-28 ENCOUNTER — Encounter: Payer: Self-pay | Admitting: Gastroenterology

## 2013-07-28 ENCOUNTER — Ambulatory Visit (INDEPENDENT_AMBULATORY_CARE_PROVIDER_SITE_OTHER): Payer: PRIVATE HEALTH INSURANCE | Admitting: Gastroenterology

## 2013-07-28 VITALS — BP 106/70 | HR 60 | Ht 66.0 in | Wt 141.0 lb

## 2013-07-28 DIAGNOSIS — K5090 Crohn's disease, unspecified, without complications: Secondary | ICD-10-CM

## 2013-07-28 MED ORDER — BUDESONIDE 9 MG PO TB24: 9.0000 mg | ORAL_TABLET | Freq: Every day | ORAL | Status: DC

## 2013-07-28 NOTE — Patient Instructions (Signed)
We have sent the following medications to your pharmacy for you to pick up at your convenience: Uceris 89m daily  You have been given lab orders to have drawn Please have them fax the results to our office at [phone] Keep your appointment with Dr. BOlevia Perchesin January                                               We are excited to introduce MyChart, a new best-in-class service that provides you online access to important information in your electronic medical record. We want to make it easier for you to view your health information - all in one secure location - when and where you need it. We expect MyChart will enhance the quality of care and service we provide.  When you register for MyChart, you can:    View your test results.    Request appointments and receive appointment reminders via email.    Request medication renewals.    View your medical history, allergies, medications and immunizations.    Communicate with your physician's office through a password-protected site.    Conveniently print information such as your medication lists.  To find out if MyChart is right for you, please talk to a member of our clinical staff today. We will gladly answer your questions about this free health and wellness tool.  If you are age 767or older and want a member of your family to have access to your record, you must provide written consent by completing a proxy form available at our office. Please speak to our clinical staff about guidelines regarding accounts for patients younger than age 48  As you activate your MyChart account and need any technical assistance, please call the MyChart technical support line at [phone] or email your question to [email or web address]. If you email your question(s), please include your name, a return phone number and the best time to reach you.  If you have non-urgent health-related questions, you can send a message to our office  through [email or web address] If you have a medical emergency, call 911.  Thank you for using MyChart as your new health and wellness resource!   MyChart licensed from [employer]  1999-2010. Patents Pending.

## 2013-07-28 NOTE — Progress Notes (Signed)
Reviewed and agree.

## 2013-07-28 NOTE — Progress Notes (Signed)
07/28/2013 [name] [id number] 1966-03-29   History of Present Illness:  This is a 47 year old white female known to Dr. Olevia Perches with a new diagnosis of Crohn's colitis based on a colonoscopy in December 2013. Biopsies from the rectum and right colon showed multiple granulomas.  She has been on Asacol 4.8 grams and was placed on prednisone some time this past spring.  She saw Dr. Olevia Perches in June of this year and her prednisone was increased to 40 mg daily for a flare at that time.  Patient says that she did well with the prednisone in regards to her Crohn's symptoms, but after she stopped the prednisone some time in September her hair fell out in large volumes.  Says that she does not want to have to take it again.  She was supposed to follow-up with Dr. Olevia Perches in August but was feeling well so she never came for a visit.  At her appt in June Dr. Olevia Perches discussed possibly starting biologics depending on her response to the prednisone.    She is here today because of a flare of her symptoms again.  States that over the past 3 weeks things have been worsening.  Having 5-15 BM's per day, but some are very low volume and sometimes she just passes mucus.  She always sees blood as well.  Has some pain, but not a lot.  Has urgency and has had accident with it recently as well.      Current Medications, Allergies, Past Medical History, Past Surgical History, Family History and Social History were reviewed in [employer] record.   Physical Exam: BP 106/70  Pulse 60  Ht 5' 6"  (1.676 m)  Wt 141 lb (63.957 kg)  BMI 22.77 kg/m2 General: Well developed white female in no acute distress Head: Normocephalic and atraumatic Eyes:  Sclerae anicteric, conjunctiva pink  Ears: Normal auditory acuity Lungs: Clear throughout to auscultation Heart: Regular rate and rhythm Abdomen: Soft, non-distended. No masses, no hepatomegaly.  Bowel sounds present.  Non-tender. Musculoskeletal:  Symmetrical with no gross deformities  Extremities: No edema  Neurological: Alert oriented x 4, grossly non-focal Psychological:  Alert and cooperative. Normal mood and affect  Assessment and Recommendations: #66 47 year old white female with granulomatous/Crohns colitis now with a flare again after just being off of prednisone since September.  Dr. Olevia Perches discussed possibly starting biologics with her at her last visit in June if she did not respond to prednisone.  She did well with the prednisone in regards to her Crohn's but lost her hair when she came off of the medication and does not want to have to take it again.  She is willing to try Uceris, however.  I will place her on 9 mg daily (she was given samples and a prescription).  She will continue her 4.8 grams of Asacol.  I will check quantiferon gold for TB, HepBsAg, and TPMT in preparation for either biologics or immunomodulators.  She has an appt with Dr. Olevia Perches in January and will keep that appointment for follow-up.

## 2013-08-07 ENCOUNTER — Other Ambulatory Visit: Payer: Self-pay | Admitting: Internal Medicine

## 2013-08-16 ENCOUNTER — Telehealth: Payer: Self-pay | Admitting: Internal Medicine

## 2013-08-16 NOTE — Telephone Encounter (Signed)
Left a message for patient to call me. 

## 2013-08-16 NOTE — Telephone Encounter (Signed)
Spoke with patient and told her we have not received her labs from her facility. She will have them to fax to [phone].

## 2013-08-19 ENCOUNTER — Telehealth: Payer: Self-pay | Admitting: Internal Medicine

## 2013-08-20 ENCOUNTER — Ambulatory Visit (INDEPENDENT_AMBULATORY_CARE_PROVIDER_SITE_OTHER): Payer: PRIVATE HEALTH INSURANCE | Admitting: Internal Medicine

## 2013-08-20 ENCOUNTER — Other Ambulatory Visit: Payer: Self-pay | Admitting: *Deleted

## 2013-08-20 ENCOUNTER — Encounter: Payer: Self-pay | Admitting: Internal Medicine

## 2013-08-20 VITALS — BP 102/66 | HR 64 | Ht 66.0 in | Wt 138.0 lb

## 2013-08-20 DIAGNOSIS — K5090 Crohn's disease, unspecified, without complications: Secondary | ICD-10-CM

## 2013-08-20 MED ORDER — BUDESONIDE 9 MG PO TB24: 9.0000 mg | ORAL_TABLET | Freq: Every day | ORAL | Status: DC

## 2013-08-20 MED ORDER — PREDNISONE 10 MG PO TABS: 10.0000 mg | ORAL_TABLET | Freq: Every day | ORAL | Status: DC

## 2013-08-20 MED ORDER — DIPHENOXYLATE-ATROPINE 2.5-0.025 MG PO TABS: 1.0000 | ORAL_TABLET | Freq: Four times a day (QID) | ORAL | Status: DC | PRN

## 2013-08-20 NOTE — Telephone Encounter (Signed)
Spoke with patient and she states she is just not getting better wit Uceris. States she wants to see Dr. Olevia Perches . Scheduled with Dr. Olevia Perches today at 2:45 PM.

## 2013-08-20 NOTE — Patient Instructions (Addendum)
We have sent the following medications to your pharmacy for you to pick up at your convenience: Lomotil four times daily as needed Uceris 9 mg daily  Your physician has requested that you have the following lab work completed. Per your request, please get these completed at the lab of your choic and have results faxed to our office: CBC, IBC, Sed Rate  We will work on getting you started on Humira.  Please follow up with Dr Olevia Perches in 6 weeks.  CC: Dr Lenor Coffin

## 2013-08-20 NOTE — Progress Notes (Signed)
[name] 1965/10/07 [id number]   History of Present Illness:  This is a 48 year old white female with Crohn's colitis diagnosed on a colonoscopy in December 2013. She initially responded to prednisone and was able to taper it down but then she relapsed and had to go back up to 40 mg a day. She developed  side effects , specifically hair loss and decided to discontinue the prednisone. She has also been on mesalamine 4.8 g daily, which can cause hair loss.. She saw [facility] 2 months ago and was started on Uceris 9 mg daily. She is still having 8-10 loose bowel movements a day but not at night. She is having accidents and there has been blood in her stool.Essentially, not much improvement according to her. She takes iron supplements. She has a history of breast cancer in 1998 with chemotherapy in 2003 and 2004. She is status post Tram flap breast reconstruction.    Past Medical History  Diagnosis Date  . HX: breast cancer 11/98 and 12/03    recurrance  . Hypothyroidism   . History of MRI of cervical spine 4/03    mild DJD  . History of bone density study     no osteopenia  . Anxiety   . Asthma   . Chronic headaches   . IBS (irritable bowel syndrome)   . Inguinal hernia   . Granulomatous colitis     Past Surgical History  Procedure Laterality Date  . Total abdominal hysterectomy w/ bilateral salpingoophorectomy  2007    left cervix in place  . Inguinal hernia repair      right  . Mastectomy      right    Allergies  Allergen Reactions  . Sulfa Antibiotics Rash    Family history and social history have been reviewed.  Review of Systems: Weight loss of 10 pounds. Positive for rectal bleeding, diarrhea denies abdominal pain  The remainder of the 10 point ROS is negative except as outlined in the H&P  Physical Exam: General Appearance Well developed, in no distress Eyes  Non icteric  HEENT  Non traumatic, normocephalic  Mouth No lesion, tongue papillated, no  cheilosis Neck Supple without adenopathy, thyroid not enlarged, no carotid bruits, no JVD Lungs Clear to auscultation bilaterally COR Normal S1, normal S2, regular rhythm, no murmur, quiet precordium Abdomen hyperactive bowel sounds. Status post Tram procedure, minimal discomfort on palpation Rectal not done Extremities  No pedal edema Skin No lesions Neurological Alert and oriented x 3 Psychological Normal mood and affect  Assessment and Plan:  Problem #1 Crohn's colitis not responsive to steroids. We have discussed biologicals. We will start of preauthorization for  Humira induction followed by maintenance therapy. She already completed a TB skin test and hepatitis serologies. We will apply for the patient assistance program. She will continue on mesalamine, Uceris and we will add prednisone 10 mg daily. We will also refill Lomotil. We are checking CBC, iron studies and sedimentation rate today. Problem #2 hx of breast cancer, she is asking if she could administer Humira into her transplanted breast tissue because it feels numb, and think it would be OK, will check with the drug rep.   Delfin Edis 08/20/2013

## 2013-08-20 NOTE — Telephone Encounter (Signed)
Left a message for patient to call me. 

## 2013-08-21 ENCOUNTER — Encounter: Payer: Self-pay | Admitting: Internal Medicine

## 2013-08-31 ENCOUNTER — Telehealth: Payer: Self-pay | Admitting: Internal Medicine

## 2013-09-01 MED ORDER — ADALIMUMAB 40 MG/0.8ML ~~LOC~~ KIT: 40.0000 mg | PACK | SUBCUTANEOUS | Status: DC

## 2013-09-01 NOTE — Telephone Encounter (Signed)
Humira rx clarified. Okayed maintanence dose.

## 2013-09-01 NOTE — Telephone Encounter (Signed)
I have spoken to [employer] Rep. Originally, benefits summary said that "prior authorization is required in order for this medication to be covered by the patient's insurance and was initiated on August 24, 2013." Therefore, I was not aware that an authorization had not been done. I have just received a prior auth form to fill out which I have completed. This will be faxed today.

## 2013-09-02 NOTE — Telephone Encounter (Signed)
I have spoken to [employer] manager) at [employer]. He states that new insurance information has been given to [employer] but the system has not yet been updated. He will ask someone to do this. I have also received a note from [employer] administrators stating that patient's Humira is an eligible expense under the prescription drug benefit. I have advised patient of this and she will contact us with any further issues.

## 2013-09-07 ENCOUNTER — Telehealth: Payer: Self-pay | Admitting: Internal Medicine

## 2013-09-07 ENCOUNTER — Ambulatory Visit: Payer: Self-pay | Admitting: Internal Medicine

## 2013-09-07 NOTE — Telephone Encounter (Signed)
I have spoken to Vicente Males [employer] program for Humira) who has attempted to contact patient on multiple occasions to discuss benefits etc. Patient states that she has not heard from [place]. Vicente Males will contact patient again to speak with her. Patient verbalizes understanding.

## 2013-09-20 ENCOUNTER — Other Ambulatory Visit: Payer: Self-pay | Admitting: *Deleted

## 2013-09-20 ENCOUNTER — Telehealth: Payer: Self-pay | Admitting: Internal Medicine

## 2013-09-20 MED ORDER — CLONAZEPAM 1 MG PO TABS: 1.0000 mg | ORAL_TABLET | Freq: Two times a day (BID) | ORAL | Status: DC

## 2013-09-20 NOTE — Telephone Encounter (Signed)
Spoke with patient and sent rx to pharmacy.

## 2013-09-20 NOTE — Telephone Encounter (Signed)
Patient states she is having anxiety and depression due to Crohn's. Patient states she is "homebound" except for going to work because she is afraid she will have an accident. States she is not sleeping. She is asking for something for anxiety/depression. She states she was on Effexor in past and did not like it so she does not want it or anything in its class. Please, advise.

## 2013-09-20 NOTE — Telephone Encounter (Signed)
Per Carla Drape, patient is to call the ambassador to help with getting Humria. She will see if patient has done this.

## 2013-09-20 NOTE — Telephone Encounter (Signed)
Reviewed. Klonopin 1 mg, 1 po bid #60, 1 refill. We were in the process of preauthorizing her for Humira, it sounds like she ought to be on it. Can you  Check with [facility] please? thanx db

## 2013-09-21 ENCOUNTER — Encounter: Payer: Self-pay | Admitting: Internal Medicine

## 2013-09-21 NOTE — Telephone Encounter (Signed)
Error

## 2013-09-22 ENCOUNTER — Telehealth: Payer: Self-pay | Admitting: Internal Medicine

## 2013-09-22 NOTE — Telephone Encounter (Signed)
I have spoken to patient. She would rather me send Humira script elsewhere. She still has not returned Vicente Males with Humira's calls. Vicente Males states that she has attempted to reach the patient several times and patient will not return her call. Patient states that she has never heard from [facility]. I have given her Anna's number and have asked patient to touch base with Vicente Males to make certain that she is getting the best pricing for Humira etc. She verbalizes understanding and will contact me with whatever pharmacy she chooses to use for Humira.

## 2013-11-09 ENCOUNTER — Other Ambulatory Visit (HOSPITAL_COMMUNITY)
Admission: RE | Admit: 2013-11-09 | Discharge: 2013-11-09 | Disposition: A | Discharge status: Home / Self Care | Payer: PRIVATE HEALTH INSURANCE | Source: Ambulatory Visit | Attending: Family Medicine | Admitting: Family Medicine

## 2013-11-09 ENCOUNTER — Ambulatory Visit (INDEPENDENT_AMBULATORY_CARE_PROVIDER_SITE_OTHER): Payer: PRIVATE HEALTH INSURANCE | Admitting: Family Medicine

## 2013-11-09 ENCOUNTER — Encounter: Payer: Self-pay | Admitting: Family Medicine

## 2013-11-09 VITALS — BP 112/75 | HR 62 | Temp 97.6°F | Ht 66.0 in | Wt 129.0 lb

## 2013-11-09 DIAGNOSIS — F341 Dysthymic disorder: Secondary | ICD-10-CM

## 2013-11-09 DIAGNOSIS — Z01419 Encounter for gynecological examination (general) (routine) without abnormal findings: Secondary | ICD-10-CM | POA: Insufficient documentation

## 2013-11-09 DIAGNOSIS — Z113 Encounter for screening for infections with a predominantly sexual mode of transmission: Secondary | ICD-10-CM | POA: Insufficient documentation

## 2013-11-09 DIAGNOSIS — Z0000 Encounter for general adult medical examination without abnormal findings: Secondary | ICD-10-CM

## 2013-11-09 DIAGNOSIS — E039 Hypothyroidism, unspecified: Secondary | ICD-10-CM

## 2013-11-09 DIAGNOSIS — W19XXXA Unspecified fall, initial encounter: Secondary | ICD-10-CM | POA: Insufficient documentation

## 2013-11-09 DIAGNOSIS — Y92009 Unspecified place in unspecified non-institutional (private) residence as the place of occurrence of the external cause: Secondary | ICD-10-CM

## 2013-11-09 DIAGNOSIS — F418 Other specified anxiety disorders: Secondary | ICD-10-CM

## 2013-11-09 DIAGNOSIS — Z124 Encounter for screening for malignant neoplasm of cervix: Secondary | ICD-10-CM

## 2013-11-09 DIAGNOSIS — IMO0001 Reserved for inherently not codable concepts without codable children: Secondary | ICD-10-CM

## 2013-11-09 LAB — TSH: TSH: 1.228 u[IU]/mL (ref 0.350–4.500)

## 2013-11-09 MED ORDER — LEVOTHYROXINE SODIUM 88 MCG PO TABS: 88.0000 ug | ORAL_TABLET | Freq: Every day | ORAL | Status: DC

## 2013-11-09 MED ORDER — TOLTERODINE TARTRATE ER 4 MG PO CP24: 4.0000 mg | ORAL_CAPSULE | Freq: Every day | ORAL | Status: DC

## 2013-11-09 MED ORDER — ESCITALOPRAM OXALATE 10 MG PO TABS: 10.0000 mg | ORAL_TABLET | Freq: Every day | ORAL | Status: DC

## 2013-11-09 MED ORDER — CLONAZEPAM 1 MG PO TABS: 1.0000 mg | ORAL_TABLET | Freq: Two times a day (BID) | ORAL | Status: DC

## 2013-11-09 NOTE — Patient Instructions (Signed)

## 2013-11-09 NOTE — Assessment & Plan Note (Signed)
Appear physically healthy for age. Up to date with mammogram s/p mastectomy of her right breast. Last mammogram in Dec 2014 of her left breast reviewed and was normal. Following up with GI for colonoscopy for her crohn's dx. PAP smear obtained today.

## 2013-11-09 NOTE — Progress Notes (Signed)
Patient ID: [name], female   DOB: 1965-11-08, 48 y.o.   MRN: [id number] Subjective:     [name] is a 48 y.o. female and is here for a comprehensive physical exam. The patient reports problems - Anxiety and Depression. She need medication refill and want to start somethng for depression,she had been on Effexor before,moe than 5 yrs ago,but she stopped the medication, in the last few months she has started feeling depressed again. She had counseling in the past and is not interested in counseling now. Fall: She falls frequently due to poor diet. Hypothyroidism: She is here for lab follow up and medication refill.  History   Social History  . Marital Status: Divorced    Spouse Name: N/A    Number of Children: 0  . Years of Education: N/A   Occupational History  . managed care    Social History Main Topics  . Smoking status: Never Smoker   . Smokeless tobacco: Never Used  . Alcohol Use: Yes     Comment: occas  . Drug Use: No  . Sexual Activity: Not on file   Other Topics Concern  . Not on file   Social History Narrative  . No narrative on file   Health Maintenance  Topic Date Due  . Influenza Vaccine  03/12/2014  . Colonoscopy  07/14/2017  . Tetanus/tdap  09/13/2019    The following portions of the patient's history were reviewed and updated as appropriate: allergies, current medications, past family history, past medical history, past social history, past surgical history and problem list.  Review of Systems Pertinent items are noted in HPI.   Objective:   Physical Exam  Nursing note and vitals reviewed. Constitutional: She is oriented to person, place, and time. She appears well-developed. No distress.  HENT:  Head: Normocephalic and atraumatic.  Eyes: EOM are normal. Pupils are equal, round, and reactive to light.  Neck: Normal range of motion. Neck supple.  Cardiovascular: Normal rate, regular rhythm, normal heart sounds and intact distal pulses.   No  murmur heard. Pulmonary/Chest: Effort normal and breath sounds normal. No respiratory distress. She has no wheezes.  Abdominal: Soft. Bowel sounds are normal. She exhibits no distension and no mass. There is no tenderness.  Genitourinary: Vagina normal. Pelvic exam was performed with patient supine. There is no rash or lesion on the right labia. There is no rash or lesion on the left labia. Right adnexum displays no mass. Left adnexum displays no mass.    Musculoskeletal: Normal range of motion.  Neurological: She is alert and oriented to person, place, and time. She has normal reflexes.  Skin: Skin is warm.     Assessment:    Healthy female exam.  Anxiety and Depression Fall Hypothyroidism     Plan:     See After Visit Summary for Counseling Recommendations  Check problem list too.

## 2013-11-09 NOTE — Assessment & Plan Note (Signed)
Due to poor nutrition. Her gait seem steady today. Patient advised to improve her diet at least 3 meal daily.  I recommended health coach and nutritionist referral but she is not interested at this time. She will work on her diet. Patient to return soon if symptom worsens.

## 2013-11-09 NOTE — Assessment & Plan Note (Signed)
PHQ9 of 7 I refilled Klonopin prn anxiety. Started Lexapro for anxiety and depression. I recommended psychologic counseling in addition to pharmacologic treatment but she declined at this time. She is currently stable and not at risk to self or others. Patient stated she just wants to get better. F/U in 1-2 months for reassessment.

## 2013-11-10 ENCOUNTER — Encounter: Payer: Self-pay | Admitting: Family Medicine

## 2013-12-06 ENCOUNTER — Telehealth: Payer: Self-pay | Admitting: Internal Medicine

## 2013-12-06 MED ORDER — MESALAMINE 800 MG PO TBEC: 3.0000 | DELAYED_RELEASE_TABLET | Freq: Two times a day (BID) | ORAL | Status: DC

## 2013-12-06 NOTE — Telephone Encounter (Signed)
Rx sent. Patient advised.

## 2013-12-29 ENCOUNTER — Telehealth: Payer: Self-pay | Admitting: Oncology

## 2013-12-29 NOTE — Telephone Encounter (Signed)
Called pt r/s appt due to call left message

## 2014-01-11 ENCOUNTER — Ambulatory Visit (INDEPENDENT_AMBULATORY_CARE_PROVIDER_SITE_OTHER): Payer: PRIVATE HEALTH INSURANCE | Admitting: Family Medicine

## 2014-01-11 ENCOUNTER — Encounter: Payer: Self-pay | Admitting: Family Medicine

## 2014-01-11 VITALS — BP 107/73 | HR 67 | Temp 98.0°F | Ht 66.0 in | Wt 125.0 lb

## 2014-01-11 DIAGNOSIS — F341 Dysthymic disorder: Secondary | ICD-10-CM

## 2014-01-11 DIAGNOSIS — F418 Other specified anxiety disorders: Secondary | ICD-10-CM

## 2014-01-11 DIAGNOSIS — G4700 Insomnia, unspecified: Secondary | ICD-10-CM

## 2014-01-11 NOTE — Assessment & Plan Note (Signed)
Improved a lot on Lexapro. Continue current dose for now.

## 2014-01-11 NOTE — Assessment & Plan Note (Deleted)
Improved a lot on Lexapro. Continue current dose for now.

## 2014-01-11 NOTE — Assessment & Plan Note (Signed)
S/E of Lexapro reviewed and discussed with her, it does cause insomnia. She is doing well on this medication. She has not been taking her Klonopin often. I suggested taking Lexapro in the morning and Klonopin at bed time. Hopefully this will her improve her symptom. Patient instructed to follow up soon if no improvement, then I will take her off Lexapro all together.

## 2014-01-11 NOTE — Progress Notes (Signed)
Subjective:     Patient ID: [name], female   DOB: 05/30/1966, 48 y.o.   MRN: [id number]  HPI Insomnia: Since she started Lexapro she has had difficulty falling asleep and staying asleep. She is here to discuss medication. Anxiety/Depression: Patient stated Lexapro worked like Oceanographer, 3 weeks after she started medication, her anxiety, depression and crying spell stopped, she is happy about that but worried about associated insomnia.  Current Outpatient Prescriptions on File Prior to Visit  Medication Sig Dispense Refill  . adalimumab (HUMIRA PEN) 40 MG/0.8ML injection Inject 0.8 mLs (40 mg total) into the skin every 14 (fourteen) days.  2 each  0  . Calcium Carb-Cholecalciferol (CALCIUM + D3 PO) Take 1 tablet by mouth daily.      Marland Kitchen escitalopram (LEXAPRO) 10 MG tablet Take 1 tablet (10 mg total) by mouth daily.  90 tablet  1  . ferrous sulfate (SLOW FE) 160 (50 FE) MG TBCR Take 1 tablet (160 mg total) by mouth daily.  30 each  0  . levothyroxine (SYNTHROID) 88 MCG tablet Take 1 tablet (88 mcg total) by mouth daily.  90 tablet  1  . Mesalamine (ASACOL HD) 800 MG TBEC Take 3 tablets (2,400 mg total) by mouth 2 (two) times daily.  540 tablet  0  . Multiple Vitamin (MULTIVITAMIN) tablet Take 1 tablet by mouth daily.      . cetirizine (ZYRTEC) 10 MG tablet Take 1 tablet (10 mg total) by mouth daily.  30 tablet  11  . clonazePAM (KLONOPIN) 1 MG tablet Take 1 tablet (1 mg total) by mouth 2 (two) times daily.  60 tablet  1  . diphenoxylate-atropine (LOMOTIL) 2.5-0.025 MG per tablet Take 1 tablet by mouth 4 (four) times daily as needed for diarrhea or loose stools.  120 tablet  0  . fluticasone (FLONASE) 50 MCG/ACT nasal spray Place 2 sprays into the nose daily. In each nostril  16 g  1  . ibuprofen (ADVIL,MOTRIN) 200 MG tablet TAKE 1 TO 2 TABLETS BY MOUTH FOUR TIMES DAILY AS NEEDED  100 tablet  0  . mesalamine (CANASA) 1000 MG suppository Place 1,000 mg rectally at bedtime.      Marland Kitchen PAIN RELIEVER  EXTRA STRENGTH 500 MG tablet TAKE 2 TABLETS BY MOUTH FOUR TIMES DAILY AS NEEDED  150 tablet  0  . tolterodine (DETROL LA) 4 MG 24 hr capsule Take 1 capsule (4 mg total) by mouth daily.  90 capsule  1  . Vaginal Lubricant (REPLENS) GEL USE AS DIRECTED  35 g  0  . [DISCONTINUED] tolterodine (DETROL LA) 4 MG 24 hr capsule Take 1 capsule (4 mg total) by mouth daily.  30 capsule  11   No current facility-administered medications on file prior to visit.   Past Medical History  Diagnosis Date  . HX: breast cancer 11/98 and 12/03    recurrance  . Hypothyroidism   . History of MRI of cervical spine 4/03    mild DJD  . History of bone density study     no osteopenia  . Anxiety   . Asthma   . Chronic headaches   . IBS (irritable bowel syndrome)   . Inguinal hernia   . Granulomatous colitis       Review of Systems  Respiratory: Negative.   Cardiovascular: Negative.   Gastrointestinal: Negative.   Genitourinary: Negative.   Psychiatric/Behavioral: Positive for sleep disturbance. Negative for suicidal ideas, self-injury, decreased concentration and agitation. The patient is not nervous/anxious.  All other systems reviewed and are negative.  Filed Vitals:   01/11/14 1012  BP: 107/73  Pulse: 67  Temp: 98 F (36.7 C)  TempSrc: Oral  Height: 5' 6"  (1.676 m)  Weight: 125 lb (56.7 kg)       Objective:   Physical Exam  Nursing note and vitals reviewed. Constitutional: She is oriented to person, place, and time. She appears well-developed. No distress.  Cardiovascular: Normal rate, regular rhythm and normal heart sounds.   No murmur heard. Pulmonary/Chest: Effort normal and breath sounds normal. No respiratory distress. She has no wheezes.  Abdominal: Soft. Bowel sounds are normal. She exhibits no distension and no mass. There is no tenderness.  Musculoskeletal: Normal range of motion. She exhibits no edema.  Neurological: She is alert and oriented to person, place, and time. No  cranial nerve deficit.  Psychiatric: She has a normal mood and affect. Her behavior is normal. Judgment and thought content normal. Her speech is not rapid and/or pressured. Cognition and memory are normal.       Assessment:     Insomnia  Anxiety with depression     Plan:     Check problem list.

## 2014-01-11 NOTE — Patient Instructions (Signed)

## 2014-02-01 ENCOUNTER — Telehealth: Payer: Self-pay | Admitting: Oncology

## 2014-02-01 ENCOUNTER — Ambulatory Visit (HOSPITAL_BASED_OUTPATIENT_CLINIC_OR_DEPARTMENT_OTHER): Payer: PRIVATE HEALTH INSURANCE | Admitting: Oncology

## 2014-02-01 ENCOUNTER — Ambulatory Visit: Payer: Self-pay | Admitting: Oncology

## 2014-02-01 VITALS — BP 118/80 | HR 78 | Temp 98.6°F | Resp 18 | Ht 66.0 in | Wt 121.5 lb

## 2014-02-01 DIAGNOSIS — C50919 Malignant neoplasm of unspecified site of unspecified female breast: Secondary | ICD-10-CM

## 2014-02-01 DIAGNOSIS — Z853 Personal history of malignant neoplasm of breast: Secondary | ICD-10-CM

## 2014-02-01 DIAGNOSIS — K5090 Crohn's disease, unspecified, without complications: Secondary | ICD-10-CM

## 2014-02-01 NOTE — Telephone Encounter (Signed)
Gave pt appt for Md and mammography for December 2015

## 2014-02-01 NOTE — Progress Notes (Signed)
  [facility] OFFICE PROGRESS NOTE   Diagnosis: Breast cancer  INTERVAL HISTORY:   She returns as scheduled. Intermittent mild discomfort at the medial aspect of the right TRAM. Her chief complaint is diarrhea related to Crohn's disease. The diarrhea is much improved since beginning Humira. She is followed by Dr. Olevia Perches. She relates weight loss to the Crohn's disease.  Objective:  Vital signs in last 24 hours:  Blood pressure 118/80, pulse 78, temperature 98.6 F (37 C), temperature source Oral, resp. rate 18, height 5' 6"  (1.676 m), weight 121 lb 8 oz (55.112 kg), SpO2 100.00%.    HEENT: Neck without mass Lymphatics: No cervical or supraclavicular nodes, "shotty "bilateral axillary nodes Resp: Lungs clear bilaterally Cardio: Regular rate and rhythm GI: No hepatomegaly Vascular: No leg edema Breast: Status post right mastectomy with a TRAM reconstruction. Firm tissue at the medial side of the TRAM, no evidence for chest wall tumor recurrence. Left breast without mass  Imaging:  Left mamogram 07/23/2013- negative. Medications: I have reviewed the patient's current medications.  Assessment/Plan: Ms. Calderone was diagnosed with right-sided breast cancer in November 1998. She developed a right breast recurrence in December 2003 and underwent a right mastectomy/TRAM reconstruction. She completed adjuvant AC and Taxol chemotherapy. She took tamoxifen from August 2004 through July 2009. She began Femara on 04/12/2008, discontinued at the end of 2014. She remains in clinical remission.    Disposition:  She will return for an office visit in 9 months.  She will continue f/u with Dr. Olevia Perches for management of Crohn's disease.  [name], MD  02/01/2014  4:13 PM

## 2014-02-10 ENCOUNTER — Other Ambulatory Visit: Payer: Self-pay | Admitting: Internal Medicine

## 2014-02-15 ENCOUNTER — Telehealth: Payer: Self-pay | Admitting: *Deleted

## 2014-02-15 NOTE — Telephone Encounter (Signed)
Dr Olevia Perches, I have received a phone call from [facility] requesting refills for patient's Humira. However, it appears at her office visit in 08/2013, we asked her to follow up with [place] in 6 weeks. She has not done that. It also appears (unless I am missing something) that the last script sent for Humira was for #2 on 09/01/13 (so she should have been on 10/02/13). It looks like I initially had a difficult time getting Humira started for patient due to no return phone calls to the [facility] from the patient etc... Does patient need to come for office visit prior to Humira if she truly has not gotten any since February?

## 2014-02-15 NOTE — Telephone Encounter (Signed)
I agree she needs an appointment. I don't think she has been compliant.

## 2014-02-16 MED ORDER — ADALIMUMAB 40 MG/0.8ML ~~LOC~~ AJKT: 1.0000 "pen " | AUTO-INJECTOR | SUBCUTANEOUS | Status: DC

## 2014-02-16 NOTE — Telephone Encounter (Signed)
Per Mia Creek, pharmacist at [facility], patient got Humira in February, April, beginning of May and end of May as well as 02/10/14. I do not see where we ever sent a script to [facility] but apparently she is getting the rx. I have called and spoken to patient to advise that she does need office visit as she was actually supposed to be seen 6 weeks from her visit in January. She has scheduled an appointment for 03/08/14 @ 2:00 pm. I will send her enough Humira to get her through until her office visit on 03/08/14. She verbalizes understanding although she states that she "really dont want to come in" because "Im doing fine."

## 2014-03-04 ENCOUNTER — Other Ambulatory Visit: Payer: Self-pay | Admitting: Internal Medicine

## 2014-03-08 ENCOUNTER — Ambulatory Visit (INDEPENDENT_AMBULATORY_CARE_PROVIDER_SITE_OTHER): Payer: PRIVATE HEALTH INSURANCE | Admitting: Internal Medicine

## 2014-03-08 ENCOUNTER — Encounter: Payer: Self-pay | Admitting: Internal Medicine

## 2014-03-08 VITALS — BP 98/70 | HR 60 | Ht 66.0 in | Wt 125.0 lb

## 2014-03-08 DIAGNOSIS — K5090 Crohn's disease, unspecified, without complications: Secondary | ICD-10-CM

## 2014-03-08 DIAGNOSIS — K602 Anal fissure, unspecified: Secondary | ICD-10-CM

## 2014-03-08 MED ORDER — MOMETASONE FUROATE 0.1 % EX CREA: 1.0000 "application " | TOPICAL_CREAM | Freq: Three times a day (TID) | CUTANEOUS | Status: DC | PRN

## 2014-03-08 MED ORDER — ADALIMUMAB 40 MG/0.8ML ~~LOC~~ AJKT: 40.0000 mg | AUTO-INJECTOR | SUBCUTANEOUS | Status: DC

## 2014-03-08 MED ORDER — DIPHENOXYLATE-ATROPINE 2.5-0.025 MG PO TABS: 1.0000 | ORAL_TABLET | Freq: Four times a day (QID) | ORAL | Status: DC | PRN

## 2014-03-08 NOTE — Progress Notes (Signed)
Spruha Weight August 01, 1966 [id number]  Note: This dictation was prepared with Dragon digital system. Any transcriptional errors that result from this procedure are unintentional.   History of Present Illness:  This is a 48 year old white female with Crohn's colitis diagnosed on a colonoscopy in December 2013 which initially responded to a prednisone taper but relapsed and declined further steroid treatment. She was started on a Humira induction doses and maintenance therapy of 40 mg SQ every 2 weeks in January 2015. She has been much improved. She still takes mesalamine HD 800 mg, 3 tablets twice a day. She denies diarrhea, rectal bleeding or abdominal pain. She has developed an anal fissure in the intergluteal crease .Marland Kitchen It is very sore, itchy and at times painful.    Past Medical History  Diagnosis Date  . HX: breast cancer 11/98 and 12/03    recurrance  . Hypothyroidism   . History of MRI of cervical spine 4/03    mild DJD  . History of bone density study     no osteopenia  . Anxiety   . Asthma   . Chronic headaches   . IBS (irritable bowel syndrome)   . Inguinal hernia   . Granulomatous colitis     Past Surgical History  Procedure Laterality Date  . Total abdominal hysterectomy w/ bilateral salpingoophorectomy  2007    left cervix in place  . Inguinal hernia repair      right  . Mastectomy      right    Allergies  Allergen Reactions  . Sulfa Antibiotics Rash    Family history and social history have been reviewed.  Review of Systems:   The remainder of the 10 point ROS is negative except as outlined in the H&P  Physical Exam: General Appearance Well developed, in no distress Eyes  Non icteric  HEENT  Non traumatic, normocephalic  Mouth No lesion, tongue papillated, no cheilosis Neck Supple without adenopathy, thyroid not enlarged, no carotid bruits, no JVD Lungs Clear to auscultation bilaterally COR Normal S1, normal S2, regular rhythm, no murmur, quiet  precordium Abdomen soft nontender with normoactive bowel sounds Rectal long fissure in intergluteal crease which is weeping, it's a red but the there's no puss. Perianal area is normal. Extremities  No pedal edema Skin No lesions Neurological Alert and oriented x 3 Psychological Normal mood and affect  Assessment and Plan:   Problem #1 Perianal fissure extending through the intergluteal crease. We will start her on topical steroids, Mometasone furate 0.1 %, 3 times a day and have her to continue Humira 40 mg every 2 weeks.  Crohn's colitis: She may reduce Asacol HD to 1.6 g twice a day for a month then 800 mg twice a day for month then discontinue. She will need a TB skin test in December 2015. I will see her in 6 to 8 months.    Delfin Edis 03/08/2014

## 2014-03-08 NOTE — Patient Instructions (Addendum)
We have sent the following medications to your pharmacy for you to pick up at your convenience: Mometasone Humira Lomotil  You may taper your Asacol as follows: Asacol 2 tablets twice daily x 1 month, then 1 tablet twice daily x 1 month  CC:Dr Eniola

## 2014-04-13 ENCOUNTER — Other Ambulatory Visit: Payer: Self-pay | Admitting: Family Medicine

## 2014-04-28 ENCOUNTER — Other Ambulatory Visit: Payer: Self-pay | Admitting: Family Medicine

## 2014-07-05 ENCOUNTER — Telehealth (INDEPENDENT_AMBULATORY_CARE_PROVIDER_SITE_OTHER): Payer: PRIVATE HEALTH INSURANCE | Admitting: Internal Medicine

## 2014-07-05 DIAGNOSIS — Z23 Encounter for immunization: Secondary | ICD-10-CM

## 2014-07-11 DIAGNOSIS — Z23 Encounter for immunization: Secondary | ICD-10-CM

## 2014-07-11 NOTE — Telephone Encounter (Signed)
I have placed order in computer for TB test and have placed at front desk for patient pick up.

## 2014-07-26 ENCOUNTER — Ambulatory Visit
Admission: RE | Admit: 2014-07-26 | Discharge: 2014-07-26 | Disposition: A | Discharge status: Home / Self Care | Payer: PRIVATE HEALTH INSURANCE | Source: Ambulatory Visit | Attending: Oncology | Admitting: Oncology

## 2014-07-26 DIAGNOSIS — C50919 Malignant neoplasm of unspecified site of unspecified female breast: Secondary | ICD-10-CM

## 2014-08-16 ENCOUNTER — Telehealth: Payer: Self-pay | Admitting: *Deleted

## 2014-08-16 LAB — TB SKIN TEST
Induration: 0 mm
TB SKIN TEST: NEGATIVE

## 2014-08-16 NOTE — Telephone Encounter (Signed)
Patient has been reminded 2 times to have TB test. She has yet to have this completed.

## 2014-08-16 NOTE — Telephone Encounter (Signed)
-----   Message from Larina Bras, [place] sent at 07/19/2014  1:41 PM EST ----- Pt states that she has not had a chance to come by and pick up TB order. Advised that she must have test by month end for [place] to continue providing Humira. She verbalizes understanding.  ----- Message -----    From: Larina Bras, CMA    Sent: 07/19/2014      To: Larina Bras, CMA  Did pt come on 07/18/14 for tb test?

## 2014-08-16 NOTE — Telephone Encounter (Signed)
Correspondence was received today that patient had negative TB skin test on on 08/03/14 by [facility].

## 2014-10-05 ENCOUNTER — Other Ambulatory Visit: Payer: Self-pay | Admitting: Family Medicine

## 2014-10-17 ENCOUNTER — Telehealth: Payer: Self-pay | Admitting: *Deleted

## 2014-10-17 ENCOUNTER — Telehealth: Payer: Self-pay | Admitting: Internal Medicine

## 2014-10-17 NOTE — Telephone Encounter (Signed)
Called patient's pharmacy [facility] re: Humira refill. Rx for #2 pens with 3 refills given.

## 2014-10-19 NOTE — Telephone Encounter (Signed)
Patient needs Dx code for Humira

## 2014-10-19 NOTE — Telephone Encounter (Signed)
Left a message with Dx code K50.90 for [name] at [place].

## 2014-10-20 ENCOUNTER — Other Ambulatory Visit: Payer: Self-pay | Admitting: *Deleted

## 2014-10-20 DIAGNOSIS — N3941 Urge incontinence: Secondary | ICD-10-CM

## 2014-10-20 MED ORDER — LEVOTHYROXINE SODIUM 88 MCG PO TABS: 88.0000 ug | ORAL_TABLET | Freq: Every day | ORAL | Status: DC

## 2014-10-20 MED ORDER — TOLTERODINE TARTRATE ER 4 MG PO CP24: 4.0000 mg | ORAL_CAPSULE | Freq: Every day | ORAL | Status: AC

## 2014-10-20 MED ORDER — ESCITALOPRAM OXALATE 10 MG PO TABS: 10.0000 mg | ORAL_TABLET | Freq: Every day | ORAL | Status: DC

## 2014-10-20 NOTE — Telephone Encounter (Signed)
Received a call from [place] [facility] [place] stating pt's insurance is requiring her to use a mail order pharmacy.  Please

## 2014-11-04 ENCOUNTER — Ambulatory Visit: Payer: Self-pay | Admitting: Oncology

## 2014-11-11 ENCOUNTER — Telehealth: Payer: Self-pay | Admitting: Oncology

## 2014-11-11 ENCOUNTER — Ambulatory Visit (HOSPITAL_BASED_OUTPATIENT_CLINIC_OR_DEPARTMENT_OTHER): Payer: PRIVATE HEALTH INSURANCE | Admitting: Oncology

## 2014-11-11 VITALS — BP 99/67 | HR 60 | Temp 98.7°F | Resp 18 | Ht 66.0 in | Wt 140.9 lb

## 2014-11-11 DIAGNOSIS — Z853 Personal history of malignant neoplasm of breast: Secondary | ICD-10-CM | POA: Diagnosis not present

## 2014-11-11 DIAGNOSIS — C50911 Malignant neoplasm of unspecified site of right female breast: Secondary | ICD-10-CM

## 2014-11-11 NOTE — Telephone Encounter (Signed)
Pt confirmed MD visit per 04/01 POF, gave pt AVS and Calendar...Marland KitchenMarland KitchenCherylann Banas, also called GI to set up MM pt confirmed D/T.Marland KitchenMarland KitchenMarland Kitchen

## 2014-11-11 NOTE — Progress Notes (Signed)
  [name] OFFICE PROGRESS NOTE   Diagnosis: Breast cancer  INTERVAL HISTORY:   She returns as scheduled. A left mammogram 07/27/2014 was negative. She has noted recent palpitations. Good appetite. She is exercising. The inflammatory bowel disease is controlled with Humira.  Objective:  Vital signs in last 24 hours:  Blood pressure 99/67, pulse 60, temperature 98.7 F (37.1 C), temperature source Oral, resp. rate 18, height 5' 6"  (1.676 m), weight 140 lb 14.4 oz (63.912 kg), SpO2 99 %.    HEENT: Neck without mass Lymphatics: No cervical, supraclavicular, or axillary nodes Resp: Lungs clear bilaterally Cardio: Regular rate and rhythm GI: No hepatomegaly Vascular: No leg edema Breasts: Status post right mastectomy with a TRAM reconstruction. Firm tissue at the medial side of the TRAM. No evidence for chest wall tumor recurrence. Left breast without mass.   Medications: I have reviewed the patient's current medications.  Assessment/Plan:  [name] was diagnosed with right-sided breast cancer in November 1998. She developed a right breast recurrence in December 2003 and underwent a right mastectomy/TRAM reconstruction. She completed adjuvant AC and Taxol chemotherapy. She took tamoxifen August 2004 through July 2009. She began Femara 04/12/2008, discontinued at the end of 2014. She is in clinical remission from breast cancer.  Disposition:  [name] will return for an office visit in 9 months. She will be scheduled for a mammogram in December 2016. She will follow-up with her primary physician for evaluation of the palpitations.  [name], MD  11/11/2014  8:46 AM

## 2014-11-15 ENCOUNTER — Other Ambulatory Visit: Payer: Self-pay | Admitting: Family Medicine

## 2014-11-16 NOTE — Telephone Encounter (Signed)
Please call to inform patient she will need to be seen by me for subsequent refill of her synthroid. Last lab work was over 1 yr ago. I will give one time refill now.

## 2014-11-16 NOTE — Telephone Encounter (Signed)
Pt informed of refill for Synthroid sent to her pharmacy.  Pt advised to schedule an appt with PCP for follow regarding thyroid level.  Pt stated she recently had blood work completed with her job and will fax the results to PCP.  Fax number given.  Will forward to PCP.  Derl Barrow, RN

## 2014-11-21 ENCOUNTER — Other Ambulatory Visit: Payer: Self-pay | Admitting: Internal Medicine

## 2014-11-22 ENCOUNTER — Telehealth: Payer: Self-pay | Admitting: *Deleted

## 2014-11-22 NOTE — Telephone Encounter (Signed)
I am yet to receive her lab from outside lab, I will refill her Synthroid for 90 days once I get it. Please let patient know this.

## 2014-11-22 NOTE — Telephone Encounter (Signed)
Received a message from [facility] requesting a 90 day supply of Levothyroxine 88 mcg.  They did received the 30 day supply along with note stating patient need an appt for lab work.  Pt told them they she had lab work completed at a different office and requested that they fax the results to her PCP.  Will forward to PCP to see if she received any lab results.  Per representative pt can received a 90 day supply at lower cost.  Please advise.  Derl Barrow, RN

## 2014-11-23 ENCOUNTER — Other Ambulatory Visit: Payer: Self-pay | Admitting: Family Medicine

## 2014-11-23 MED ORDER — LEVOTHYROXINE SODIUM 88 MCG PO TABS: 88.0000 ug | ORAL_TABLET | Freq: Every day | ORAL | Status: DC

## 2014-11-23 NOTE — Telephone Encounter (Signed)
Outside lab TSH result received : 1.030  I refilled her Synthroid with 90 days supply.  Please inform patient her refill has been completed.

## 2014-11-23 NOTE — Telephone Encounter (Signed)
Left voice message informing pt that lab work has not been received by PCP.  Fax number left for patient to contact outside lab to fax ASAP.  Once lab results are received at [facility]; a 90 day supply will be sent in to [place].  Derl Barrow, RN

## 2014-12-05 ENCOUNTER — Telehealth: Payer: Self-pay | Admitting: Family Medicine

## 2014-12-05 NOTE — Telephone Encounter (Signed)
[facility]: Needs Rx hr leothyroxine Fax: [phone]

## 2014-12-05 NOTE — Telephone Encounter (Signed)
Refill request was sent to [facility] on 11/23/14.  Pt request that levothyroxine be sent to [place].  Refill given to [facility] per instructions by Dr. Gwendlyn Deutscher: levothyroxine 88 mcg 1 tablet daily before breakfast; #90, 1 refill.  Derl Barrow, RN

## 2015-01-26 ENCOUNTER — Other Ambulatory Visit: Payer: Self-pay | Admitting: Internal Medicine

## 2015-02-22 ENCOUNTER — Other Ambulatory Visit: Payer: Self-pay | Admitting: Internal Medicine

## 2015-03-27 ENCOUNTER — Other Ambulatory Visit: Payer: Self-pay | Admitting: Internal Medicine

## 2015-04-20 ENCOUNTER — Telehealth: Payer: Self-pay | Admitting: *Deleted

## 2015-04-20 NOTE — Telephone Encounter (Signed)
Spoke with patient and scheduled OV with Dr. Silverio Decamp.

## 2015-04-24 ENCOUNTER — Telehealth: Payer: Self-pay | Admitting: Gastroenterology

## 2015-04-24 MED ORDER — ADALIMUMAB 40 MG/0.8ML ~~LOC~~ AJKT: 40.0000 mg | AUTO-INJECTOR | SUBCUTANEOUS | Status: DC

## 2015-04-24 NOTE — Telephone Encounter (Signed)
Yes, that is OK to refill

## 2015-04-24 NOTE — Telephone Encounter (Signed)
Patient is asking for refill on Humira. [employer] pt. She is scheduled with Dr. Silverio Decamp on 06/16/15. Ok to refill? DOD-Jacobs

## 2015-04-24 NOTE — Telephone Encounter (Signed)
Patient notified that Rx has been sent.

## 2015-05-25 ENCOUNTER — Other Ambulatory Visit: Payer: Self-pay | Admitting: Family Medicine

## 2015-05-25 NOTE — Telephone Encounter (Signed)
Patient had not been here for over a year. Please call to let her know we will be giving one time refill. She will need appointment for next refill.

## 2015-05-25 NOTE — Telephone Encounter (Signed)
Left message for patient to call for an appointment.  Has not been to clinic in over a year.  One month refill given on thyroid medication.  Derl Barrow, RN

## 2015-06-16 ENCOUNTER — Encounter: Payer: Self-pay | Admitting: Gastroenterology

## 2015-06-16 ENCOUNTER — Ambulatory Visit (INDEPENDENT_AMBULATORY_CARE_PROVIDER_SITE_OTHER): Payer: PRIVATE HEALTH INSURANCE | Admitting: Gastroenterology

## 2015-06-16 VITALS — BP 100/60 | HR 72 | Ht 66.0 in | Wt 148.6 lb

## 2015-06-16 DIAGNOSIS — K5090 Crohn's disease, unspecified, without complications: Secondary | ICD-10-CM | POA: Diagnosis not present

## 2015-06-16 NOTE — Patient Instructions (Signed)
We have given you your printed orders for your labs we need drawn Please have the place where you have your labs drawn at fax [place] the results to Dr Silverio Decamp at [phone]

## 2015-06-19 ENCOUNTER — Other Ambulatory Visit: Payer: Self-pay | Admitting: Gastroenterology

## 2015-06-19 NOTE — Progress Notes (Signed)
[name]    [id number]    1965-12-18  Primary Care Physician:ENIOLA, Tawanna Solo, MD  Referring Physician: Kinnie Feil, MD [address] [place], [place] [address]  Chief complaint:  Colitis  HPI:  49 year old white female with Crohn's colitis diagnosed on a colonoscopy in December 2013 which initially responded to a prednisone taper but relapsed and declined further steroid treatment. Patient also reports that her symptoms diarrhea worsened with increase in Asacol dose and improved after tapering off Asacol. She was also started on a Humira induction dose around the same time and is on maintenance therapy of 40 mg SQ every 2 weeks since January 2015. Denies any nausea, vomiting, abdominal pain, diarrhea, constipation, melena or bright red blood per rectum    Outpatient Encounter Prescriptions as of 06/16/2015  Medication Sig  . Adalimumab (HUMIRA PEN) 40 MG/0.8ML PNKT Inject 40 mg into the skin every 14 (fourteen) days.  Marland Kitchen b complex vitamins capsule Take 1 capsule by mouth daily.  . Calcium Carb-Cholecalciferol (CALCIUM + D3 PO) Take 1 tablet by mouth daily.  . cetirizine (ZYRTEC) 10 MG tablet Take 10 mg by mouth daily. As needed.  . clonazePAM (KLONOPIN) 1 MG tablet Take 0.5 mg by mouth at bedtime as needed.   . diphenoxylate-atropine (LOMOTIL) 2.5-0.025 MG per tablet Take 1 tablet by mouth 4 (four) times daily as needed for diarrhea or loose stools.  Marland Kitchen escitalopram (LEXAPRO) 10 MG tablet Take 1 tablet (10 mg total) by mouth daily.  . ferrous sulfate (SLOW FE) 160 (50 FE) MG TBCR Take 1 tablet (160 mg total) by mouth daily.  . fluticasone (FLONASE) 50 MCG/ACT nasal spray Place 2 sprays into the nose daily. In each nostril as needed  . ibuprofen (ADVIL,MOTRIN) 200 MG tablet Take 200 mg by mouth. Takes 2-3 tablets four times a day as needed  . levothyroxine (SYNTHROID, LEVOTHROID) 88 MCG tablet TAKE 1 TABLET BY MOUTH DAILY BEFORE BREAKFAST.  . Multiple Vitamin  (MULTIVITAMIN) tablet Take 1 tablet by mouth daily.  Marland Kitchen PAIN RELIEVER EXTRA STRENGTH 500 MG tablet TAKE 2 TABLETS BY MOUTH FOUR TIMES DAILY AS NEEDED  . tolterodine (DETROL LA) 4 MG 24 hr capsule Take 1 capsule (4 mg total) by mouth daily. As needed.  . Vaginal Lubricant (REPLENS) GEL USE AS DIRECTED  . VITAMIN E PO Take by mouth. 5000 mg daily  . [DISCONTINUED] ibuprofen (ADVIL,MOTRIN) 200 MG tablet TAKE 1 TO 2 TABLETS BY MOUTH FOUR TIMES DAILY AS NEEDED (Patient not taking: Reported on 11/11/2014)   No facility-administered encounter medications on file as of 06/16/2015.    Allergies as of 06/16/2015 - Review Complete 06/16/2015  Allergen Reaction Noted  . Sulfa antibiotics Rash 09/16/2011    Past Medical History  Diagnosis Date  . HX: breast cancer 11/98 and 12/03    recurrance  . Hypothyroidism   . History of MRI of cervical spine 4/03    mild DJD  . History of bone density study     no osteopenia  . Anxiety   . Asthma   . Chronic headaches   . IBS (irritable bowel syndrome)   . Inguinal hernia   . Granulomatous colitis [facility])     Past Surgical History  Procedure Laterality Date  . Total abdominal hysterectomy w/ bilateral salpingoophorectomy  2007    left cervix in place  . Inguinal hernia repair Right   . Mastectomy Right     Family History  Problem Relation Age of Onset  . Hypertension Mother   . Skin cancer Father   . Diabetes Neg Hx   . Heart disease Neg Hx   . Stroke Neg Hx   . Breast cancer Paternal Aunt   . Colon polyps Mother   . Colon polyps Brother   . Irritable bowel syndrome Mother   . Psoriasis Brother   . Dermatomyositis Brother     Social History   Social History  . Marital Status: Divorced    Spouse Name: N/A  . Number of Children: 0  . Years of Education: N/A   Occupational History  . managed care    Social History Main Topics  . Smoking status: Never Smoker   . Smokeless tobacco: Never Used  . Alcohol Use: Yes     Comment: occas    . Drug Use: No  . Sexual Activity: Not on file   Other Topics Concern  . Not on file   Social History Narrative      Review of systems: Review of Systems  Constitutional: Negative for fever and chills.  HENT: Negative.   Eyes: Negative for blurred vision.  Respiratory: Negative for cough, shortness of breath and wheezing.   Cardiovascular: Negative for chest pain and palpitations.  Gastrointestinal: as per HPI Genitourinary: Negative for dysuria, urgency, frequency and hematuria.  Musculoskeletal: Negative for myalgias, back pain and joint pain.  Skin: Negative for itching and rash.  Neurological: Negative for dizziness, tremors, focal weakness, seizures and loss of consciousness.  Endo/Heme/Allergies: Negative for environmental allergies.  Psychiatric/Behavioral: Negative for depression, suicidal ideas and hallucinations.  All other systems reviewed and are negative.   Physical Exam: Filed Vitals:   06/16/15 1516  BP: 100/60  Pulse: 72   Gen:      No acute distress HEENT:  EOMI, sclera anicteric Neck:     No masses; no thyromegaly Lungs:    Clear to auscultation bilaterally; normal respiratory effort CV:         Regular rate and rhythm; no murmurs Abd:      + bowel sounds; soft, non-tender; no palpable masses, no distension Ext:    No edema; adequate peripheral perfusion Skin:      Warm and dry; no rash Neuro: alert and oriented x 3 Psych: normal mood and affect Rectal: Normal sphincter tone, no hemorrhoids or fissures  Data Reviewed: Colonoscopy 2013  1. Surgical [P], random, right colon, bx - GRANULOMATOUS COLITIS. - SEE COMMENT. 2. Surgical [P], random rectum and sigmoid 0-30 cm, bx - GRANULOMATOUS COLITIS. - SEE COMMENT. Microscopic Comment 2. The histological features are similar in both biopsies and show rare to occasional minute to small intraepithelial granulomas present in the mid to upper epithelium. The differential diagnosis includes Crohn's  disease, infectious etiology and systemic granulomatous disease process such as sarcoidosis. Dr. Donato Heinz has also reviewed this case and essentially agrees with the above interpretation. Additional correlation with endoscopic and clinical history and findings is suggested.    Assessment and Plan/Recommendations:  2 yr F with pan colitis with granulomas diagnosed in 2013 with exacerbation of symptoms on Asacol, currently in remission on Humira here for follow up visit Patient feels her symptoms were related to Asacol and would like to come off Humira; but she was symptomatic prior to starting Asacol and had evidence of granulomatous colitis concerning for crohn's disease and her symptoms were likely exacerbated by Asacol Discussed with patient that she will need to continue Humira for maintenance based on  the information we have so far.  Will check CBC, BMP, LFT, ESR, ferritin, B12/folate and Quantiferon TB gold Will try to review the slides with pathologist again and may also have to consider repeat colonoscopy to biopsy to see if has any features of chronic colitis in addition to granulomas Return in 6 months  K. Denzil Magnuson , MD [phone] Mon-Fri 8a-5p [phone] after 5p, weekends, holidays

## 2015-06-23 ENCOUNTER — Other Ambulatory Visit: Payer: Self-pay | Admitting: Family Medicine

## 2015-07-04 ENCOUNTER — Other Ambulatory Visit: Payer: Self-pay | Admitting: Family Medicine

## 2015-07-04 ENCOUNTER — Telehealth: Payer: Self-pay | Admitting: *Deleted

## 2015-07-04 ENCOUNTER — Ambulatory Visit (INDEPENDENT_AMBULATORY_CARE_PROVIDER_SITE_OTHER): Payer: PRIVATE HEALTH INSURANCE | Admitting: Family Medicine

## 2015-07-04 ENCOUNTER — Encounter: Payer: Self-pay | Admitting: Family Medicine

## 2015-07-04 VITALS — BP 107/70 | HR 73 | Temp 98.3°F | Ht 66.0 in | Wt 149.1 lb

## 2015-07-04 DIAGNOSIS — Z114 Encounter for screening for human immunodeficiency virus [HIV]: Secondary | ICD-10-CM

## 2015-07-04 DIAGNOSIS — IMO0001 Reserved for inherently not codable concepts without codable children: Secondary | ICD-10-CM

## 2015-07-04 DIAGNOSIS — Z0000 Encounter for general adult medical examination without abnormal findings: Secondary | ICD-10-CM | POA: Diagnosis not present

## 2015-07-04 DIAGNOSIS — M542 Cervicalgia: Secondary | ICD-10-CM | POA: Diagnosis not present

## 2015-07-04 DIAGNOSIS — T753XXA Motion sickness, initial encounter: Secondary | ICD-10-CM

## 2015-07-04 DIAGNOSIS — L989 Disorder of the skin and subcutaneous tissue, unspecified: Secondary | ICD-10-CM | POA: Diagnosis not present

## 2015-07-04 MED ORDER — MECLIZINE HCL 25 MG PO TABS: 25.0000 mg | ORAL_TABLET | Freq: Every day | ORAL | Status: DC | PRN

## 2015-07-04 MED ORDER — LEVOTHYROXINE SODIUM 88 MCG PO TABS: 88.0000 ug | ORAL_TABLET | Freq: Every day | ORAL | Status: DC

## 2015-07-04 MED ORDER — DESONIDE 0.05 % EX CREA: TOPICAL_CREAM | Freq: Two times a day (BID) | CUTANEOUS | Status: DC

## 2015-07-04 MED ORDER — MECLIZINE HCL 25 MG PO TABS: 25.0000 mg | ORAL_TABLET | Freq: Two times a day (BID) | ORAL | Status: DC | PRN

## 2015-07-04 NOTE — Telephone Encounter (Signed)
I tried calling in medication but no response. I have placed another script up front for faxing. Thanks.

## 2015-07-04 NOTE — Patient Instructions (Signed)
It was nice seeing you today. You are up to date with your cancer screening test. We will obtain HIV test today. I have prescribed steroid scream for your skin lesion. Continue Ibuprofen as needed for neck pain. F/U as needed.
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
## 2015-07-04 NOTE — Telephone Encounter (Signed)
Jacqlyn Larsen, from [facility] called stating that the provider did not sign the Rx for meclizine.  Please give her a call at [phone] ext [phone] or fax it back with sig to [phone].  Derl Barrow, RN

## 2015-07-05 ENCOUNTER — Encounter: Payer: Self-pay | Admitting: *Deleted

## 2015-07-05 LAB — HIV ANTIBODY (ROUTINE TESTING W REFLEX): HIV: NONREACTIVE

## 2015-07-19 ENCOUNTER — Other Ambulatory Visit: Payer: Self-pay | Admitting: Family Medicine

## 2015-07-28 ENCOUNTER — Ambulatory Visit
Admission: RE | Admit: 2015-07-28 | Discharge: 2015-07-28 | Disposition: A | Discharge status: Home / Self Care | Payer: PRIVATE HEALTH INSURANCE | Source: Ambulatory Visit | Attending: Oncology | Admitting: Oncology

## 2015-07-28 DIAGNOSIS — C50911 Malignant neoplasm of unspecified site of right female breast: Secondary | ICD-10-CM

## 2015-07-31 ENCOUNTER — Other Ambulatory Visit: Payer: Self-pay | Admitting: Family Medicine

## 2015-08-11 ENCOUNTER — Ambulatory Visit (HOSPITAL_BASED_OUTPATIENT_CLINIC_OR_DEPARTMENT_OTHER): Payer: PRIVATE HEALTH INSURANCE | Admitting: Oncology

## 2015-08-11 ENCOUNTER — Telehealth: Payer: Self-pay | Admitting: Oncology

## 2015-08-11 VITALS — BP 96/63 | HR 60 | Temp 98.6°F | Resp 18 | Wt 150.2 lb

## 2015-08-11 DIAGNOSIS — C50911 Malignant neoplasm of unspecified site of right female breast: Secondary | ICD-10-CM

## 2015-08-11 DIAGNOSIS — Z853 Personal history of malignant neoplasm of breast: Secondary | ICD-10-CM | POA: Diagnosis not present

## 2015-08-11 NOTE — Telephone Encounter (Signed)
Scheduled patient per pof, AVS report printed.

## 2015-08-11 NOTE — Progress Notes (Signed)
  [facility] OFFICE PROGRESS NOTE   Diagnosis: Breast cancer  INTERVAL HISTORY:   [name] returns as scheduled. A left mammogram on 07/31/2015 was negative. She is now followed by Dr. Silverio Decamp for the inflammatory bowel disease. She continues Femara. She complains of malaise . She has intermittent soreness at the medial side of the right TRAM. No palpable change. She is exercising and has a good appetite. She can use to have palpitations. She reports being referred to cardiology-[facility].  Objective:  Vital signs in last 24 hours:  Blood pressure 96/63, pulse 60, temperature 98.6 F (37 C), temperature source Oral, resp. rate 18, weight 150 lb 3.2 oz (68.13 kg), SpO2 100 %.    HEENT: Neck without mass Lymphatics: No cervical, supraclavicular, or right axillary nodes. Pea-sized soft mobile left axillary node versus prominent fatty tissue. Resp: Lungs clear bilaterally Cardio: Regular rate and rhythm GI: No hepatomegaly Vascular: No leg edema Breasts: Status was right mastectomy with a TRAM reconstruction. No evidence for chest wall tumor recurrence. Mild tenderness at the right costal sternal junction. Left breast without mass.      Medications: I have reviewed the patient's current medications.  Assessment/Plan: [name] was diagnosed with right-sided breast cancer in November 1998. She developed a right breast recurrence in December 2003 and underwent a right mastectomy/TRAM reconstruction. She completed adjuvant AC and Taxol chemotherapy. She took tamoxifen August 2004 through July 2009. She began Femara 04/12/2008, discontinued at the end of 2014. She is in clinical remission from breast cancer.    Disposition:  [name] will return for an office visit in 9 months.  [name], MD  08/11/2015  9:12 AM

## 2015-10-02 ENCOUNTER — Other Ambulatory Visit: Payer: Self-pay

## 2015-10-02 MED ORDER — ADALIMUMAB 40 MG/0.8ML ~~LOC~~ AJKT: 40.0000 mg | AUTO-INJECTOR | SUBCUTANEOUS | Status: DC

## 2015-10-20 ENCOUNTER — Encounter: Payer: Self-pay | Admitting: Gastroenterology

## 2016-01-26 ENCOUNTER — Other Ambulatory Visit: Payer: Self-pay | Admitting: Gastroenterology

## 2016-02-26 ENCOUNTER — Other Ambulatory Visit: Payer: Self-pay | Admitting: Gastroenterology

## 2016-02-26 NOTE — Telephone Encounter (Signed)
Patient needs to have TB screening (Quantiferon)

## 2016-03-12 ENCOUNTER — Ambulatory Visit (INDEPENDENT_AMBULATORY_CARE_PROVIDER_SITE_OTHER): Payer: PRIVATE HEALTH INSURANCE | Admitting: Gastroenterology

## 2016-03-12 ENCOUNTER — Encounter: Payer: Self-pay | Admitting: Gastroenterology

## 2016-03-12 VITALS — BP 92/60 | HR 62 | Ht 66.0 in | Wt 152.0 lb

## 2016-03-12 DIAGNOSIS — R002 Palpitations: Secondary | ICD-10-CM

## 2016-03-12 DIAGNOSIS — R5383 Other fatigue: Secondary | ICD-10-CM | POA: Diagnosis not present

## 2016-03-12 DIAGNOSIS — K5090 Crohn's disease, unspecified, without complications: Secondary | ICD-10-CM

## 2016-03-12 NOTE — Patient Instructions (Signed)
We have given you your lab orders for you to have drawn in or before October. Please fax att to Dr Silverio Decamp at [phone]

## 2016-03-13 ENCOUNTER — Telehealth: Payer: Self-pay | Admitting: *Deleted

## 2016-03-13 DIAGNOSIS — R002 Palpitations: Secondary | ICD-10-CM

## 2016-03-13 NOTE — Telephone Encounter (Signed)
Called cardiology, they said they see the referral in the work Q to schedule appointment for patient. This will be scheduled once Dr Silverio Decamp finishes her office note. They will contact the patient directly to schedule

## 2016-03-14 NOTE — Progress Notes (Signed)
Wynette Jersey    [id number]    01-Mar-1966  Primary Care Physician:ENIOLA, Tawanna Solo, MD  Referring Physician: Kinnie Feil, MD [address] [place], [place] [address]  Chief complaint:  Crohn's disease  HPI: 50 year old white female with Crohn's colitis diagnosed on a colonoscopy in December 2013 which initially responded to a prednisone taper but relapsed and declined further steroid treatment and subsequently started on Asacol and Humira. Patient reports that diarrhea worsened with increase in Asacol dose and improved after tapering off Asacol. She was started on a Humira induction dose around the same time and is on maintenance therapy of 40 mg SQ every 2 weeks since January 2015. She is skipping some doses of Humira as it is hurting more when she does the subcutaneous injections. She missed last 2 doses. Denies any rash, erythema or induration at the site of injection.   Denies any nausea, vomiting, abdominal pain, diarrhea, constipation, melena or bright red blood per rectum. She is having formed 2-3 BM a day on average. On ROS positive for palpitation, tried to see cardiologist in [place] but she left without being seen as she wasn't happy with the office staff. Thyroid level wnl on replacement per patient. She has also been getting extremely fatigued and has daytime somnolence. Last sleep study about 10 yrs ago was borderline   Outpatient Encounter Prescriptions as of 03/12/2016  Medication Sig  . Adalimumab (HUMIRA PEN) 40 MG/0.8ML PNKT Inject 40 mg into the skin every 14 (fourteen) days.  Marland Kitchen aspirin 81 MG tablet Take 81 mg by mouth daily.  Marland Kitchen b complex vitamins capsule Take 1 capsule by mouth daily.  . Biotin 5000 MCG CAPS Take by mouth.  . Calcium Carb-Cholecalciferol (CALCIUM + D3 PO) Take 1 tablet by mouth daily.  Marland Kitchen desonide (DESOWEN) 0.05 % cream APPLY TOPICALLY TWICE DAILY  . escitalopram (LEXAPRO) 10 MG tablet TAKE ONE TABLET BY MOUTH DAILY  . ferrous  sulfate (SLOW FE) 160 (50 FE) MG TBCR Take 1 tablet (160 mg total) by mouth daily.  . fluticasone (FLONASE) 50 MCG/ACT nasal spray Place 2 sprays into the nose daily. In each nostril as needed  . HUMIRA PEN 40 MG/0.8ML PNKT INJECT ONE PEN (40 MG) SUBCUTANEOUSLY EVERY 14 DAYS  . ibuprofen (ADVIL,MOTRIN) 200 MG tablet Take 200 mg by mouth. Takes 2-3 tablets four times a day as needed  . levothyroxine (SYNTHROID, LEVOTHROID) 88 MCG tablet Take 1 tablet (88 mcg total) by mouth daily before breakfast.  . Multiple Vitamin (MULTIVITAMIN) tablet Take 1 tablet by mouth daily.  . Vaginal Lubricant (REPLENS) GEL USE AS DIRECTED  . VITAMIN E PO Take by mouth. 5000 mg daily  . [DISCONTINUED] meclizine (ANTIVERT) 25 MG tablet Take 1 tablet (25 mg total) by mouth daily as needed for dizziness (Do not use when driving. AVOID DRIVING WHEN ON MEDICATION. May use more than 8 hrs before driving.).   No facility-administered encounter medications on file as of 03/12/2016.     Allergies as of 03/12/2016 - Review Complete 03/12/2016  Allergen Reaction Noted  . Sulfa antibiotics Rash 09/16/2011    Past Medical History:  Diagnosis Date  . Anxiety   . Asthma   . Chronic headaches   . Granulomatous colitis ([facility])   . History of bone density study    no osteopenia  . History of MRI of cervical spine 4/03   mild DJD  . HX: breast cancer 11/98 and 12/03  recurrance  . Hypothyroidism   . IBS (irritable bowel syndrome)   . Inguinal hernia     Past Surgical History:  Procedure Laterality Date  . INGUINAL HERNIA REPAIR Right   . MASTECTOMY Right   . TOTAL ABDOMINAL HYSTERECTOMY W/ BILATERAL SALPINGOOPHORECTOMY  2007   left cervix in place    Family History  Problem Relation Age of Onset  . Hypertension Mother   . Colon polyps Mother   . Irritable bowel syndrome Mother   . Skin cancer Father   . Breast cancer Paternal Aunt   . Colon polyps Brother   . Psoriasis Brother   . Dermatomyositis Brother   .  Diabetes Neg Hx   . Heart disease Neg Hx   . Stroke Neg Hx     Social History   Social History  . Marital status: Divorced    Spouse name: N/A  . Number of children: 0  . Years of education: N/A   Occupational History  . managed care [employer]   Social History Main Topics  . Smoking status: Never Smoker  . Smokeless tobacco: Never Used  . Alcohol use Yes     Comment: occas  . Drug use: No  . Sexual activity: Not on file   Other Topics Concern  . Not on file   Social History Narrative  . No narrative on file      Review of systems: Review of Systems  Constitutional: Negative for fever and chills. Positive for fatigue HENT: Negative.   Eyes: Negative for blurred vision.  Respiratory: Negative for cough, shortness of breath and wheezing.   Cardiovascular: Negative for chest pain and positive for palpitations.  Gastrointestinal: as per HPI Genitourinary: Negative for dysuria, urgency, frequency and hematuria.  Musculoskeletal: Negative for myalgias, back pain and joint pain.  Skin: Negative for itching and rash.  Neurological: Negative for dizziness, tremors, focal weakness, seizures and loss of consciousness.  Endo/Heme/Allergies: Positive for environmental allergies.  Psychiatric/Behavioral: Negative for depression, suicidal ideas and hallucinations.  All other systems reviewed and are negative.   Physical Exam: Vitals:   03/12/16 1450  BP: 92/60  Pulse: 62   Gen:      No acute distress HEENT:  EOMI, sclera anicteric Neck:     No masses; no thyromegaly Lungs:    Clear to auscultation bilaterally; normal respiratory effort CV:         Regular rate and rhythm; no murmurs Abd:      + bowel sounds; soft, non-tender; no palpable masses, no distension Ext:    No edema; adequate peripheral perfusion Skin:      Warm and dry; no rash Neuro: alert and oriented x 3 Psych: normal mood and affect  Data Reviewed:  Reviewed chart in epic   Assessment and  Plan/Recommendations:  13 yr F with pan colitis with granulomas diagnosed in 2013 with exacerbation of symptoms on Asacol, currently in remission on Humira here for follow up visit Discussed with patient that she will need to continue Humira and has to be compliant with the regimen and avoid skipping any doses Check Humira drug level and Ab  Will check CBC, BMP, LFT, ESR,  and Quantiferon TB gold (Last Quantiferon in Nov 2016 was normal) Will refer to cardiology for evaluation of palpitations She will benefit from repeat sleep study, patient will discuss with PMD Return in 6 months   K. Denzil Magnuson , MD [phone] Mon-Fri 8a-5p [phone] after 5p, weekends, holidays  CC: Andrena Mews  T, MD

## 2016-03-28 ENCOUNTER — Telehealth: Payer: Self-pay

## 2016-03-28 ENCOUNTER — Other Ambulatory Visit: Payer: Self-pay | Admitting: Family Medicine

## 2016-03-28 ENCOUNTER — Encounter: Payer: Self-pay | Admitting: Cardiovascular Disease

## 2016-03-28 DIAGNOSIS — E039 Hypothyroidism, unspecified: Secondary | ICD-10-CM

## 2016-03-28 NOTE — Progress Notes (Signed)
Patient need lab appointment for TSH prior to her next refill.

## 2016-03-28 NOTE — Telephone Encounter (Signed)
Pt is coming in tomm afternoon for her TSH apt.

## 2016-03-28 NOTE — Telephone Encounter (Signed)
Thanks

## 2016-03-28 NOTE — Telephone Encounter (Signed)
1 month supply given. Need TSH test prior to subsequent refill.

## 2016-03-29 ENCOUNTER — Other Ambulatory Visit: Payer: Self-pay

## 2016-03-29 ENCOUNTER — Other Ambulatory Visit: Payer: PRIVATE HEALTH INSURANCE

## 2016-03-29 DIAGNOSIS — E039 Hypothyroidism, unspecified: Secondary | ICD-10-CM

## 2016-03-30 LAB — TSH: TSH: 0.58 m[IU]/L

## 2016-04-01 ENCOUNTER — Encounter: Payer: Self-pay | Admitting: *Deleted

## 2016-04-01 ENCOUNTER — Other Ambulatory Visit: Payer: Self-pay | Admitting: Family Medicine

## 2016-04-01 MED ORDER — LEVOTHYROXINE SODIUM 88 MCG PO TABS
ORAL_TABLET | ORAL | 1 refills | Status: DC
Start: 2016-04-01 — End: 2016-07-08

## 2016-04-01 NOTE — Telephone Encounter (Signed)
Please call to advise patient. Her TSH looks ok and I have given 90 days refills. I hope to see her soon.  Let her know that she can complete the previous 30 days supply before picking up her 90 days supply meds.   Again remind her that she is to take no more than 1 tablet of synthroid 88 mcg per day.  Thanks.

## 2016-04-01 NOTE — Telephone Encounter (Signed)
mychart message sent to patient. Genese Quebedeaux,CMA

## 2016-04-09 ENCOUNTER — Encounter: Payer: Self-pay | Admitting: Cardiovascular Disease

## 2016-04-24 ENCOUNTER — Ambulatory Visit: Payer: PRIVATE HEALTH INSURANCE | Admitting: Cardiovascular Disease

## 2016-04-25 ENCOUNTER — Other Ambulatory Visit: Payer: Self-pay | Admitting: Gastroenterology

## 2016-04-26 ENCOUNTER — Ambulatory Visit (INDEPENDENT_AMBULATORY_CARE_PROVIDER_SITE_OTHER): Payer: PRIVATE HEALTH INSURANCE | Admitting: Cardiovascular Disease

## 2016-04-26 ENCOUNTER — Encounter: Payer: Self-pay | Admitting: Cardiovascular Disease

## 2016-04-26 VITALS — BP 112/68 | HR 66 | Ht 66.0 in | Wt 146.4 lb

## 2016-04-26 DIAGNOSIS — K5010 Crohn's disease of large intestine without complications: Secondary | ICD-10-CM

## 2016-04-26 DIAGNOSIS — C50911 Malignant neoplasm of unspecified site of right female breast: Secondary | ICD-10-CM | POA: Diagnosis not present

## 2016-04-26 DIAGNOSIS — R002 Palpitations: Secondary | ICD-10-CM | POA: Diagnosis not present

## 2016-04-26 LAB — CBC WITH DIFFERENTIAL/PLATELET
BASOS ABS: 42 {cells}/uL (ref 0–200)
Basophils Relative: 1 %
EOS ABS: 210 {cells}/uL (ref 15–500)
Eosinophils Relative: 5 %
HCT: 37.3 % (ref 35.0–45.0)
HEMOGLOBIN: 12.1 g/dL (ref 11.7–15.5)
LYMPHS ABS: 1554 {cells}/uL (ref 850–3900)
LYMPHS PCT: 37 %
MCH: 27.9 pg (ref 27.0–33.0)
MCHC: 32.4 g/dL (ref 32.0–36.0)
MCV: 86.1 fL (ref 80.0–100.0)
MONO ABS: 420 {cells}/uL (ref 200–950)
MPV: 10.9 fL (ref 7.5–12.5)
Monocytes Relative: 10 %
NEUTROS PCT: 47 %
Neutro Abs: 1974 cells/uL (ref 1500–7800)
Platelets: 186 10*3/uL (ref 140–400)
RBC: 4.33 MIL/uL (ref 3.80–5.10)
RDW: 12.8 % (ref 11.0–15.0)
WBC: 4.2 10*3/uL (ref 3.8–10.8)

## 2016-04-26 LAB — BASIC METABOLIC PANEL
BUN: 14 mg/dL (ref 7–25)
CHLORIDE: 104 mmol/L (ref 98–110)
CO2: 31 mmol/L (ref 20–31)
CREATININE: 0.77 mg/dL (ref 0.50–1.05)
Calcium: 9.7 mg/dL (ref 8.6–10.4)
Glucose, Bld: 84 mg/dL (ref 65–99)
Potassium: 4.1 mmol/L (ref 3.5–5.3)
SODIUM: 141 mmol/L (ref 135–146)

## 2016-04-26 NOTE — Progress Notes (Signed)
Cardiology Office Note   Date:  04/26/2016   ID:  [name], DOB August 16, 1965, MRN [id number]  PCP:  Andrena Mews, MD  Cardiologist:   Mertie Moores, MD   Chief Complaint  Patient presents with  . Palpitations   Problem List  1. Breast Cancer - right breast, XRT, chemo has had twice ( 1998, 2003)  2. Crohns Disease -  Dx Dec. 2013.   On Humira since 2015.    History of Present Illness: [name] is a 50 y.o. female who presents for palpitations  She has had palpitations for the past year. 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  Worse when she lies on her left side. Like a pounding in her chest. Is not associated with CP or dyspnea  Has them every day   Very active,   Runs 7 miles a day  Does not have them / does not notice them when she is running .  ?  No association with caffiene She drinks lots of caffiene  Has had some dizziness with standing  Also has vertigo   Occasionally / frequently  skips breakfast.  Does not sleep well Is sleepy all the time.       Past Medical History:  Diagnosis Date  . Anxiety   . Asthma   . Chronic headaches   . Granulomatous colitis ([place])   . History of bone density study    no osteopenia  . History of MRI of cervical spine 4/03   mild DJD  . HX: breast cancer 11/98 and 12/03   recurrance  . Hypothyroidism   . IBS (irritable bowel syndrome)   . Inguinal hernia     Past Surgical History:  Procedure Laterality Date  . INGUINAL HERNIA REPAIR Right   . MASTECTOMY Right   . TOTAL ABDOMINAL HYSTERECTOMY W/ BILATERAL SALPINGOOPHORECTOMY  2007   left cervix in place     Current Outpatient Prescriptions  Medication Sig Dispense Refill  . aspirin 81 MG tablet Take 81 mg by mouth daily.    Marland Kitchen b complex vitamins capsule Take 1 capsule by mouth daily.    . Biotin 5000 MCG CAPS Take by mouth.     . Calcium Carb-Cholecalciferol (CALCIUM + D3 PO) Take 1 tablet by mouth daily.    Marland Kitchen desonide (DESOWEN) 0.05 % cream APPLY TOPICALLY TWICE DAILY 2 g 0  . escitalopram (LEXAPRO) 10 MG tablet TAKE ONE TABLET BY MOUTH DAILY 90 tablet 1  . ferrous sulfate (SLOW FE) 160 (50 FE) MG TBCR Take 1 tablet (160 mg total) by mouth daily. 30 each 0  . fluticasone (FLONASE) 50 MCG/ACT nasal spray Place 2 sprays into the nose daily. In each nostril as needed    . HUMIRA PEN 40 MG/0.8ML PNKT INJECT ONE PEN (40 MG) SUBCUTANEOUSLY EVERY 14 DAYS 2 each 1  . ibuprofen (ADVIL,MOTRIN) 200 MG tablet Take 200 mg by mouth. Takes 2-3 tablets four times a day as needed    . levothyroxine (SYNTHROID, LEVOTHROID) 88 MCG tablet TAKE 1 TABLET (88 MCG) BY MOUTH DAILY BEFORE BREAKFAST. 90 tablet 1  . Multiple Vitamin (MULTIVITAMIN) tablet Take 1 tablet by mouth daily.    . Vaginal Lubricant (REPLENS) GEL USE AS DIRECTED 35 g 0  . VITAMIN E PO Take by mouth. 5000 mg daily     No current facility-administered medications for this visit.  Allergies:   Sulfa antibiotics    Social History:  The patient  reports that she has never smoked. She has never used smokeless tobacco. She reports that she drinks alcohol. She reports that she does not use drugs.   Family History:  The patient's family history includes Breast cancer in her paternal aunt; Colon polyps in her brother and mother; Dermatomyositis in her brother; Hypertension in her mother; Irritable bowel syndrome in her mother; Psoriasis in her brother; Skin cancer in her father.    ROS:  Please see the history of present illness.  She has bigger appetite for the past year    Review of Systems: Constitutional:  denies fever, chills, diaphoresis, appetite change and fatigue.  HEENT: denies photophobia, eye pain, redness, hearing loss, ear pain, congestion, sore throat, rhinorrhea, sneezing, neck pain, neck stiffness and tinnitus.  Respiratory: denies SOB, DOE, cough,  chest tightness, and wheezing.  Cardiovascular: admits to  palpitations    Gastrointestinal: denies nausea, vomiting, abdominal pain, diarrhea, constipation, blood in stool.  Genitourinary: denies dysuria, urgency, frequency, hematuria, flank pain and difficulty urinating.  Musculoskeletal: denies  myalgias, back pain, joint swelling, arthralgias and gait problem.   Skin: denies pallor, rash and wound.  Neurological: denies dizziness, seizures, syncope, weakness, light-headedness, numbness and headaches.   Hematological: denies adenopathy, easy bruising, personal or family bleeding history.  Psychiatric/ Behavioral: denies suicidal ideation, mood changes, confusion, nervousness, sleep disturbance and agitation.       All other systems are reviewed and negative.    PHYSICAL EXAM: VS:  BP 112/68   Pulse 66   Ht 5' 6"  (1.676 m)   Wt 146 lb 6.4 oz (66.4 kg)   BMI 23.63 kg/m  , BMI Body mass index is 23.63 kg/m. GEN: Well nourished, well developed, in no acute distress  HEENT: normal  Neck: no JVD, carotid bruits, or masses Cardiac: RRR; no murmurs, rubs, or gallops,no edema  Respiratory:  clear to auscultation bilaterally, normal work of breathing GI: soft, nontender, nondistended, + BS MS: no deformity or atrophy  Skin: warm and dry, no rash Neuro:  Strength and sensation are intact Psych: normal   EKG:  EKG is ordered today. The ekg ordered today demonstrates  NSR at 64.   Occasional PVCs    Recent Labs: 03/29/2016: TSH 0.58    Lipid Panel    Component Value Date/Time   CHOL 164 05/06/2012   TRIG 92 05/06/2012   HDL 56 05/06/2012   CHOLHDL 2.9 11/06/2010 1009   VLDL 14 11/06/2010 1009   LDLCALC 89 05/06/2012      Wt Readings from Last 3 Encounters:  04/26/16 146 lb 6.4 oz (66.4 kg)  03/12/16 152 lb (68.9 kg)  08/11/15 150 lb 3.2 oz (68.1 kg)      Other studies Reviewed: Additional studies/ records that were reviewed today include: . Review of the above  records demonstrates:    ASSESSMENT AND PLAN:  1.  Palpitations Likely are PVCs Have recommended V-8 juice Increase her protein  May have sleep apnea, had a sleep study years ago. May need a repeat study Needs to sleep better  We spent 50 minutes discussing her palpitations and related issues.    2. Crohn's disease  Suggest that she get the Humira syringes instead of the auto-inject pens.   Better tolerated. We discussed her Crohn's at great length  3. Breast cancer  - s/p surgical flap reconstruction  4. Hypothyroidism:    Current medicines are reviewed at length  with the patient today.  The patient does not have concerns regarding medicines.  Labs/ tests ordered today include:  No orders of the defined types were placed in this encounter.    Disposition:   FU with me in 3 months      Mertie Moores, MD  04/26/2016 9:47 AM    [address], [place], [place]  [address] Phone: [phone]; Fax: [phone]   [facility]  [address] [place], [place]  [address] [phone]   Fax [phone]

## 2016-04-26 NOTE — Patient Instructions (Signed)
Medication Instructions:  Your physician recommends that you continue on your current medications as directed. Please refer to the Current Medication list given to you today.   Labwork: TODAY - CBC, basic metabolic panel   Testing/Procedures: Your physician has recommended that you wear an event monitor. Event monitors are medical devices that record the heart's electrical activity. Doctors most often [place] these monitors to diagnose arrhythmias. Arrhythmias are problems with the speed or rhythm of the heartbeat. The monitor is a small, portable device. You can wear one while you do your normal daily activities. This is usually used to diagnose what is causing palpitations/syncope (passing out).   Follow-Up: Your physician recommends that you schedule a follow-up appointment in: 3 months with Dr. Acie Fredrickson.    If you need a refill on your cardiac medications before your next appointment, please call your pharmacy.   Thank you for choosing [facility]! Christen Bame, RN [phone]

## 2016-04-29 ENCOUNTER — Encounter: Payer: Self-pay | Admitting: Family Medicine

## 2016-04-29 ENCOUNTER — Other Ambulatory Visit: Payer: Self-pay | Admitting: Family Medicine

## 2016-04-29 ENCOUNTER — Encounter: Payer: Self-pay | Admitting: Cardiovascular Disease

## 2016-04-29 ENCOUNTER — Telehealth: Payer: Self-pay

## 2016-04-29 DIAGNOSIS — E039 Hypothyroidism, unspecified: Secondary | ICD-10-CM

## 2016-04-29 NOTE — Telephone Encounter (Signed)
I called pt to inform her that you would like for her to come in for a TSh check. Pt stated she did not want to come back up here and you just checked her levels 2 weeks ago. Per chart looks like levels were checked on 8/18, which is what you stated and why you wanted her to come back in. Pt would like for you to call her to discuss. Pt states you missed something in her hx.

## 2016-04-29 NOTE — Telephone Encounter (Signed)
I spoke with patient. I discussed with her she recently got TSH done 4 wks ago and it is appropriate to recheck now prior to cutting back on her dose since TSH is still within range.  She verbalized understanding and agreed with plan.  She did not mention that anything was missed in her hx.

## 2016-05-07 ENCOUNTER — Ambulatory Visit (INDEPENDENT_AMBULATORY_CARE_PROVIDER_SITE_OTHER): Payer: PRIVATE HEALTH INSURANCE

## 2016-05-07 DIAGNOSIS — R002 Palpitations: Secondary | ICD-10-CM

## 2016-05-09 ENCOUNTER — Ambulatory Visit (HOSPITAL_BASED_OUTPATIENT_CLINIC_OR_DEPARTMENT_OTHER): Payer: PRIVATE HEALTH INSURANCE | Admitting: Oncology

## 2016-05-09 VITALS — BP 96/59 | HR 57 | Temp 98.0°F | Resp 18 | Ht 66.0 in | Wt 147.8 lb

## 2016-05-09 DIAGNOSIS — C50411 Malignant neoplasm of upper-outer quadrant of right female breast: Secondary | ICD-10-CM

## 2016-05-09 DIAGNOSIS — Z853 Personal history of malignant neoplasm of breast: Secondary | ICD-10-CM

## 2016-05-09 NOTE — Progress Notes (Signed)
  [place] OFFICE PROGRESS NOTE   Diagnosis: Breast cancer [name] returns as scheduled. She is currently wearing a heart monitor for evaluation of palpitations. She complains of somnolence and malaise. Good appetite. She is exercising. She is working. The Crohn's disease is under control with Humira.  A left mammogram was negative on 07/28/2015.  INTERVAL HISTORY:   [name] returns as scheduled. She is currently wearing a heart monitor for evaluation of palpitations. She complains of somnolence and malaise. Good appetite. She is exercising. She is working.    Objective:  Vital signs in last 24 hours:  Blood pressure (!) 96/59, pulse (!) 57, temperature 98 F (36.7 C), temperature source Oral, resp. rate 18, height 5' 6"  (1.676 m), weight 147 lb 12.8 oz (67 kg), SpO2 100 %.    HEENT: Neck without mass Lymphatics: No cervical, supraclavicular, or axillary nodes Resp: Lungs clear bilaterally Cardio: Regular rate and rhythm GI: No hepatomegaly Vascular: No leg edema Breast: Status post right mastectomy with a TRAM reconstruction. No evidence for chest wall tumor recurrence. Firm tissue at the medial aspect of the TRAM. Left breast without mass. Skin: Flat less than 1 cm purplish mole at the right upper paraspinal region   Medications: I have reviewed the patient's current medications.  Assessment/Plan: [name] was diagnosed with right-sided breast cancer in November 1998. She developed a right breast recurrence in December 2003 and underwent a right mastectomy/TRAM reconstruction. She completed adjuvant AC and Taxol chemotherapy. She took tamoxifen August 2004 through July 2009. She began Femara 04/12/2008, discontinued at the end of 2014. She is in clinical remission from breast cancer.    Disposition:  She will be scheduled for a left mammogram in December 2017. She will return for an office visit in 9 months. I suspect the mole at the upper back is  benign. I asked her to have her primary physician evaluate this.  [name], MD  05/09/2016  3:18 PM

## 2016-05-17 ENCOUNTER — Telehealth: Payer: Self-pay | Admitting: Oncology

## 2016-05-17 NOTE — Telephone Encounter (Signed)
Left message re June 2018 f/u. Patient to call for mammo.

## 2016-07-01 ENCOUNTER — Other Ambulatory Visit: Payer: Self-pay | Admitting: Gastroenterology

## 2016-07-08 ENCOUNTER — Other Ambulatory Visit: Payer: Self-pay | Admitting: Family Medicine

## 2016-07-10 NOTE — Progress Notes (Signed)
   [facility] Phone: [phone]   Date of Visit: 07/11/2016   HPI:  Patient presents today for a well woman exam.   Concerns today: none Periods: hysterectomy with bilateral salpingooophorectomy  Contraception: n/a Pelvic symptoms: none  Sexual activity: no STD Screening: declined Pap smear status: completed 10/2013 with negative cytology and negative HPV Exercise: daily- running or walking 6-8 miles  Diet: well balanced; fried foods maybe once a week, no fast foods, sweets- daily (dark chocolate)  Smoking: never smoker  Alcohol: only on the weekends. Will have 3-4 drinks on a typical day. CAGE questions negative.  Drugs: denies Advance directives: already has information but has not completed packet Mood: PHQ2 negative  Dentist: twice a year  HA: - couple times a week (nagging HA), painful HA 1-2 a week - notices that it usually comes when she does not sleep properly or sitting at desk for long period of time and look at TV - usually take ibuprofen - has been having symptoms for years; no changing - has degenerative disc in neck - no nausea or vomiting - used to see a migraine specialist; she does not get severe HA anymore - usually on the back of head sometimes in the front, unable to describe it  - no blurred vision  - lasts a few hours   ROS: See HPI  [place]:  History updated in chart   PMH: Crohn's Disease (on Humira) Multinodular Goiter; Hypothyroidism Osteopenia  Urge Incontinence  Anxiety with Depression  Insomnia  History of palpitations  History of Breast Cancer (R in 1998 with recurrence in 07/2002 s/p R mastectomy/TRAM reconstruction) now in remission  PHYSICAL EXAM: BP 111/69   Pulse 68   Temp 98.6 F (37 C) (Oral)   Ht 5' 6"  (1.676 m)   Wt 147 lb (66.7 kg)   SpO2 98%   BMI 23.73 kg/m  Gen: NAD, pleasant, cooperative HEENT: NCAT, PERRL, no palpable thyromegaly or anterior cervical lymphadenopathy, nares and nasal  turbinates are normal.  Heart: RRR, no murmurs Lungs: CTAB, NWOB Abdomen: soft, nontender to palpation, normal bowel sounds Neuro: grossly nonfocal, speech normal  ASSESSMENT/PLAN:  # Health maintenance:  -STD screening: declines -pap smear: completed 10/2013 with negative cytology and negative HPV -mammogram: followed by oncology for history of breast cancer (in remission); due to mammogram in 07/2016 -lipid screening: reports she has it done at her work. Asked patient to provide copy of lab work  -DEXA: patient reports her oncologist usually orders  -immunizations: reports she obtained from work. Asked patient to provide copy of lab work   -advance directives: patient already has packet  Refilled Lexapro and Flonase.   Smiley Houseman, MD PGY [place]

## 2016-07-11 ENCOUNTER — Ambulatory Visit (INDEPENDENT_AMBULATORY_CARE_PROVIDER_SITE_OTHER): Payer: PRIVATE HEALTH INSURANCE | Admitting: Internal Medicine

## 2016-07-11 ENCOUNTER — Encounter: Payer: Self-pay | Admitting: Internal Medicine

## 2016-07-11 VITALS — BP 111/69 | HR 68 | Temp 98.6°F | Ht 66.0 in | Wt 147.0 lb

## 2016-07-11 DIAGNOSIS — Z0000 Encounter for general adult medical examination without abnormal findings: Secondary | ICD-10-CM

## 2016-07-11 MED ORDER — FLUTICASONE PROPIONATE 50 MCG/ACT NA SUSP: 2.0000 | Freq: Every day | NASAL | 0 refills | Status: DC

## 2016-07-11 MED ORDER — ESCITALOPRAM OXALATE 10 MG PO TABS: 10.0000 mg | ORAL_TABLET | Freq: Every day | ORAL | 0 refills | Status: DC

## 2016-07-11 NOTE — Patient Instructions (Addendum)
Thank you for coming in. Please see if you can provide [place] with a copy of your lab work done at your work. I refilled your Lexapro and Flonase

## 2016-07-15 ENCOUNTER — Encounter: Payer: Self-pay | Admitting: Cardiovascular Disease

## 2016-07-15 ENCOUNTER — Ambulatory Visit (INDEPENDENT_AMBULATORY_CARE_PROVIDER_SITE_OTHER): Payer: PRIVATE HEALTH INSURANCE | Admitting: Cardiovascular Disease

## 2016-07-15 VITALS — BP 114/68 | HR 68 | Ht 66.0 in | Wt 149.0 lb

## 2016-07-15 DIAGNOSIS — I493 Ventricular premature depolarization: Secondary | ICD-10-CM

## 2016-07-15 NOTE — Patient Instructions (Signed)
Medication Instructions:  Your physician recommends that you continue on your current medications as directed. Please refer to the Current Medication list given to you today.   Labwork: None Ordered   Testing/Procedures: None Ordered   Follow-Up: Your physician recommends that you schedule a follow-up appointment in: as needed with Dr. Acie Fredrickson.    If you need a refill on your cardiac medications before your next appointment, please call your pharmacy.   Thank you for choosing [facility]! Christen Bame, RN [phone]

## 2016-07-15 NOTE — Progress Notes (Signed)
Cardiology Office Note   Date:  07/15/2016   ID:  [name], DOB [birth year], MRN [id number]  PCP:  Andrena Mews, MD  Cardiologist:   Mertie Moores, MD   Chief Complaint  Patient presents with  . Follow-up    s/p SVT ablation .    Problem List  1. Breast Cancer - right breast, XRT, chemo has had twice ( 1998, 2003)  2. Crohns Disease -  Dx Dec. 2013.   On Humira since 2015. 3.  PVCs.      Milisa Kimbell is a 50 y.o. female who presents for palpitations  She has had palpitations for the past year. 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  Worse when she lies on her left side. Like a pounding in her chest. Is not associated with CP or dyspnea  Has them every day   Very active,   Runs 7 miles a day  Does not have them / does not notice them when she is running .  ?  No association with caffiene She drinks lots of caffiene  Has had some dizziness with standing  Also has vertigo   Occasionally / frequently  skips breakfast.  Does not sleep well Is sleepy all the time.  Dec. 4, 2017:  Ilyana is seen for follow up of palpitations.    Was found to have PVCs Feels better  Tolerating her Humira shots better.      Past Medical History:  Diagnosis Date  . Anxiety   . Asthma   . Chronic headaches   . Granulomatous colitis ([place])   . History of bone density study    no osteopenia  . History of MRI of cervical spine 4/03   mild DJD  . HX: breast cancer 11/98 and 12/03   recurrance  . Hypothyroidism   . IBS (irritable bowel syndrome)   . Inguinal hernia     Past Surgical History:  Procedure Laterality Date  . INGUINAL HERNIA REPAIR Right   . MASTECTOMY Right   . TOTAL ABDOMINAL HYSTERECTOMY W/ BILATERAL SALPINGOOPHORECTOMY  2007   left cervix in place     Current Outpatient Prescriptions  Medication Sig Dispense Refill  . aspirin  81 MG tablet Take 81 mg by mouth daily.    Marland Kitchen b complex vitamins capsule Take 1 capsule by mouth daily.    . Biotin 5000 MCG CAPS Take by mouth.    . Calcium Carb-Cholecalciferol (CALCIUM + D3 PO) Take 1 tablet by mouth daily.    Marland Kitchen desonide (DESOWEN) 0.05 % cream APPLY TOPICALLY TWICE DAILY 2 g 0  . escitalopram (LEXAPRO) 10 MG tablet Take 1 tablet (10 mg total) by mouth daily. 90 tablet 0  . ferrous sulfate (SLOW FE) 160 (50 FE) MG TBCR Take 1 tablet (160 mg total) by mouth daily. 30 each 0  . fluticasone (FLONASE) 50 MCG/ACT nasal spray Place 2 sprays into both nostrils daily. In each nostril as needed 16 g 0  . HUMIRA PEN 40 MG/0.8ML PNKT INJECT ONE PEN (40 MG) SUBCUTANEOUSLY EVERY 14 DAYS 2 each 1  . ibuprofen (ADVIL,MOTRIN) 200 MG tablet Take 200 mg by mouth. Takes 2-3 tablets four times a day as needed    . levothyroxine (SYNTHROID, LEVOTHROID) 88 MCG tablet TAKE 1 TABLET (88 MCG) BY MOUTH DAILY BEFORE BREAKFAST. 90 tablet 2  . Multiple Vitamin (MULTIVITAMIN) tablet Take  1 tablet by mouth daily.    Marland Kitchen tolterodine (DETROL LA) 4 MG 24 hr capsule Take 4 mg by mouth daily as needed.    . Vaginal Lubricant (REPLENS) GEL USE AS DIRECTED 35 g 0  . VITAMIN E PO Take by mouth. 5000 mg daily     No current facility-administered medications for this visit.     Allergies:   Sulfa antibiotics    Social History:  The patient  reports that she has never smoked. She has never used smokeless tobacco. She reports that she drinks alcohol. She reports that she does not use drugs.   Family History:  The patient's family history includes Breast cancer in her paternal aunt; Colon polyps in her brother and mother; Dermatomyositis in her brother; Hypertension in her mother; Irritable bowel syndrome in her mother; Psoriasis in her brother; Skin cancer in her father.    ROS:  Please see the history of present illness.  She has bigger appetite for the past year    Review of Systems: Constitutional:  denies  fever, chills, diaphoresis, appetite change and fatigue.  HEENT: denies photophobia, eye pain, redness, hearing loss, ear pain, congestion, sore throat, rhinorrhea, sneezing, neck pain, neck stiffness and tinnitus.  Respiratory: denies SOB, DOE, cough, chest tightness, and wheezing.  Cardiovascular: admits to  palpitations    Gastrointestinal: denies nausea, vomiting, abdominal pain, diarrhea, constipation, blood in stool.  Genitourinary: denies dysuria, urgency, frequency, hematuria, flank pain and difficulty urinating.  Musculoskeletal: denies  myalgias, back pain, joint swelling, arthralgias and gait problem.   Skin: denies pallor, rash and wound.  Neurological: denies dizziness, seizures, syncope, weakness, light-headedness, numbness and headaches.   Hematological: denies adenopathy, easy bruising, personal or family bleeding history.  Psychiatric/ Behavioral: denies suicidal ideation, mood changes, confusion, nervousness, sleep disturbance and agitation.       All other systems are reviewed and negative.    PHYSICAL EXAM: VS:  BP 114/68   Pulse 68   Ht 5' 6"  (1.676 m)   Wt 149 lb (67.6 kg)   BMI 24.05 kg/m  , BMI Body mass index is 24.05 kg/m. GEN: Well nourished, well developed, in no acute distress  HEENT: normal  Neck: no JVD, carotid bruits, or masses Cardiac: RRR; no murmurs, rubs, or gallops,no edema  Respiratory:  clear to auscultation bilaterally, normal work of breathing GI: soft, nontender, nondistended, + BS MS: no deformity or atrophy  Skin: warm and dry, no rash Neuro:  Strength and sensation are intact Psych: normal   EKG:  EKG is ordered today. The ekg ordered today demonstrates  NSR at 64.   Occasional PVCs    Recent Labs: 03/29/2016: TSH 0.58 04/26/2016: BUN 14; Creat 0.77; Hemoglobin 12.1; Platelets 186; Potassium 4.1; Sodium 141    Lipid Panel    Component Value Date/Time   CHOL 164 05/06/2012   TRIG 92 05/06/2012   HDL 56 05/06/2012    CHOLHDL 2.9 11/06/2010 1009   VLDL 14 11/06/2010 1009   LDLCALC 89 05/06/2012      Wt Readings from Last 3 Encounters:  07/15/16 149 lb (67.6 kg)  07/11/16 147 lb (66.7 kg)  05/09/16 147 lb 12.8 oz (67 kg)      Other studies Reviewed: Additional studies/ records that were reviewed today include: . Review of the above records demonstrates:    ASSESSMENT AND PLAN:  1.  Palpitations Was found to have PVCs on 30 day monitor  Does not like V-8 .   Getting other  sources of potassium  Suggested No-Salt salt substitute.    2. Crohn's disease   seems to be stable .   3. Breast cancer  - s/p surgical flap reconstruction  4. Hypothyroidism:    Current medicines are reviewed at length with the patient today.  The patient does not have concerns regarding medicines.  Labs/ tests ordered today include:  No orders of the defined types were placed in this encounter.   Disposition:   FU with me as needed.     Mertie Moores, MD  07/15/2016 9:50 AM    [facility] [place], [place], [place]  [address] Phone: [phone]; Fax: [phone]

## 2016-07-22 ENCOUNTER — Encounter: Payer: Self-pay | Admitting: Internal Medicine

## 2016-07-24 ENCOUNTER — Other Ambulatory Visit: Payer: Self-pay | Admitting: Oncology

## 2016-07-24 ENCOUNTER — Telehealth: Payer: Self-pay

## 2016-07-24 DIAGNOSIS — Z853 Personal history of malignant neoplasm of breast: Secondary | ICD-10-CM

## 2016-07-24 NOTE — Telephone Encounter (Signed)
Per chart review, this medication was last prescribed in 2016 by a different provider. It would be poor care for me to refill without discussing the need for this medication. I believe the patient needs to follow up with her PCP to discuss.

## 2016-07-24 NOTE — Telephone Encounter (Signed)
-----   Message from Kinnie Feil, MD sent at 07/23/2016  8:23 AM EST ----- Hello,  Please contact patient to schedule follow up appointment with me for incontinence and hx of blood in her urine.  Let her know that I don't believe I have ever filled her Detrol in the past. I have not discussed incontinence with her in her previous visits with me.  I need to assess her for this before I can start filling this medication for her. Thanks.

## 2016-07-24 NOTE — Telephone Encounter (Signed)
Pt saw Dr. Dallas Schimke for a physical and stated she mentioned her detrol refill to her "several" times. Pt is wondering if you can refill this for her. Pt has yet to see Dr. Gwendlyn Deutscher for this issue and does not want to come back in given she was just here for a physical last week. Please advise.

## 2016-07-26 ENCOUNTER — Ambulatory Visit: Payer: Self-pay | Admitting: Cardiovascular Disease

## 2016-07-26 NOTE — Telephone Encounter (Signed)
Pt and Dr. Gwendlyn Deutscher are discussing her refill request in a mychart encounter.

## 2016-07-30 ENCOUNTER — Ambulatory Visit
Admission: RE | Admit: 2016-07-30 | Discharge: 2016-07-30 | Disposition: A | Discharge status: Home / Self Care | Payer: PRIVATE HEALTH INSURANCE | Source: Ambulatory Visit | Attending: Oncology | Admitting: Oncology

## 2016-07-30 DIAGNOSIS — Z853 Personal history of malignant neoplasm of breast: Secondary | ICD-10-CM

## 2016-07-30 MED ORDER — TOLTERODINE TARTRATE ER 4 MG PO CP24: 4.0000 mg | ORAL_CAPSULE | Freq: Every day | ORAL | 2 refills | Status: DC | PRN

## 2016-07-30 NOTE — Addendum Note (Signed)
Addended by: Andrena Mews T on: 07/30/2016 02:28 PM   Modules accepted: Orders

## 2016-09-04 ENCOUNTER — Telehealth: Payer: Self-pay | Admitting: Gastroenterology

## 2016-09-04 ENCOUNTER — Other Ambulatory Visit: Payer: Self-pay | Admitting: *Deleted

## 2016-09-04 ENCOUNTER — Other Ambulatory Visit: Payer: Self-pay

## 2016-09-04 MED ORDER — ADALIMUMAB 40 MG/0.8ML ~~LOC~~ AJKT: 1.0000 "pen " | AUTO-INJECTOR | SUBCUTANEOUS | 2 refills | Status: DC

## 2016-09-04 MED ORDER — FLUTICASONE PROPIONATE 50 MCG/ACT NA SUSP: 2.0000 | Freq: Every day | NASAL | 2 refills | Status: AC

## 2016-09-27 ENCOUNTER — Telehealth: Payer: Self-pay | Admitting: Family Medicine

## 2016-09-27 NOTE — Telephone Encounter (Signed)
Spoke with patient and she is unable to go to the urgent care because she lives alone and doesn't have anyone to drive her.  She doesn't feel like she could safely drive herself there either.  Advised patient to see if anyone could take her this weekend to be evaluated.  She denies being exposed to someone with confirmed flu. Patient voiced understanding. Sharad Vaneaton,CMA

## 2016-09-27 NOTE — Telephone Encounter (Signed)
Will forward to MD to advise.  We are full this afternoon and there are no openings. Jazmin Hartsell,CMA

## 2016-09-27 NOTE — Telephone Encounter (Signed)
Patient made aware. Jazmin Hartsell,CMA

## 2016-09-27 NOTE — Telephone Encounter (Signed)
Thanks. Please advise her she can call 911 if feeling really sick. Thanks for following up.

## 2016-09-27 NOTE — Telephone Encounter (Signed)
Got sick overnight.  Temp 99.8, vomiting, headache, hot/cold chills.  Stomach pain.  Thinks she has the flu.  Can Tamiflu be called in for her?  Kristopher Oppenheim Friendly

## 2016-09-27 NOTE — Telephone Encounter (Signed)
It is unclear to me if this is actually a flu. It might be common viral illness. She does not have a fever, no cough. Any hx of contact with person with confirmed flu?  Please advise her to go to the urgent care today for evaluation regardless since there is no opening in our clinic. I will prefer she get's evaluated to make appropriate assessment and treatment.  Please call patient and advise her to go to the urgent care today. Thanks.

## 2016-11-19 NOTE — Telephone Encounter (Signed)
Done

## 2016-11-28 ENCOUNTER — Other Ambulatory Visit: Payer: Self-pay

## 2016-11-28 ENCOUNTER — Telehealth: Payer: Self-pay

## 2016-11-28 ENCOUNTER — Other Ambulatory Visit (INDEPENDENT_AMBULATORY_CARE_PROVIDER_SITE_OTHER): Payer: 59

## 2016-11-28 DIAGNOSIS — K50118 Crohn's disease of large intestine with other complication: Secondary | ICD-10-CM | POA: Diagnosis not present

## 2016-11-28 LAB — CBC WITH DIFFERENTIAL/PLATELET
BASOS ABS: 0 10*3/uL (ref 0.0–0.1)
Basophils Relative: 0.8 % (ref 0.0–3.0)
EOS ABS: 0.2 10*3/uL (ref 0.0–0.7)
Eosinophils Relative: 2.7 % (ref 0.0–5.0)
HCT: 41.2 % (ref 36.0–46.0)
Hemoglobin: 13.6 g/dL (ref 12.0–15.0)
LYMPHS ABS: 2 10*3/uL (ref 0.7–4.0)
LYMPHS PCT: 32.7 % (ref 12.0–46.0)
MCHC: 33 g/dL (ref 30.0–36.0)
MCV: 85 fl (ref 78.0–100.0)
MONO ABS: 0.5 10*3/uL (ref 0.1–1.0)
Monocytes Relative: 8.2 % (ref 3.0–12.0)
NEUTROS ABS: 3.4 10*3/uL (ref 1.4–7.7)
Neutrophils Relative %: 55.6 % (ref 43.0–77.0)
PLATELETS: 192 10*3/uL (ref 150.0–400.0)
RBC: 4.85 Mil/uL (ref 3.87–5.11)
RDW: 13 % (ref 11.5–15.5)
WBC: 6.1 10*3/uL (ref 4.0–10.5)

## 2016-11-28 LAB — BASIC METABOLIC PANEL
BUN: 20 mg/dL (ref 6–23)
CHLORIDE: 101 meq/L (ref 96–112)
CO2: 32 mEq/L (ref 19–32)
Calcium: 9.7 mg/dL (ref 8.4–10.5)
Creatinine, Ser: 0.7 mg/dL (ref 0.40–1.20)
GFR: 93.59 mL/min (ref >60.00)
Glucose, Bld: 119 mg/dL — ABNORMAL HIGH (ref 70–99)
POTASSIUM: 4 meq/L (ref 3.5–5.1)
SODIUM: 140 meq/L (ref 135–145)

## 2016-11-28 LAB — HEPATIC FUNCTION PANEL
ALT: 17 U/L (ref 0–35)
AST: 20 U/L (ref 0–37)
Albumin: 4.9 g/dL (ref 3.5–5.2)
Alkaline Phosphatase: 63 U/L (ref 39–117)
BILIRUBIN DIRECT: 0 mg/dL (ref 0.0–0.3)
BILIRUBIN TOTAL: 0.3 mg/dL (ref 0.2–1.2)
Total Protein: 7.4 g/dL (ref 6.0–8.3)

## 2016-11-28 LAB — SEDIMENTATION RATE: Sed Rate: 15 mm/hr (ref 0–30)

## 2016-11-28 NOTE — Telephone Encounter (Signed)
I have received the PA form from [employer] [facility] which I will complete and submit. The patient was last seen in August 2017. She did not have the labs drawn that I can find. She does not have a return appointment scheduled. Do you want me to send her a letter?

## 2016-11-28 NOTE — Telephone Encounter (Signed)
Patient calls back. States she is a day late getting her injection of Humira. PA form completed and faxed. Patient made aware she is 16 months past due TB screening. She also is past due her other labs. She states she will come in today to have this taken care of.

## 2016-11-28 NOTE — Telephone Encounter (Signed)
Left the patient a message requesting a call back. Needs to come in for labs. Past due TB screening.

## 2016-11-30 LAB — QUANTIFERON TB GOLD ASSAY (BLOOD)
Interferon Gamma Release Assay: NEGATIVE
Mitogen-Nil: 9.83 IU/mL
Quantiferon Nil Value: 0.05 IU/mL
Quantiferon Tb Ag Minus Nil Value: 0.11 IU/mL

## 2016-12-02 ENCOUNTER — Other Ambulatory Visit: Payer: Self-pay

## 2016-12-02 NOTE — Telephone Encounter (Signed)
I called the patient to advise her of her lab results. Asked her what pharmacy she orders her Humira from and if she had heard from them about her Humira order. She states she has not heard from them, has not called them but hopes to soon. She uses Psychologist, counselling. I added this pharmacy to her list . Called the [facility]. Medication not released because PA is pending. Spoke with the [facility]. PA has been sent for medical review. Per my request she has sent the review department a message that the patient's treatment has been delayed and also this is a continuation of therapy. Patient has been on Humira since April of 2015.

## 2016-12-10 ENCOUNTER — Other Ambulatory Visit: Payer: Self-pay | Admitting: Internal Medicine

## 2016-12-11 ENCOUNTER — Other Ambulatory Visit: Payer: Self-pay

## 2016-12-12 ENCOUNTER — Telehealth: Payer: Self-pay | Admitting: Gastroenterology

## 2016-12-13 NOTE — Telephone Encounter (Signed)
She is doing okay. Her Humira is in appeal. Insurance wants step therapy. Records support she has had step therapy except for trial and fail of Remicade. Remission on Humira since 2013.  I have 2 sample pens coming, but they will not be here before 12/17/16. [name] tells me she hasn't had a dose of Humira since 11/12/16. It this going to be a problem? Her follow up appointment is already scheduled for 01/08/17.

## 2016-12-13 NOTE — Telephone Encounter (Signed)
Patient states that she received an email that her [name] was denied again and patient would like to know what to do.

## 2016-12-16 NOTE — Telephone Encounter (Signed)
Yes prefer not to have her skip any Humira Injection. She may have a flare of Crohn's disease and can also potentially causing Ab to Humira.

## 2016-12-17 ENCOUNTER — Other Ambulatory Visit: Payer: Self-pay

## 2016-12-17 DIAGNOSIS — K50119 Crohn's disease of large intestine with unspecified complications: Secondary | ICD-10-CM

## 2016-12-17 NOTE — Telephone Encounter (Signed)
The insurance has approved Humeria through the appeal. The patient received her prescription on 12/14/16 and she has injected.

## 2017-01-08 ENCOUNTER — Encounter: Payer: Self-pay | Admitting: Gastroenterology

## 2017-01-08 ENCOUNTER — Ambulatory Visit (INDEPENDENT_AMBULATORY_CARE_PROVIDER_SITE_OTHER): Payer: 59 | Admitting: Gastroenterology

## 2017-01-08 VITALS — BP 100/70 | HR 72 | Ht 66.0 in | Wt 149.8 lb

## 2017-01-08 DIAGNOSIS — R109 Unspecified abdominal pain: Secondary | ICD-10-CM

## 2017-01-08 DIAGNOSIS — R5383 Other fatigue: Secondary | ICD-10-CM

## 2017-01-08 DIAGNOSIS — K50119 Crohn's disease of large intestine with unspecified complications: Secondary | ICD-10-CM | POA: Diagnosis not present

## 2017-01-08 DIAGNOSIS — R140 Abdominal distension (gaseous): Secondary | ICD-10-CM | POA: Diagnosis not present

## 2017-01-08 DIAGNOSIS — R040 Epistaxis: Secondary | ICD-10-CM

## 2017-01-08 DIAGNOSIS — G479 Sleep disorder, unspecified: Secondary | ICD-10-CM

## 2017-01-08 MED ORDER — NA SULFATE-K SULFATE-MG SULF 17.5-3.13-1.6 GM/177ML PO SOLN: ORAL | 0 refills | Status: DC

## 2017-01-08 NOTE — Patient Instructions (Addendum)
You have been scheduled for a colonoscopy. Please follow written instructions given to you at your visit today.  Please pick up your prep supplies at the pharmacy within the next 1-3 days. If you use inhalers (even only as needed), please bring them with you on the day of your procedure. Your physician has requested that you go to www.startemmi.com and enter the access code given to you at your visit today. This web site gives a general overview about your procedure. However, you should still follow specific instructions given to you by our office regarding your preparation for the procedure.  Use IBGard 1 capsule three times a day as needed  We will schedule you for your Dexa Scan and contact you with that appointment  ( 01/22/2017 at 9:30am [facility])  Make sure you follow up with ENT doctor  Follow up with your primary care doctor to discuss sleep study  Keep your dermatology appointment as scheduled

## 2017-01-08 NOTE — Progress Notes (Signed)
[name]    [id number]    05/16/1966  Primary Care Physician:Eniola, Phill Myron, MD  Referring Physician: Kinnie Feil, MD [address] [place], [place] [address]  Chief complaint:  Crohn's colitis  HPI:  51 year old female with history of Crohn's colitis diagnosed in December 2013 on Humira 40 mg every 2 weeks here for follow-up visit. Patient switched her job from [employer] to [employer] and with change in insurance required to get approval for Humira and consequences did not have the medication for about 6 weeks. She reports having intermittent episodes of diarrhea with mucus usually 1 or 2 bowel movements. Denies any blood per rectum. She alternates between constipation and diarrhea. Also has excessive bloating and abdominal cramps. Patient complained of skin breakdown inside her nares with foul-smelling discharge, was worse when she was off Humira and she feels the lining has healed since she started back on Humira. Patient feels tired throughout the day and also has excessive daytime somnolence. She was diagnosed with mild sleep apnea about 15 years ago but wasn't recommended CPAP. She doesn't have a PMD here.  Complained of a rash on the back, has an appointment with dermatologist next month.  Denies any nausea, vomiting, abdominal pain, melena or bright red blood per rectum   Outpatient Encounter Prescriptions as of 01/08/2017  Medication Sig  . Adalimumab (HUMIRA PEN) 40 MG/0.8ML PNKT Inject 1 pen into the skin every 14 (fourteen) days. Please schedule an appointment  . aspirin 81 MG tablet Take 81 mg by mouth as needed.   Marland Kitchen b complex vitamins capsule Take 1 capsule by mouth daily.  . Biotin 5000 MCG CAPS Take 1 capsule by mouth daily.   . Calcium Carb-Cholecalciferol (CALCIUM + D3 PO) Take 1 tablet by mouth daily.  Marland Kitchen desonide (DESOWEN) 0.05 % cream APPLY TOPICALLY TWICE DAILY  . escitalopram (LEXAPRO) 10 MG tablet TAKE ONE TABLET BY MOUTH  DAILY (Patient taking differently: TAKE ONE TABLET BY MOUTH DAILY at bedtime)  . ferrous sulfate (SLOW FE) 160 (50 FE) MG TBCR Take 1 tablet (160 mg total) by mouth daily.  . fluticasone (FLONASE) 50 MCG/ACT nasal spray Place 2 sprays into both nostrils daily. In each nostril as needed  . ibuprofen (ADVIL,MOTRIN) 200 MG tablet Take 200 mg by mouth. Takes 2-3 tablets four times a day as needed  . levothyroxine (SYNTHROID, LEVOTHROID) 88 MCG tablet TAKE 1 TABLET (88 MCG) BY MOUTH DAILY BEFORE BREAKFAST.  . Multiple Vitamin (MULTIVITAMIN) tablet Take 1 tablet by mouth daily.  Marland Kitchen tolterodine (DETROL LA) 4 MG 24 hr capsule Take 1 capsule (4 mg total) by mouth daily as needed.  . Vaginal Lubricant (REPLENS) GEL USE AS DIRECTED  . VITAMIN E PO Take by mouth. 5000 mg daily   No facility-administered encounter medications on file as of 01/08/2017.     Allergies as of 01/08/2017 - Review Complete 01/08/2017  Allergen Reaction Noted  . Sulfa antibiotics Rash 09/16/2011    Past Medical History:  Diagnosis Date  . Anxiety   . Asthma   . Chronic headaches   . Granulomatous colitis ([place])   . History of bone density study    no osteopenia  . History of MRI of cervical spine 4/03   mild DJD  . HX: breast cancer 11/98 and 12/03   recurrance  . Hypothyroidism   . IBS (irritable bowel syndrome)   . Inguinal hernia     Past  Surgical History:  Procedure Laterality Date  . INGUINAL HERNIA REPAIR Right   . MASTECTOMY Right   . TOTAL ABDOMINAL HYSTERECTOMY W/ BILATERAL SALPINGOOPHORECTOMY  2007   left cervix in place    Family History  Problem Relation Age of Onset  . Hypertension Mother   . Colon polyps Mother   . Irritable bowel syndrome Mother   . Skin cancer Father   . Breast cancer Paternal Aunt   . Colon polyps Brother   . Psoriasis Brother   . Dermatomyositis Brother   . Diabetes Neg Hx   . Heart disease Neg Hx   . Stroke Neg Hx     Social History   Social History  . Marital  status: Divorced    Spouse name: N/A  . Number of children: 0  . Years of education: N/A   Occupational History  . managed care [employer]   Social History Main Topics  . Smoking status: Never Smoker  . Smokeless tobacco: Never Used  . Alcohol use Yes     Comment: occas  . Drug use: No  . Sexual activity: Not on file   Other Topics Concern  . Not on file   Social History Narrative  . No narrative on file      Review of systems: Review of Systems  Constitutional: Negative for fever and chills.  positive for lack of energy HENT: Positive for sinus problem, nosebleed, sores inside her nares Eyes: Negative for blurred vision.  Respiratory: Negative for cough, shortness of breath and wheezing.   Cardiovascular: Negative for chest pain and positive for palpitations.  history of PVCs Gastrointestinal: as per HPI Genitourinary: Negative for dysuria, urgency, frequency and hematuria.  Musculoskeletal: Positive for myalgias, back pain and joint pain.  Skin: Negative for itching and rash.  Neurological: Negative for tremors, focal weakness, seizures and loss of consciousness.  Endo/Heme/Allergies: Positive for seasonal allergies.  positive for easy bleeding and bruising Psychiatric/Behavioral: Negative for depression, suicidal ideas and hallucinations.  All other systems reviewed and are negative.   Physical Exam: Vitals:   01/08/17 1452  BP: 100/70  Pulse: 72   Body mass index is 24.18 kg/m. Gen:      No acute distress HEENT:  EOMI, sclera anicteric, no visible mucosal breakdown or erythema in the anterior nares Neck:     No masses; no thyromegaly Lungs:    Clear to auscultation bilaterally; normal respiratory effort CV:         Regular rate and rhythm; no murmurs Abd:      + bowel sounds; soft, non-tender; no palpable masses, no distension Ext:    No edema; adequate peripheral perfusion Skin:      Warm and dry; no rash Neuro: alert and oriented x 3 Psych: normal mood  and affect  Data Reviewed:  Reviewed labs, radiology imaging, old records and pertinent past GI work up   Assessment and Plan/Recommendations:  51 year old female with Crohn's colitis diagnosed in 2013 in clinical remission since 2015 on Humira 40 mg every 2 weeks here for follow-up visit  Intermittent excessive bloating and cramps: Advised patient to decrease fiber intake and avoid raw vegetables IB Gard 1 capsule 3 times a day as needed  Crohn's colitis: Inflammatory markers within normal limits Clinically appears to be in remission Due for surveillance colonoscopy, we'll schedule it The risks and benefits as well as alternatives of endoscopic procedure(s) have been discussed and reviewed. All questions answered. The patient agrees to proceed.  DEXA scan,  last in 2014, due for repeat bone density scan Patient has dermatology follow-up scheduled next month TB QuantiFERON negative April 2018   History of nosebleed and mucosal breakdown: Advised patient to follow up with ENT for evaluation  Fatigue and increased daytime somnolence: She will likely benefit from repeat sleep study, advised patient to discuss with PMD. She is trying to establish new PMD  Return in 6 months or sooner  K. Denzil Magnuson , MD [phone] Mon-Fri 8a-5p [phone] after 5p, weekends, holidays  CC: Kinnie Feil, MD

## 2017-01-10 ENCOUNTER — Other Ambulatory Visit: Payer: Self-pay | Admitting: *Deleted

## 2017-01-10 MED ORDER — NA SULFATE-K SULFATE-MG SULF 17.5-3.13-1.6 GM/177ML PO SOLN: 1.0000 | Freq: Once | ORAL | 0 refills | Status: AC

## 2017-01-13 ENCOUNTER — Other Ambulatory Visit: Payer: 59

## 2017-01-22 ENCOUNTER — Other Ambulatory Visit: Payer: 59

## 2017-01-24 ENCOUNTER — Encounter: Payer: Self-pay | Admitting: Internal Medicine

## 2017-01-24 ENCOUNTER — Ambulatory Visit (INDEPENDENT_AMBULATORY_CARE_PROVIDER_SITE_OTHER): Payer: 59 | Admitting: Internal Medicine

## 2017-01-24 VITALS — BP 108/76 | HR 65 | Temp 98.0°F | Resp 16 | Ht 66.0 in | Wt 149.0 lb

## 2017-01-24 DIAGNOSIS — E038 Other specified hypothyroidism: Secondary | ICD-10-CM

## 2017-01-24 DIAGNOSIS — N3281 Overactive bladder: Secondary | ICD-10-CM | POA: Insufficient documentation

## 2017-01-24 DIAGNOSIS — K5010 Crohn's disease of large intestine without complications: Secondary | ICD-10-CM | POA: Diagnosis not present

## 2017-01-24 DIAGNOSIS — I493 Ventricular premature depolarization: Secondary | ICD-10-CM | POA: Diagnosis not present

## 2017-01-24 DIAGNOSIS — F418 Other specified anxiety disorders: Secondary | ICD-10-CM

## 2017-01-24 DIAGNOSIS — Z853 Personal history of malignant neoplasm of breast: Secondary | ICD-10-CM

## 2017-01-24 NOTE — Progress Notes (Signed)
Subjective:    Patient ID: [name], female    DOB: 1966/07/15, 51 y.o.   MRN: [id number]  HPI She is here to establish with a new pcp.    Crohn's colitis:   She is on humira and follows regularly with Dr Silverio Decamp.  Her Crohn's is well controlled.    History of breast cancer:  She was diagnosed at 61.  Had a lumpectomy, had second lumpectmy.  5 years alater mastectomy.  Had chemo and radiatoin.   Hypothyroidism:  She is taking her medication daily.  She denies any recent changes in energy or weight that are unexplained.   Her skin never heals.  She has areas of her skin that scab and bleed.  She has an appointment with derm.     Medications and allergies reviewed with patient and updated if appropriate.  Patient Active Problem List   Diagnosis Date Noted  . History of breast cancer 01/24/2017  . Overactive bladder 01/24/2017  . PVC (premature ventricular contraction) 07/15/2016  . Insomnia 01/11/2014  . Anxiety associated with depression 11/09/2013  . Crohn's colitis ([place]) 11/09/2012  . Osteopenia 10/29/2012  . Hypothyroidism 04/22/2012  . GOITER, MULTINODULAR 07/14/2007  . INCONTINENCE, URGE 10/09/2006    Current Outpatient Prescriptions on File Prior to Visit  Medication Sig Dispense Refill  . Adalimumab (HUMIRA PEN) 40 MG/0.8ML PNKT Inject 1 pen into the skin every 14 (fourteen) days. Please schedule an appointment 2 each 2  . aspirin 81 MG tablet Take 81 mg by mouth as needed.     Marland Kitchen b complex vitamins capsule Take 1 capsule by mouth daily.    . Biotin 5000 MCG CAPS Take 1 capsule by mouth daily.     . Calcium Carb-Cholecalciferol (CALCIUM + D3 PO) Take 1 tablet by mouth daily.    Marland Kitchen desonide (DESOWEN) 0.05 % cream APPLY TOPICALLY TWICE DAILY 2 g 0  . escitalopram (LEXAPRO) 10 MG tablet TAKE ONE TABLET BY MOUTH DAILY (Patient taking differently: TAKE ONE TABLET BY MOUTH DAILY at bedtime) 90 tablet 1  . ferrous sulfate (SLOW FE) 160 (50 FE) MG TBCR Take 1 tablet (160  mg total) by mouth daily. 30 each 0  . fluticasone (FLONASE) 50 MCG/ACT nasal spray Place 2 sprays into both nostrils daily. In each nostril as needed 16 g 2  . ibuprofen (ADVIL,MOTRIN) 200 MG tablet Take 200 mg by mouth. Takes 2-3 tablets four times a day as needed    . levothyroxine (SYNTHROID, LEVOTHROID) 88 MCG tablet TAKE 1 TABLET (88 MCG) BY MOUTH DAILY BEFORE BREAKFAST. 90 tablet 2  . Multiple Vitamin (MULTIVITAMIN) tablet Take 1 tablet by mouth daily.    . Na Sulfate-K Sulfate-Mg Sulf 17.5-3.13-1.6 GM/180ML SOLN 1 suprep kit for colonoscopy 354 mL 0  . tolterodine (DETROL LA) 4 MG 24 hr capsule Take 1 capsule (4 mg total) by mouth daily as needed. 30 capsule 2  . Vaginal Lubricant (REPLENS) GEL USE AS DIRECTED 35 g 0  . VITAMIN E PO Take by mouth. 5000 mg daily    . [DISCONTINUED] tolterodine (DETROL LA) 4 MG 24 hr capsule Take 1 capsule (4 mg total) by mouth daily. 30 capsule 11   No current facility-administered medications on file prior to visit.     Past Medical History:  Diagnosis Date  . Anxiety   . Asthma   . Chronic headaches   . Granulomatous colitis ([place])   . History of bone density study    no osteopenia  .  History of MRI of cervical spine 4/03   mild DJD  . HX: breast cancer 11/98 and 12/03   recurrance  . Hypothyroidism   . IBS (irritable bowel syndrome)   . Inguinal hernia     Past Surgical History:  Procedure Laterality Date  . INGUINAL HERNIA REPAIR Right   . MASTECTOMY Right   . TOTAL ABDOMINAL HYSTERECTOMY W/ BILATERAL SALPINGOOPHORECTOMY  2007   left cervix in place    Social History   Social History  . Marital status: Single    Spouse name: N/A  . Number of children: 0  . Years of education: N/A   Occupational History  . managed care [employer]   Social History Main Topics  . Smoking status: Never Smoker  . Smokeless tobacco: Never Used  . Alcohol use Yes     Comment: occas  . Drug use: No  . Sexual activity: Not Asked   Other  Topics Concern  . None   Social History Narrative   Exercise; runs    Family History  Problem Relation Age of Onset  . Hypertension Mother   . Colon polyps Mother   . Irritable bowel syndrome Mother   . Skin cancer Father   . Breast cancer Paternal Aunt   . Cancer Paternal Aunt        breast  . Colon polyps Brother   . Psoriasis Brother   . Dermatomyositis Brother   . Diabetes Neg Hx   . Heart disease Neg Hx   . Stroke Neg Hx     Review of Systems  Constitutional: Negative for chills and fever.  Respiratory: Negative for cough, shortness of breath and wheezing.   Cardiovascular: Positive for palpitations (occasional). Negative for chest pain and leg swelling.  Gastrointestinal: Positive for abdominal distention (bloated), blood in stool (sometimes - often hemorrhoids) and diarrhea (occasional). Negative for abdominal pain and nausea.       Rare gerd  Endocrine: Positive for cold intolerance.  Genitourinary: Negative for dysuria and hematuria.  Musculoskeletal: Negative for arthralgias.  Skin:       Scabs, skin not healing  Neurological: Positive for light-headedness and headaches.  Psychiatric/Behavioral: Positive for dysphoric mood (controlled). The patient is nervous/anxious (controlled).        Objective:   Vitals:   01/24/17 1557  BP: 108/76  Pulse: 65  Resp: 16  Temp: 98 F (36.7 C)   Filed Weights   01/24/17 1557  Weight: 149 lb (67.6 kg)   Body mass index is 24.05 kg/m.  Wt Readings from Last 3 Encounters:  01/24/17 149 lb (67.6 kg)  01/08/17 149 lb 12.8 oz (67.9 kg)  07/15/16 149 lb (67.6 kg)     Physical Exam Constitutional: Appears well-developed and well-nourished. No distress.  HENT:  Head: Normocephalic and atraumatic.  Neck: Neck supple. No tracheal deviation present. No thyromegaly present.  No cervical lymphadenopathy Cardiovascular: Normal rate, regular rhythm and normal heart sounds.   No murmur heard. No carotid bruit .  No  edema Pulmonary/Chest: Effort normal and breath sounds normal. No respiratory distress. No has no wheezes. No rales.  Abdomen: soft, non tender, non distended, no HSM Skin: Skin is warm and dry. Not diaphoretic.  Psychiatric: Normal mood and affect. Behavior is normal.         Assessment & Plan:   See Problem List for Assessment and Plan of chronic medical problems.

## 2017-01-24 NOTE — Assessment & Plan Note (Signed)
tsh checked this year Has been stable on current dose Continue current dose

## 2017-01-24 NOTE — Assessment & Plan Note (Signed)
Controlled, stable Continue current dose of medication  

## 2017-01-24 NOTE — Assessment & Plan Note (Addendum)
Controlled with detrol LA Continue - as needed

## 2017-01-24 NOTE — Assessment & Plan Note (Signed)
She follows with oncology No evidence of recurrence

## 2017-01-24 NOTE — Patient Instructions (Signed)
   Medications reviewed and updated.  No changes recommended at this time.  Your prescription(s) have been submitted to your pharmacy. Please take as directed and contact our office if you believe you are having problem(s) with the medication(s).   Please followup annually

## 2017-01-24 NOTE — Assessment & Plan Note (Signed)
Follows with cardiology occasional

## 2017-02-03 ENCOUNTER — Encounter: Payer: Self-pay | Admitting: Gastroenterology

## 2017-02-06 ENCOUNTER — Ambulatory Visit (HOSPITAL_BASED_OUTPATIENT_CLINIC_OR_DEPARTMENT_OTHER): Payer: 59 | Admitting: Oncology

## 2017-02-06 VITALS — BP 114/74 | HR 55 | Temp 98.6°F | Resp 18 | Ht 66.0 in | Wt 150.6 lb

## 2017-02-06 DIAGNOSIS — Z853 Personal history of malignant neoplasm of breast: Secondary | ICD-10-CM | POA: Diagnosis not present

## 2017-02-06 NOTE — Progress Notes (Signed)
Error

## 2017-02-06 NOTE — Progress Notes (Signed)
  [facility] OFFICE PROGRESS NOTE   Diagnosis: Breast cancer  INTERVAL HISTORY:   [name] returns as scheduled. She continues Humira for treatment of Crohn's disease. This is working. No change over the chest wall. A left mammogram on 07/30/2016 was negative. Good appetite.  Objective:  Vital signs in last 24 hours:  Blood pressure 114/74, pulse (!) 55, temperature 98.6 F (37 C), temperature source Oral, resp. rate 18, height 5' 6"  (1.676 m), weight 150 lb 9.6 oz (68.3 kg), SpO2 98 %.    HEENT: Neck without mass Lymphatics: Pea-sized mobile left scalene node, no other cervical, supraclavicular, or axillary nodes Resp: Lungs clear bilaterally Cardio: Regular rate and rhythm GI: No hepatomegaly Vascular: No arm or leg edema Breasts: Status post right mastectomy with a TRAM reconstruction. No evidence for chest wall tumor recurrence. Left breast without mass.   Medications: I have reviewed the patient's current medications.  Assessment/Plan: [name] was diagnosed with right-sided breast cancer in November 1998. She developed a right breast recurrence in December 2003 and underwent a right mastectomy/TRAM reconstruction. She completed adjuvant AC and Taxol chemotherapy. She took tamoxifen August 2004 through July 2009. She began Femara 04/12/2008, discontinued at the end of 2014.   There is no evidence of recurrent breast cancer.  Disposition:  [name] remains in clinical remission from breast cancer. She will be scheduled for a left mammogram in December 2018. She will return for an office visit in November 2019. She will see Dr. Quay Burow in the interim.  The tiny left neck node is likely a benign finding.   15 minutes were spent with the patient today. The majority of the time was used for counseling and coordination of care.  Donneta Romberg, MD  02/06/2017  3:55 PM

## 2017-02-07 DIAGNOSIS — D2261 Melanocytic nevi of right upper limb, including shoulder: Secondary | ICD-10-CM | POA: Diagnosis not present

## 2017-02-07 DIAGNOSIS — D1801 Hemangioma of skin and subcutaneous tissue: Secondary | ICD-10-CM | POA: Diagnosis not present

## 2017-02-07 DIAGNOSIS — L821 Other seborrheic keratosis: Secondary | ICD-10-CM | POA: Diagnosis not present

## 2017-02-10 ENCOUNTER — Telehealth: Payer: Self-pay | Admitting: Oncology

## 2017-02-10 NOTE — Telephone Encounter (Signed)
Per 02/06/17 schedule patient for November 2019. Appointment schedule N/A for 06/2018 as yet.

## 2017-02-11 ENCOUNTER — Other Ambulatory Visit: Payer: Self-pay | Admitting: Gastroenterology

## 2017-02-17 ENCOUNTER — Ambulatory Visit (AMBULATORY_SURGERY_CENTER): Payer: 59 | Admitting: Gastroenterology

## 2017-02-17 ENCOUNTER — Encounter: Payer: Self-pay | Admitting: Gastroenterology

## 2017-02-17 VITALS — BP 124/66 | HR 61 | Temp 99.8°F | Resp 12 | Ht 66.0 in | Wt 150.0 lb

## 2017-02-17 DIAGNOSIS — K50119 Crohn's disease of large intestine with unspecified complications: Secondary | ICD-10-CM

## 2017-02-17 DIAGNOSIS — K5090 Crohn's disease, unspecified, without complications: Secondary | ICD-10-CM | POA: Diagnosis not present

## 2017-02-17 DIAGNOSIS — Z1211 Encounter for screening for malignant neoplasm of colon: Secondary | ICD-10-CM

## 2017-02-17 MED ORDER — SODIUM CHLORIDE 0.9 % IV SOLN: 500.0000 mL | INTRAVENOUS | Status: DC

## 2017-02-17 NOTE — Patient Instructions (Signed)
Biopsies taken today. Result letter in your mail in 2-3 weeks. Resume current medications.  Handout given on hemorrhoids. Follow up with Dr.Nandigam in 6 months. Call to make that appointment.  Call us with any questions or concerns. Thank you!   YOU HAD AN ENDOSCOPIC PROCEDURE TODAY AT [facility]:   Refer to the procedure report that was given to you for any specific questions about what was found during the examination.  If the procedure report does not answer your questions, please call your gastroenterologist to clarify.  If you requested that your care partner not be given the details of your procedure findings, then the procedure report has been included in a sealed envelope for you to review at your convenience later.  YOU SHOULD EXPECT: Some feelings of bloating in the abdomen. Passage of more gas than usual.  Walking can help get rid of the air that was put into your GI tract during the procedure and reduce the bloating. If you had a lower endoscopy (such as a colonoscopy or flexible sigmoidoscopy) you may notice spotting of blood in your stool or on the toilet paper. If you underwent a bowel prep for your procedure, you may not have a normal bowel movement for a few days.  Please Note:  You might notice some irritation and congestion in your nose or some drainage.  This is from the oxygen used during your procedure.  There is no need for concern and it should clear up in a day or so.  SYMPTOMS TO REPORT IMMEDIATELY:   Following lower endoscopy (colonoscopy or flexible sigmoidoscopy):  Excessive amounts of blood in the stool  Significant tenderness or worsening of abdominal pains  Swelling of the abdomen that is new, acute  Fever of 100F or higher  For urgent or emergent issues, a gastroenterologist can be reached at any hour by calling [phone].   DIET:  We do recommend a small meal at first, but then you may proceed to your regular diet.  Drink plenty of  fluids but you should avoid alcoholic beverages for 24 hours.  ACTIVITY:  You should plan to take it easy for the rest of today and you should NOT DRIVE or use heavy machinery until tomorrow (because of the sedation medicines used during the test).    FOLLOW UP: Our staff will call the number listed on your records the next business day following your procedure to check on you and address any questions or concerns that you may have regarding the information given to you following your procedure. If we do not reach you, we will leave a message.  However, if you are feeling well and you are not experiencing any problems, there is no need to return our call.  We will assume that you have returned to your regular daily activities without incident.  If any biopsies were taken you will be contacted by phone or by letter within the next 1-3 weeks.  Please call us at [phone] if you have not heard about the biopsies in 3 weeks.    SIGNATURES/CONFIDENTIALITY: You and/or your care partner have signed paperwork which will be entered into your electronic medical record.  These signatures attest to the fact that that the information above on your After Visit Summary has been reviewed and is understood.  Full responsibility of the confidentiality of this discharge information lies with you and/or your care-partner.

## 2017-02-17 NOTE — Op Note (Signed)
[place] Patient Name: [name] Procedure Date: 02/17/2017 10:23 AM MRN: [id number] Endoscopist: Mauri Pole , MD Age: 51 Referring MD:  Date of Birth: Mar 03, 1966 Gender: Female Account #: [id number] Procedure:                Colonoscopy Indications:              High risk colon cancer surveillance: Crohn's disease Medicines:                Monitored Anesthesia Care Procedure:                Pre-Anesthesia Assessment:                           - Prior to the procedure, a History and Physical                            was performed, and patient medications and                            allergies were reviewed. The patient's tolerance of                            previous anesthesia was also reviewed. The risks                            and benefits of the procedure and the sedation                            options and risks were discussed with the patient.                            All questions were answered, and informed consent                            was obtained. Prior Anticoagulants: The patient has                            taken no previous anticoagulant or antiplatelet                            agents. ASA Grade Assessment: II - A patient with                            mild systemic disease. After reviewing the risks                            and benefits, the patient was deemed in                            satisfactory condition to undergo the procedure.                           After obtaining informed consent, the colonoscope  was passed under direct vision. Throughout the                            procedure, the patient's blood pressure, pulse, and                            oxygen saturations were monitored continuously. The                            Colonoscope was introduced through the anus and                            advanced to the the terminal ileum, with   identification of the appendiceal orifice and IC                            valve. The colonoscopy was performed without                            difficulty. The patient tolerated the procedure                            well. The quality of the bowel preparation was                            excellent. The terminal ileum, ileocecal valve,                            appendiceal orifice, and rectum were photographed. Scope In: 10:28:42 AM Scope Out: 10:45:28 AM Scope Withdrawal Time: 0 hours 9 minutes 37 seconds  Total Procedure Duration: 0 hours 16 minutes 46 seconds  Findings:                 The perianal and digital rectal examinations were                            normal.                           The Simple Endoscopic Score for Crohn's Disease was                            determined based on the endoscopic appearance of                            the mucosa in the following segments:                           - Ileum: Findings include no ulcers present, no                            ulcerated surfaces, no affected surfaces and no                            narrowings.                           -  Right Colon: Findings include no ulcers present,                            no ulcerated surfaces, no affected surfaces and no                            narrowings.                           - Transverse Colon: Findings include no ulcers                            present, no ulcerated surfaces, no affected                            surfaces and no narrowings.                           - Left Colon: Findings include no ulcers present,                            no ulcerated surfaces, less than 50% of surfaces                            affected (erythema, scarring and altered                            vasculature) and no narrowings.                           - Rectum: Findings include no ulcers present, no                            ulcerated surfaces, less than 50% of surfaces                             affected (erythema, scarring and altered                            vasculature) and no narrowings.                           - Total SES-CD scoring: total aggregate score was                            2. Biopsies were taken with a cold forceps for                            histology.                           Non-bleeding internal hemorrhoids were found during                            retroflexion. The hemorrhoids were small. Complications:  No immediate complications. Estimated Blood Loss:     Estimated blood loss was minimal. Impression:               - Simple Endoscopic Score for Crohn's Disease:                            total aggregate score was 2, mucosal inflammatory                            changes secondary to Crohn's disease, in remission.                            Biopsied.                           - Non-bleeding internal hemorrhoids. Recommendation:           - Patient has a contact number available for                            emergencies. The signs and symptoms of potential                            delayed complications were discussed with the                            patient. Return to normal activities tomorrow.                            Written discharge instructions were provided to the                            patient.                           - Resume previous diet.                           - Continue present medications.                           - Await pathology results.                           - Repeat colonoscopy date to be determined after                            pending pathology results are reviewed for                            surveillance based on pathology results.                           - Return to GI clinic in 6 months. Mauri Pole, MD 02/17/2017 10:52:12 AM This report has been signed electronically.

## 2017-02-17 NOTE — Progress Notes (Signed)
Called to room to assist during endoscopic procedure.  Patient ID and intended procedure confirmed with present staff. Received instructions for my participation in the procedure from the performing physician.  

## 2017-02-17 NOTE — Progress Notes (Signed)
Pt. Reports no change in her surgical or medical history since pre-visit 01/08/2017.

## 2017-02-17 NOTE — Progress Notes (Signed)
To recovery, report to Doyle Askew, Therapist, sports, V[facility]

## 2017-02-18 ENCOUNTER — Telehealth: Payer: Self-pay | Admitting: *Deleted

## 2017-02-18 NOTE — Telephone Encounter (Signed)
  Follow up Call-  Call back number 02/17/2017  Post procedure Call Back phone  # [phone]  Permission to leave phone message Yes  Some recent data might be hidden     Patient questions:  Do you have a fever, pain , or abdominal swelling? No. Pain Score  0 *  Have you tolerated food without any problems? Yes.    Have you been able to return to your normal activities? Yes.    Do you have any questions about your discharge instructions: Diet   No. Medications  No. Follow up visit  No.  Do you have questions or concerns about your Care? No.  Actions: * If pain score is 4 or above: No action needed, pain <4.

## 2017-02-24 ENCOUNTER — Encounter: Payer: Self-pay | Admitting: Gastroenterology

## 2017-04-03 ENCOUNTER — Encounter (HOSPITAL_COMMUNITY): Payer: Self-pay | Admitting: Emergency Medicine

## 2017-04-03 ENCOUNTER — Emergency Department (HOSPITAL_COMMUNITY)
Admission: EM | Admit: 2017-04-03 | Discharge: 2017-04-03 | Disposition: A | Discharge status: Home / Self Care | Payer: 59 | Attending: Emergency Medicine | Admitting: Emergency Medicine

## 2017-04-03 DIAGNOSIS — Z853 Personal history of malignant neoplasm of breast: Secondary | ICD-10-CM | POA: Diagnosis not present

## 2017-04-03 DIAGNOSIS — Z7982 Long term (current) use of aspirin: Secondary | ICD-10-CM | POA: Diagnosis not present

## 2017-04-03 DIAGNOSIS — T781XXA Other adverse food reactions, not elsewhere classified, initial encounter: Secondary | ICD-10-CM | POA: Diagnosis not present

## 2017-04-03 DIAGNOSIS — Z79899 Other long term (current) drug therapy: Secondary | ICD-10-CM | POA: Insufficient documentation

## 2017-04-03 DIAGNOSIS — J45909 Unspecified asthma, uncomplicated: Secondary | ICD-10-CM | POA: Insufficient documentation

## 2017-04-03 DIAGNOSIS — T783XXA Angioneurotic edema, initial encounter: Secondary | ICD-10-CM | POA: Diagnosis present

## 2017-04-03 MED ORDER — HYDROXYZINE HCL 25 MG PO TABS: 25.0000 mg | ORAL_TABLET | Freq: Four times a day (QID) | ORAL | 0 refills | Status: DC

## 2017-04-03 MED ORDER — METHYLPREDNISOLONE SODIUM SUCC 125 MG IJ SOLR
60.0000 mg | Freq: Once | INTRAMUSCULAR | Status: AC
  Administered 2017-04-03: 60 mg via INTRAMUSCULAR
  Filled 2017-04-03: qty 2

## 2017-04-03 MED ORDER — PREDNISONE 10 MG (21) PO TBPK: ORAL_TABLET | ORAL | 0 refills | Status: DC

## 2017-04-03 MED ORDER — METHYLPREDNISOLONE SODIUM SUCC 125 MG IJ SOLR: 60.0000 mg | Freq: Once | INTRAMUSCULAR | Status: DC

## 2017-04-03 MED ORDER — RANITIDINE HCL 150 MG/10ML PO SYRP
150.0000 mg | ORAL_SOLUTION | Freq: Once | ORAL | Status: AC
  Administered 2017-04-03: 150 mg via ORAL
  Filled 2017-04-03: qty 10

## 2017-04-03 NOTE — ED Provider Notes (Signed)
[facility] DEPT Provider Note   CSN: [id number] Arrival date & time: 04/03/17  2039     History   Chief Complaint Chief Complaint  Patient presents with  . Allergic Reaction    to left over fish that she had eaten a day prior    HPI [name] is a 51 y.o. female with a chief complaint of swelling to the gums and lips that began suddenly this evening. The patient reports that she was walking on her treadmill and eating a leftover fish sandwich when she felt tingling on her gums and tongue. She reports that then her lips started to swell. She called her mom who is a nurse who recommended she take Benadryl. 2 tablets of Benadryl taken prior to arrival. She reports the tingling continued down her throat into her chest. She denies shortness of breath, difficulty breathing or swallowing, cough, fever, or chills. She reports that she feels her symptoms have started to improve after taking the Benadryl since arriving to the emergency department. No history of food allergies.   HPI  Past Medical History:  Diagnosis Date  . Anxiety   . Asthma   . Chronic headaches   . Granulomatous colitis ([place])   . History of bone density study    no osteopenia  . History of MRI of cervical spine 4/03   mild DJD  . HX: breast cancer 11/98 and 12/03   recurrance  . Hypothyroidism   . IBS (irritable bowel syndrome)   . Inguinal hernia     Patient Active Problem List   Diagnosis Date Noted  . History of breast cancer 01/24/2017  . Overactive bladder 01/24/2017  . PVC (premature ventricular contraction) 07/15/2016  . Insomnia 01/11/2014  . Anxiety associated with depression 11/09/2013  . Crohn's colitis ([place]) 11/09/2012  . Osteopenia 10/29/2012  . Hypothyroidism 04/22/2012  . GOITER, MULTINODULAR 07/14/2007  . INCONTINENCE, URGE 10/09/2006    Past Surgical History:  Procedure Laterality Date  . INGUINAL HERNIA REPAIR Right   . MASTECTOMY Right   . TOTAL ABDOMINAL HYSTERECTOMY W/  BILATERAL SALPINGOOPHORECTOMY  2007   left cervix in place    OB History    No data available       Home Medications    Prior to Admission medications   Medication Sig Start Date End Date Taking? Authorizing Provider  b complex vitamins capsule Take 1 capsule by mouth at bedtime.    Yes [provider]  Biotin 5000 MCG CAPS Take 5,000 mcg by mouth 2 (two) times daily.    Yes [provider]  Calcium Carb-Cholecalciferol (CALCIUM + D3 PO) Take 1 tablet by mouth at bedtime.    Yes [provider]  desonide (DESOWEN) 0.05 % cream APPLY TOPICALLY TWICE DAILY 07/31/15  Yes Eniola, Kehinde T, MD  escitalopram (LEXAPRO) 10 MG tablet TAKE ONE TABLET BY MOUTH DAILY Patient taking differently: TAKE ONE TABLET BY MOUTH DAILY at bedtime 12/10/16  Yes Eniola, Kehinde T, MD  fluticasone (FLONASE) 50 MCG/ACT nasal spray Place 2 sprays into both nostrils daily. In each nostril as needed Patient taking differently: Place 2 sprays into both nostrils daily as needed for allergies.  09/04/16  Yes Eniola, Kehinde T, MD  HUMIRA PEN 40 MG/0.8ML PNKT INJECT 1 PREFILLED PEN (40MG) SUBCUTANEOUSLY EVERY 14 DAYS *PLEASE SCHEDULE APPOINTMENT* 02/11/17  Yes Nandigam, Venia Minks, MD  ibuprofen (ADVIL,MOTRIN) 200 MG tablet Take 600 mg by mouth every 6 (six) hours as needed for moderate pain.  Yes [provider]  levothyroxine (SYNTHROID, LEVOTHROID) 88 MCG tablet TAKE 1 TABLET (88 MCG) BY MOUTH DAILY BEFORE BREAKFAST. 07/08/16  Yes Kinnie Feil, MD  Multiple Vitamin (MULTIVITAMIN) tablet Take 1 tablet by mouth daily.   Yes [provider]  PSYLLIUM HUSK PO Take 8 tablets by mouth daily.   Yes [provider]  tolterodine (DETROL LA) 4 MG 24 hr capsule Take 1 capsule (4 mg total) by mouth daily as needed. Patient taking differently: Take 4 mg by mouth daily as needed (incontinence).  07/30/16  Yes Kinnie Feil, MD  Vaginal Lubricant (REPLENS) GEL USE AS  DIRECTED Patient taking differently: One application Daily as needed for dryness 06/24/11  Yes Martinique, Sarah T, MD  VITAMIN E PO Take 1 capsule by mouth daily.    Yes [provider]  aspirin 81 MG tablet Take 81 mg by mouth daily.     [provider]  predniSONE (STERAPRED UNI-PAK 21 TAB) 10 MG (21) TBPK tablet Take 6 tabs day 1, 5 tabs day 2, 4 tabs day 3, 3 tabs day 4, 2 tabs day 5, and 1 on day 6 04/03/17   McDonald, Mia A, PA-C    Family History Family History  Problem Relation Age of Onset  . Hypertension Mother   . Colon polyps Mother   . Irritable bowel syndrome Mother   . Skin cancer Father   . Breast cancer Paternal Aunt   . Cancer Paternal Aunt        breast  . Colon polyps Brother   . Psoriasis Brother   . Dermatomyositis Brother   . Diabetes Neg Hx   . Heart disease Neg Hx   . Stroke Neg Hx     Social History Social History  Substance Use Topics  . Smoking status: Never Smoker  . Smokeless tobacco: Never Used  . Alcohol use Yes     Comment: occas     Allergies   Sulfa antibiotics   Review of Systems Review of Systems  Constitutional: Negative for chills and fever.  HENT: Positive for facial swelling and sore throat. Negative for mouth sores and trouble swallowing.   Respiratory: Negative for cough and shortness of breath.   Cardiovascular: Negative for chest pain.  Gastrointestinal: Negative for abdominal pain, diarrhea, nausea and vomiting.  Allergic/Immunologic: Negative for food allergies.     Physical Exam Updated Vital Signs BP 120/76 (BP Location: Right Arm)   Pulse 60   Temp 98.3 F (36.8 C) (Oral)   Resp 18   SpO2 99%   Physical Exam  Constitutional: No distress.  Sitting comfortably in the exam chair.  HENT:  Head: Normocephalic.  Mouth/Throat: Uvula is midline, oropharynx is clear and moist and mucous membranes are normal. No oral lesions. No trismus in the jaw. Normal dentition. No uvula swelling. No posterior  oropharyngeal edema or posterior oropharyngeal erythema.  Eyes: Conjunctivae are normal.  Neck: Neck supple.  Cardiovascular: Normal rate, regular rhythm and normal heart sounds.  Exam reveals no gallop and no friction rub.   No murmur heard. Pulmonary/Chest: Effort normal and breath sounds normal. No respiratory distress. She has no wheezes. She has no rales. She exhibits no tenderness.  Abdominal: Soft. She exhibits no distension.  Neurological: She is alert.  Skin: Skin is warm. No rash noted.  Psychiatric: Her behavior is normal.  Nursing note and vitals reviewed.  ED Treatments / Results  Labs (all labs ordered are listed, but only abnormal results  are displayed) Labs Reviewed - No data to display  EKG  EKG Interpretation None       Radiology No results found.  Procedures Procedures (including critical care time)  Medications Ordered in ED Medications  ranitidine (ZANTAC) 150 MG/10ML syrup 150 mg (150 mg Oral Given 04/03/17 2155)  methylPREDNISolone sodium succinate (SOLU-MEDROL) 125 mg/2 mL injection 60 mg (60 mg Intramuscular Given 04/03/17 2156)     Initial Impression / Assessment and Plan / ED Course  I have reviewed the triage vital signs and the nursing notes.  Pertinent labs & imaging results that were available during my care of the patient were reviewed by me and considered in my medical decision making (see chart for details).     Patient re-evaluated prior to dc, is hemodynamically stable, in no respiratory distress, and denies the feeling of throat closing. Will d/c the patient with a prednisone taper & return to the ED if they have a mod-severe allergic rxn (s/s including throat closing, difficulty breathing, swelling of lips face or tongue). Pt is to follow up with their PCP. Pt is agreeable with plan & verbalizes understanding.  Final Clinical Impressions(s) / ED Diagnoses   Final diagnoses:  Allergic reaction to food, initial encounter    New  Prescriptions New Prescriptions   PREDNISONE (STERAPRED UNI-PAK 21 TAB) 10 MG (21) TBPK TABLET    Take 6 tabs day 1, 5 tabs day 2, 4 tabs day 3, 3 tabs day 4, 2 tabs day 5, and 1 on day 6     McDonald, Mia A, PA-C 04/03/17 2234    Fatima Blank, MD 04/04/17 [phone]

## 2017-04-03 NOTE — ED Triage Notes (Signed)
Pt states she was eating left over fish from 1 day ago when she noticed her lips, tongue and gums swelling and felt her throat feeling sore. Pt took 2 tabs benadryl prior to coming to the ED

## 2017-04-03 NOTE — Discharge Instructions (Signed)
Please take prednisone as follows: 6 tabs day 1 (tomorrow), 5 tabs day 2, 4 tabs day 3, 3 tabs day 4, 2 tabs day 5, and 1 on day 6. Please make sure to take all this medication and do not stop halfway through that dose pack.  If you develop new or worsening symptoms, including difficulty breathing, shortness of breath, or swelling of her lips, please return to the emergency department for reevaluation.

## 2017-05-06 DIAGNOSIS — K5090 Crohn's disease, unspecified, without complications: Secondary | ICD-10-CM | POA: Diagnosis not present

## 2017-05-06 DIAGNOSIS — R040 Epistaxis: Secondary | ICD-10-CM | POA: Diagnosis not present

## 2017-05-06 DIAGNOSIS — J3489 Other specified disorders of nose and nasal sinuses: Secondary | ICD-10-CM | POA: Diagnosis not present

## 2017-06-27 ENCOUNTER — Encounter (HOSPITAL_BASED_OUTPATIENT_CLINIC_OR_DEPARTMENT_OTHER): Payer: Self-pay | Admitting: *Deleted

## 2017-06-27 ENCOUNTER — Emergency Department (HOSPITAL_BASED_OUTPATIENT_CLINIC_OR_DEPARTMENT_OTHER)
Admission: EM | Admit: 2017-06-27 | Discharge: 2017-06-27 | Disposition: A | Discharge status: Home / Self Care | Payer: Worker's Compensation | Attending: Emergency Medicine | Admitting: Emergency Medicine

## 2017-06-27 ENCOUNTER — Emergency Department (HOSPITAL_BASED_OUTPATIENT_CLINIC_OR_DEPARTMENT_OTHER): Payer: Worker's Compensation

## 2017-06-27 ENCOUNTER — Other Ambulatory Visit: Payer: Self-pay

## 2017-06-27 DIAGNOSIS — M25561 Pain in right knee: Secondary | ICD-10-CM | POA: Insufficient documentation

## 2017-06-27 DIAGNOSIS — Z79899 Other long term (current) drug therapy: Secondary | ICD-10-CM | POA: Diagnosis not present

## 2017-06-27 DIAGNOSIS — Y998 Other external cause status: Secondary | ICD-10-CM | POA: Diagnosis not present

## 2017-06-27 DIAGNOSIS — W19XXXA Unspecified fall, initial encounter: Secondary | ICD-10-CM | POA: Diagnosis not present

## 2017-06-27 DIAGNOSIS — E039 Hypothyroidism, unspecified: Secondary | ICD-10-CM | POA: Diagnosis not present

## 2017-06-27 DIAGNOSIS — S62306A Unspecified fracture of fifth metacarpal bone, right hand, initial encounter for closed fracture: Secondary | ICD-10-CM | POA: Diagnosis not present

## 2017-06-27 DIAGNOSIS — Y939 Activity, unspecified: Secondary | ICD-10-CM | POA: Diagnosis not present

## 2017-06-27 DIAGNOSIS — J45909 Unspecified asthma, uncomplicated: Secondary | ICD-10-CM | POA: Diagnosis not present

## 2017-06-27 DIAGNOSIS — Y929 Unspecified place or not applicable: Secondary | ICD-10-CM | POA: Insufficient documentation

## 2017-06-27 DIAGNOSIS — Z853 Personal history of malignant neoplasm of breast: Secondary | ICD-10-CM | POA: Insufficient documentation

## 2017-06-27 DIAGNOSIS — S6991XA Unspecified injury of right wrist, hand and finger(s), initial encounter: Secondary | ICD-10-CM | POA: Diagnosis present

## 2017-06-27 MED ORDER — HYDROCODONE-ACETAMINOPHEN 5-325 MG PO TABS: 1.0000 | ORAL_TABLET | Freq: Four times a day (QID) | ORAL | 0 refills | Status: DC | PRN

## 2017-06-27 NOTE — ED Provider Notes (Signed)
[facility] EMERGENCY DEPARTMENT Provider Note   CSN: [id number] Arrival date & time: 06/27/17  1334     History   Chief Complaint Chief Complaint  Patient presents with  . Fall    HPI [name] is a 51 y.o. female presenting for evaluation after a fall.   Patient states that she had a mechanical fall yesterday and fell face first.  She hit her nose, right knee, and right hand.  She denies loss of consciousness.  She has not a blood thinners.  She was able to ambulate afterwards without difficulty.  Today she reports continued pain of her nose, hand, and knee.  Nothing has made it better, movement and palpation makes it worse.  She reports no pain when she is still. She is still ambulatory with a limp.  She denies headache, vision changes, slurred speech, neck pain, back pain, nausea, vomiting, abdominal pain, urinary symptoms, loss of bowel or bladder control, numbness, or tingling.  Patient takes Humira for Crohn's, no other medical problems or medications.  HPI  Past Medical History:  Diagnosis Date  . Anxiety   . Asthma   . Chronic headaches   . Granulomatous colitis ([place])   . History of bone density study    no osteopenia  . History of MRI of cervical spine 4/03   mild DJD  . HX: breast cancer 11/98 and 12/03   recurrance  . Hypothyroidism   . IBS (irritable bowel syndrome)   . Inguinal hernia     Patient Active Problem List   Diagnosis Date Noted  . History of breast cancer 01/24/2017  . Overactive bladder 01/24/2017  . PVC (premature ventricular contraction) 07/15/2016  . Insomnia 01/11/2014  . Anxiety associated with depression 11/09/2013  . Crohn's colitis ([place]) 11/09/2012  . Osteopenia 10/29/2012  . Hypothyroidism 04/22/2012  . GOITER, MULTINODULAR 07/14/2007  . INCONTINENCE, URGE 10/09/2006    Past Surgical History:  Procedure Laterality Date  . INGUINAL HERNIA REPAIR Right   . MASTECTOMY Right   . TOTAL ABDOMINAL HYSTERECTOMY W/  BILATERAL SALPINGOOPHORECTOMY  2007   left cervix in place    OB History    No data available       Home Medications    Prior to Admission medications   Medication Sig Start Date End Date Taking? Authorizing Provider  b complex vitamins capsule Take 1 capsule by mouth at bedtime.    Yes [provider]  Biotin 5000 MCG CAPS Take 5,000 mcg by mouth 2 (two) times daily.    Yes [provider]  Calcium Carb-Cholecalciferol (CALCIUM + D3 PO) Take 1 tablet by mouth at bedtime.    Yes [provider]  desonide (DESOWEN) 0.05 % cream APPLY TOPICALLY TWICE DAILY 07/31/15  Yes Eniola, Kehinde T, MD  escitalopram (LEXAPRO) 10 MG tablet TAKE ONE TABLET BY MOUTH DAILY Patient taking differently: TAKE ONE TABLET BY MOUTH DAILY at bedtime 12/10/16  Yes Eniola, Kehinde T, MD  fluticasone (FLONASE) 50 MCG/ACT nasal spray Place 2 sprays into both nostrils daily. In each nostril as needed Patient taking differently: Place 2 sprays into both nostrils daily as needed for allergies.  09/04/16  Yes Eniola, Kehinde T, MD  HUMIRA PEN 40 MG/0.8ML PNKT INJECT 1 PREFILLED PEN (40MG) SUBCUTANEOUSLY EVERY 14 DAYS *PLEASE SCHEDULE APPOINTMENT* 02/11/17  Yes Nandigam, Venia Minks, MD  hydrOXYzine (ATARAX/VISTARIL) 25 MG tablet Take 1 tablet (25 mg total) by mouth every 6 (six) hours. 04/03/17  Yes McDonald, Mia A, PA-C  ibuprofen (ADVIL,MOTRIN) 200 MG tablet Take 600 mg by mouth every 6 (six) hours as needed for moderate pain.    Yes [provider]  levothyroxine (SYNTHROID, LEVOTHROID) 88 MCG tablet TAKE 1 TABLET (88 MCG) BY MOUTH DAILY BEFORE BREAKFAST. 07/08/16  Yes Kinnie Feil, MD  Multiple Vitamin (MULTIVITAMIN) tablet Take 1 tablet by mouth daily.   Yes [provider]  PSYLLIUM HUSK PO Take 8 tablets by mouth daily.   Yes [provider]  tolterodine (DETROL LA) 4 MG 24 hr capsule Take 1 capsule (4 mg total) by mouth daily as needed. Patient taking  differently: Take 4 mg by mouth daily as needed (incontinence).  07/30/16  Yes Kinnie Feil, MD  Vaginal Lubricant (REPLENS) GEL USE AS DIRECTED Patient taking differently: One application Daily as needed for dryness 06/24/11  Yes Martinique, Sarah T, MD  VITAMIN E PO Take 1 capsule by mouth daily.    Yes [provider]  aspirin 81 MG tablet Take 81 mg by mouth daily.     [provider]  HYDROcodone-acetaminophen (NORCO/VICODIN) 5-325 MG tablet Take 1 tablet every 6 (six) hours as needed by mouth for severe pain. 06/27/17   Hristopher Missildine, PA-C  predniSONE (STERAPRED UNI-PAK 21 TAB) 10 MG (21) TBPK tablet Take 6 tabs day 1, 5 tabs day 2, 4 tabs day 3, 3 tabs day 4, 2 tabs day 5, and 1 on day 6 04/03/17   McDonald, Mia A, PA-C    Family History Family History  Problem Relation Age of Onset  . Hypertension Mother   . Colon polyps Mother   . Irritable bowel syndrome Mother   . Skin cancer Father   . Breast cancer Paternal Aunt   . Cancer Paternal Aunt        breast  . Colon polyps Brother   . Psoriasis Brother   . Dermatomyositis Brother   . Diabetes Neg Hx   . Heart disease Neg Hx   . Stroke Neg Hx     Social History Social History   Tobacco Use  . Smoking status: Never Smoker  . Smokeless tobacco: Never Used  Substance Use Topics  . Alcohol use: Yes    Comment: occas  . Drug use: No     Allergies   Sulfa antibiotics   Review of Systems Review of Systems  Musculoskeletal: Positive for arthralgias and joint swelling. Negative for back pain and neck pain.  Skin: Positive for wound.     Physical Exam Updated Vital Signs BP 109/81   Pulse 60   Temp 97.9 F (36.6 C) (Oral)   Resp 20   Ht 5' 5"  (1.651 m)   Wt 68 kg (150 lb)   SpO2 100%   BMI 24.96 kg/m   Physical Exam  Constitutional: She is oriented to person, place, and time. She appears well-developed and well-nourished. No distress.  HENT:  Head: Normocephalic.  Right Ear:  Tympanic membrane, external ear and ear canal normal.  Left Ear: Tympanic membrane, external ear and ear canal normal.  Nose: Nose normal.  Mouth/Throat: Uvula is midline, oropharynx is clear and moist and mucous membranes are normal.  Small nondraining abrasion of the tip of the nose.  No hematoma of the bridge or septum.  No tenderness to palpation around the orbits or the bridge of the nose.  No other injury of the scalp or head noted.  Eyes: EOM are normal. Pupils are equal, round, and reactive to light.  Neck:  Normal range of motion.  Full ROM of head and neck without pain. No TTP of midline c-spine  Cardiovascular: Normal rate, regular rhythm and intact distal pulses.  Pulmonary/Chest: Effort normal and breath sounds normal. She exhibits no tenderness.  Abdominal: Soft. She exhibits no distension. There is no tenderness.  Musculoskeletal: She exhibits edema and tenderness.  Patient was swelling and bruising of the right hand.  On the lateral tenderness palpation along the metacarpal.  Full active range of motion the fingers with minimal pain.  No pain of the wrist.  Radial pulses intact bilaterally.  Strength against resistance intact.  Sensation intact bilaterally. Patient with bruise of the anterior knee.  No tenderness palpation along the joint line.  No pain with varus or valgus stress.  No pain with passive range of motion, increased pain with active range of motion.  Pedal pulses equal bilaterally.  Sensation intact bilaterally.  Color and warmth equal bilaterally.  Soft compartments.   No tenderness to palpation of the back or midline spine.  No injury noted elsewhere.  Neurological: She is alert and oriented to person, place, and time. She has normal strength. No cranial nerve deficit or sensory deficit. GCS eye subscore is 4. GCS verbal subscore is 5. GCS motor subscore is 6.  Fine movement and coordination intact  Skin: Skin is warm.  Psychiatric: She has a normal mood and affect.    Nursing note and vitals reviewed.    ED Treatments / Results  Labs (all labs ordered are listed, but only abnormal results are displayed) Labs Reviewed - No data to display  EKG  EKG Interpretation None       Radiology Dg Hand Complete Right  Result Date: 06/27/2017 CLINICAL DATA:  Pain following fall EXAM: RIGHT HAND - COMPLETE 3+ VIEW COMPARISON:  None. FINDINGS: Frontal, oblique, and lateral views were obtained. There is soft tissue swelling dorsally. There is a nondisplaced obliquely oriented fracture through the mid the distal aspect fifth metacarpal. No other fracture. No dislocation. The joint spaces appear normal. No erosive change. IMPRESSION: Nondisplaced fracture mid the distal aspect fifth metacarpal. Diffuse soft tissue swelling dorsally. No other fractures. No dislocation. No appreciable arthropathic change. Electronically Signed   By: Lowella Grip III M.D.   On: 06/27/2017 14:20    Procedures Procedures (including critical care time)  Medications Ordered in ED Medications - No data to display   Initial Impression / Assessment and Plan / ED Course  I have reviewed the triage vital signs and the nursing notes.  Pertinent labs & imaging results that were available during my care of the patient were reviewed by me and considered in my medical decision making (see chart for details).     Patient presenting for evaluation after a fall yesterday.  Physical exam shows patient is neurovascularly intact.  No obvious neurologic deficits.  I do not believe we need to do a scan of the head or neck today, she is asymptomatic and without signs of head injury.  X-ray of hand shows nondisplaced fracture of the fifth metacarpal.  Discussed option of x-ray of right knee, patient elects not to do so today.  Likely soft tissue injury.  Will apply knee sleeve and treat conservatively.  Ulnar gutter splint applied for fracture.  Evaluation after splint placement showed good cap  refill, sensation intact, patient able to move her distal fingers. Patient to treat with anti-inflammatories and Norco as needed for breakthrough pain.  Case discussed with Dr. Grandville Silos from hand  surgery, and his office will be the patient to call regarding an appointment.  Discussed plan with patient.  At this time, patient appears safe for discharge.  Return precautions given.  Patient states she understands and agrees to plan.   Final Clinical Impressions(s) / ED Diagnoses   Final diagnoses:  Closed nondisplaced fracture of fifth metacarpal bone of right hand, unspecified portion of metacarpal, initial encounter  Acute pain of right knee  Fall, initial encounter    ED Discharge Orders        Ordered    HYDROcodone-acetaminophen (NORCO/VICODIN) 5-325 MG tablet  Every 6 hours PRN     06/27/17 Josephine, Zaleigh Bermingham, PA-C 06/27/17 1711    Drenda Freeze, MD 06/27/17 2330

## 2017-06-27 NOTE — ED Triage Notes (Addendum)
She fell on the sidewalk yesterday in the rain. Abrasion to her nose. Right knee right hand pain. Bruising and swelling to her hand.

## 2017-06-27 NOTE — Discharge Instructions (Signed)
Take ibuprofen 3 times a day with meals.  Take 600 mg, 3 over-the-counter pills, with each dose.  Do not take other anti-inflammatories at the same time.  You may supplement with Norco as needed for breakthrough pain.  You may supplement with Tylenol if you have persistent mild pain. Follow-up with Dr. Grandville Silos from hand surgery for further management of your hand fracture.  Return to the emergency room if you develop whiteness of the tips of her finger, inability to feel the tips of your fingers, fevers, or any new or worsening symptoms.

## 2017-07-10 ENCOUNTER — Other Ambulatory Visit: Payer: Self-pay | Admitting: Family Medicine

## 2017-07-23 ENCOUNTER — Telehealth: Payer: Self-pay | Admitting: Internal Medicine

## 2017-07-23 NOTE — Telephone Encounter (Signed)
Appointment 6/18- states TSH was checked and stable. Not in records- OK to fill?

## 2017-07-23 NOTE — Telephone Encounter (Signed)
Copied from [place]. Topic: Quick Communication - See Telephone Encounter >> Jul 23, 2017  2:47 PM Synthia Innocent wrote: CRM for notification. See Telephone encounter for:  Requesting refill on levothyroxine (SYNTHROID, LEVOTHROID) 88 MCG tablet, Kristopher Oppenheim at [facility], Dr Quay Burow has never ordered 07/23/17.

## 2017-07-23 NOTE — Telephone Encounter (Signed)
Ok to fill 

## 2017-07-24 MED ORDER — LEVOTHYROXINE SODIUM 88 MCG PO TABS: ORAL_TABLET | ORAL | 1 refills | Status: DC

## 2017-07-24 NOTE — Telephone Encounter (Signed)
Called pt no answer LMOM refill has been sent to [facility]...[name]

## 2017-07-28 ENCOUNTER — Other Ambulatory Visit: Payer: Self-pay

## 2017-07-28 MED ORDER — ADALIMUMAB 40 MG/0.8ML ~~LOC~~ AJKT: 40.0000 mg | AUTO-INJECTOR | SUBCUTANEOUS | 3 refills | Status: DC

## 2017-08-08 ENCOUNTER — Ambulatory Visit
Admission: RE | Admit: 2017-08-08 | Discharge: 2017-08-08 | Disposition: A | Discharge status: Home / Self Care | Payer: 59 | Source: Ambulatory Visit | Attending: Oncology | Admitting: Oncology

## 2017-08-08 ENCOUNTER — Ambulatory Visit
Admission: RE | Admit: 2017-08-08 | Discharge: 2017-08-08 | Disposition: A | Discharge status: Home / Self Care | Payer: 59 | Source: Ambulatory Visit | Attending: Gastroenterology | Admitting: Gastroenterology

## 2017-08-08 DIAGNOSIS — Z1231 Encounter for screening mammogram for malignant neoplasm of breast: Secondary | ICD-10-CM | POA: Diagnosis not present

## 2017-08-08 DIAGNOSIS — K50119 Crohn's disease of large intestine with unspecified complications: Secondary | ICD-10-CM

## 2017-08-08 DIAGNOSIS — L980 Pyogenic granuloma: Secondary | ICD-10-CM | POA: Diagnosis not present

## 2017-08-08 DIAGNOSIS — M85851 Other specified disorders of bone density and structure, right thigh: Secondary | ICD-10-CM | POA: Diagnosis not present

## 2017-08-08 DIAGNOSIS — Z853 Personal history of malignant neoplasm of breast: Secondary | ICD-10-CM

## 2017-08-14 ENCOUNTER — Other Ambulatory Visit: Payer: Self-pay | Admitting: Family Medicine

## 2017-08-18 ENCOUNTER — Encounter: Payer: Self-pay | Admitting: Gastroenterology

## 2017-08-26 ENCOUNTER — Other Ambulatory Visit: Payer: Self-pay | Admitting: Emergency Medicine

## 2017-08-26 MED ORDER — ESCITALOPRAM OXALATE 10 MG PO TABS: 10.0000 mg | ORAL_TABLET | Freq: Every day | ORAL | 0 refills | Status: DC

## 2017-10-15 DIAGNOSIS — D485 Neoplasm of uncertain behavior of skin: Secondary | ICD-10-CM | POA: Diagnosis not present

## 2017-10-15 DIAGNOSIS — L980 Pyogenic granuloma: Secondary | ICD-10-CM | POA: Diagnosis not present

## 2017-11-29 ENCOUNTER — Other Ambulatory Visit: Payer: Self-pay | Admitting: Internal Medicine

## 2017-11-29 ENCOUNTER — Other Ambulatory Visit: Payer: Self-pay | Admitting: Gastroenterology

## 2017-12-01 ENCOUNTER — Telehealth: Payer: Self-pay

## 2017-12-01 DIAGNOSIS — K50119 Crohn's disease of large intestine with unspecified complications: Secondary | ICD-10-CM

## 2017-12-01 NOTE — Telephone Encounter (Signed)
Spoke with Dr Silverio Decamp. It is okay for this patient to see an extender for follow up as she is relatively stable. Do labs now and schedule the next available extender appointment. Lab orders are in.

## 2017-12-01 NOTE — Telephone Encounter (Signed)
Pt needs to schedule a follow up visit

## 2017-12-01 NOTE — Telephone Encounter (Signed)
Refilled Humira. The patient needs to be scheduled for the next available appointment. She is due labs and TB screening now. Schedule an appointment and put her on the cancellation list.

## 2017-12-08 ENCOUNTER — Telehealth: Payer: Self-pay | Admitting: Gastroenterology

## 2017-12-08 NOTE — Telephone Encounter (Signed)
Pt is requesting rf for humira sent to [facility]. She just made a yrl f/u appt with Dr. Silverio Decamp on 03/10/18.

## 2017-12-09 MED ORDER — ADALIMUMAB 40 MG/0.8ML ~~LOC~~ AJKT: 1.0000 | AUTO-INJECTOR | SUBCUTANEOUS | 0 refills | Status: DC

## 2017-12-09 NOTE — Telephone Encounter (Signed)
Letter to the patient advising she is past due for follow up with Dr Silverio Decamp. Also past due her labs and TB screening.

## 2017-12-09 NOTE — Telephone Encounter (Signed)
Humeria sent

## 2017-12-28 ENCOUNTER — Other Ambulatory Visit: Payer: Self-pay | Admitting: Internal Medicine

## 2018-01-01 ENCOUNTER — Other Ambulatory Visit: Payer: Self-pay

## 2018-01-01 MED ORDER — ADALIMUMAB 40 MG/0.8ML ~~LOC~~ AJKT: 1.0000 | AUTO-INJECTOR | SUBCUTANEOUS | 1 refills | Status: DC

## 2018-01-08 ENCOUNTER — Telehealth: Payer: Self-pay

## 2018-01-08 NOTE — Telephone Encounter (Signed)
PA for Humira sent in through "Cover My Meds" Key: Rosalio Macadamia

## 2018-01-08 NOTE — Telephone Encounter (Signed)
Humira approved until 01/09/2019  [id number]

## 2018-01-27 ENCOUNTER — Other Ambulatory Visit: Payer: Self-pay | Admitting: Internal Medicine

## 2018-02-02 ENCOUNTER — Other Ambulatory Visit: Payer: Self-pay | Admitting: Internal Medicine

## 2018-02-03 MED ORDER — ESCITALOPRAM OXALATE 10 MG PO TABS: 10.0000 mg | ORAL_TABLET | Freq: Every day | ORAL | 0 refills | Status: DC

## 2018-02-07 ENCOUNTER — Other Ambulatory Visit: Payer: Self-pay | Admitting: Internal Medicine

## 2018-03-05 ENCOUNTER — Other Ambulatory Visit: Payer: Self-pay | Admitting: Internal Medicine

## 2018-03-10 ENCOUNTER — Ambulatory Visit: Payer: 59 | Admitting: Gastroenterology

## 2018-03-10 ENCOUNTER — Other Ambulatory Visit (INDEPENDENT_AMBULATORY_CARE_PROVIDER_SITE_OTHER): Payer: 59

## 2018-03-10 ENCOUNTER — Encounter: Payer: Self-pay | Admitting: Gastroenterology

## 2018-03-10 ENCOUNTER — Other Ambulatory Visit: Payer: Self-pay | Admitting: Internal Medicine

## 2018-03-10 VITALS — BP 120/70 | HR 68 | Ht 66.0 in | Wt 153.6 lb

## 2018-03-10 DIAGNOSIS — K580 Irritable bowel syndrome with diarrhea: Secondary | ICD-10-CM | POA: Diagnosis not present

## 2018-03-10 DIAGNOSIS — K5010 Crohn's disease of large intestine without complications: Secondary | ICD-10-CM

## 2018-03-10 DIAGNOSIS — R739 Hyperglycemia, unspecified: Secondary | ICD-10-CM | POA: Insufficient documentation

## 2018-03-10 LAB — FOLATE

## 2018-03-10 LAB — VITAMIN B12: Vitamin B-12: 986 pg/mL — ABNORMAL HIGH (ref 211–911)

## 2018-03-10 LAB — HIGH SENSITIVITY CRP: CRP, High Sensitivity: 0.28 mg/L (ref 0.000–5.000)

## 2018-03-10 LAB — FERRITIN: Ferritin: 73.2 ng/mL (ref 10.0–291.0)

## 2018-03-10 NOTE — Patient Instructions (Addendum)
Test(s) ordered today. Your results will be released to MyChart (or called to you) after review, usually within 72hours after test completion. If any changes need to be made, you will be notified at that same time.  All other Health Maintenance issues reviewed.   All recommended immunizations and age-appropriate screenings are up-to-date or discussed.  No immunizations administered today.   Medications reviewed and updated.  No changes recommended at this time.    Please followup in one year   Health Maintenance, Female Adopting a healthy lifestyle and getting preventive care can go a long way to promote health and wellness. Talk with your health care provider about what schedule of regular examinations is right for you. This is a good chance for you to check in with your provider about disease prevention and staying healthy. In between checkups, there are plenty of things you can do on your own. Experts have done a lot of research about which lifestyle changes and preventive measures are most likely to keep you healthy. Ask your health care provider for more information. Weight and diet Eat a healthy diet  Be sure to include plenty of vegetables, fruits, low-fat dairy products, and lean protein.  Do not eat a lot of foods high in solid fats, added sugars, or salt.  Get regular exercise. This is one of the most important things you can do for your health. ? Most adults should exercise for at least 150 minutes each week. The exercise should increase your heart rate and make you sweat (moderate-intensity exercise). ? Most adults should also do strengthening exercises at least twice a week. This is in addition to the moderate-intensity exercise.  Maintain a healthy weight  Body mass index (BMI) is a measurement that can be used to identify possible weight problems. It estimates body fat based on height and weight. Your health care provider can help determine your BMI and help you achieve  or maintain a healthy weight.  For females 20 years of age and older: ? A BMI below 18.5 is considered underweight. ? A BMI of 18.5 to 24.9 is normal. ? A BMI of 25 to 29.9 is considered overweight. ? A BMI of 30 and above is considered obese.  Watch levels of cholesterol and blood lipids  You should start having your blood tested for lipids and cholesterol at 53 years of age, then have this test every 5 years.  You may need to have your cholesterol levels checked more often if: ? Your lipid or cholesterol levels are high. ? You are older than 53 years of age. ? You are at high risk for heart disease.  Cancer screening Lung Cancer  Lung cancer screening is recommended for adults 55-80 years old who are at high risk for lung cancer because of a history of smoking.  A yearly low-dose CT scan of the lungs is recommended for people who: ? Currently smoke. ? Have quit within the past 15 years. ? Have at least a 30-pack-year history of smoking. A pack year is smoking an average of one pack of cigarettes a day for 1 year.  Yearly screening should continue until it has been 15 years since you quit.  Yearly screening should stop if you develop a health problem that would prevent you from having lung cancer treatment.  Breast Cancer  Practice breast self-awareness. This means understanding how your breasts normally appear and feel.  It also means doing regular breast self-exams. Let your health care provider know about any   no matter how small.  If you are in your 20s or 30s, you should have a clinical breast exam (CBE) by a health care provider every 1-3 years as part of a regular health exam.  If you are 40 or older, have a CBE every year. Also consider having a breast X-ray (mammogram) every year.  If you have a family history of breast cancer, talk to your health care provider about genetic screening.  If you are at high risk for breast cancer, talk to your health care  provider about having an MRI and a mammogram every year.  Breast cancer gene (BRCA) assessment is recommended for women who have family members with BRCA-related cancers. BRCA-related cancers include: ? Breast. ? Ovarian. ? Tubal. ? Peritoneal cancers.  Results of the assessment will determine the need for genetic counseling and BRCA1 and BRCA2 testing.  Cervical Cancer Your health care provider may recommend that you be screened regularly for cancer of the pelvic organs (ovaries, uterus, and vagina). This screening involves a pelvic examination, including checking for microscopic changes to the surface of your cervix (Pap test). You may be encouraged to have this screening done every 3 years, beginning at age 21.  For women ages 30-65, health care providers may recommend pelvic exams and Pap testing every 3 years, or they may recommend the Pap and pelvic exam, combined with testing for human papilloma virus (HPV), every 5 years. Some types of HPV increase your risk of cervical cancer. Testing for HPV may also be done on women of any age with unclear Pap test results.  Other health care providers may not recommend any screening for nonpregnant women who are considered low risk for pelvic cancer and who do not have symptoms. Ask your health care provider if a screening pelvic exam is right for you.  If you have had past treatment for cervical cancer or a condition that could lead to cancer, you need Pap tests and screening for cancer for at least 20 years after your treatment. If Pap tests have been discontinued, your risk factors (such as having a new sexual partner) need to be reassessed to determine if screening should resume. Some women have medical problems that increase the chance of getting cervical cancer. In these cases, your health care provider may recommend more frequent screening and Pap tests.  Colorectal Cancer  This type of cancer can be detected and often prevented.  Routine  colorectal cancer screening usually begins at 53 years of age and continues through 53 years of age.  Your health care provider may recommend screening at an earlier age if you have risk factors for colon cancer.  Your health care provider may also recommend using home test kits to check for hidden blood in the stool.  A small camera at the end of a tube can be used to examine your colon directly (sigmoidoscopy or colonoscopy). This is done to check for the earliest forms of colorectal cancer.  Routine screening usually begins at age 50.  Direct examination of the colon should be repeated every 5-10 years through 53 years of age. However, you may need to be screened more often if early forms of precancerous polyps or small growths are found.  Skin Cancer  Check your skin from head to toe regularly.  Tell your health care provider about any new moles or changes in moles, especially if there is a change in a mole's shape or color.  Also tell your health care provider if you   have a mole that is larger than the size of a pencil eraser.  Always use sunscreen. Apply sunscreen liberally and repeatedly throughout the day.  Protect yourself by wearing long sleeves, pants, a wide-brimmed hat, and sunglasses whenever you are outside.  Heart disease, diabetes, and high blood pressure  High blood pressure causes heart disease and increases the risk of stroke. High blood pressure is more likely to develop in: ? People who have blood pressure in the high end of the normal range (130-139/85-89 mm Hg). ? People who are overweight or obese. ? People who are African American.  If you are 24-25 years of age, have your blood pressure checked every 3-5 years. If you are 2 years of age or older, have your blood pressure checked every year. You should have your blood pressure measured twice-once when you are at a hospital or clinic, and once when you are not at a hospital or clinic. Record the average of the  two measurements. To check your blood pressure when you are not at a hospital or clinic, you can use: ? An automated blood pressure machine at a pharmacy. ? A home blood pressure monitor.  If you are between 42 years and 59 years old, ask your health care provider if you should take aspirin to prevent strokes.  Have regular diabetes screenings. This involves taking a blood sample to check your fasting blood sugar level. ? If you are at a normal weight and have a low risk for diabetes, have this test once every three years after 53 years of age. ? If you are overweight and have a high risk for diabetes, consider being tested at a younger age or more often. Preventing infection Hepatitis B  If you have a higher risk for hepatitis B, you should be screened for this virus. You are considered at high risk for hepatitis B if: ? You were born in a country where hepatitis B is common. Ask your health care provider which countries are considered high risk. ? Your parents were born in a high-risk country, and you have not been immunized against hepatitis B (hepatitis B vaccine). ? You have HIV or AIDS. ? You use needles to inject street drugs. ? You live with someone who has hepatitis B. ? You have had sex with someone who has hepatitis B. ? You get hemodialysis treatment. ? You take certain medicines for conditions, including cancer, organ transplantation, and autoimmune conditions.  Hepatitis C  Blood testing is recommended for: ? Everyone born from [birth year] through 1966. ? Anyone with known risk factors for hepatitis C.  Sexually transmitted infections (STIs)  You should be screened for sexually transmitted infections (STIs) including gonorrhea and chlamydia if: ? You are sexually active and are younger than 53 years of age. ? You are older than 53 years of age and your health care provider tells you that you are at risk for this type of infection. ? Your sexual activity has changed since you  were last screened and you are at an increased risk for chlamydia or gonorrhea. Ask your health care provider if you are at risk.  If you do not have HIV, but are at risk, it may be recommended that you take a prescription medicine daily to prevent HIV infection. This is called pre-exposure prophylaxis (PrEP). You are considered at risk if: ? You are sexually active and do not regularly use condoms or know the HIV status of your partner(s). ? You take drugs by injection. ?  You are sexually active with a partner who has HIV.  Talk with your health care provider about whether you are at high risk of being infected with HIV. If you choose to begin PrEP, you should first be tested for HIV. You should then be tested every 3 months for as long as you are taking PrEP. Pregnancy  If you are premenopausal and you may become pregnant, ask your health care provider about preconception counseling.  If you may become pregnant, take 400 to 800 micrograms (mcg) of folic acid every day.  If you want to prevent pregnancy, talk to your health care provider about birth control (contraception). Osteoporosis and menopause  Osteoporosis is a disease in which the bones lose minerals and strength with aging. This can result in serious bone fractures. Your risk for osteoporosis can be identified using a bone density scan.  If you are 65 years of age or older, or if you are at risk for osteoporosis and fractures, ask your health care provider if you should be screened.  Ask your health care provider whether you should take a calcium or vitamin D supplement to lower your risk for osteoporosis.  Menopause may have certain physical symptoms and risks.  Hormone replacement therapy may reduce some of these symptoms and risks. Talk to your health care provider about whether hormone replacement therapy is right for you. Follow these instructions at home:  Schedule regular health, dental, and eye exams.  Stay current  with your immunizations.  Do not use any tobacco products including cigarettes, chewing tobacco, or electronic cigarettes.  If you are pregnant, do not drink alcohol.  If you are breastfeeding, limit how much and how often you drink alcohol.  Limit alcohol intake to no more than 1 drink per day for nonpregnant women. One drink equals 12 ounces of beer, 5 ounces of wine, or 1 ounces of hard liquor.  Do not use street drugs.  Do not share needles.  Ask your health care provider for help if you need support or information about quitting drugs.  Tell your health care provider if you often feel depressed.  Tell your health care provider if you have ever been abused or do not feel safe at home. This information is not intended to replace advice given to you by your health care provider. Make sure you discuss any questions you have with your health care provider. Document Released: 02/11/2011 Document Revised: 01/04/2016 Document Reviewed: 05/02/2015 Elsevier Interactive Patient Education  2018 Elsevier Inc.  

## 2018-03-10 NOTE — Progress Notes (Signed)
Subjective:    Patient ID: [name], female    DOB: 06/08/1966, 52 y.o.   MRN: [id number]  HPI She is here for a physical exam.   Pain in upper left and neck on left side.  She woke up with pain this morning and feels she pulled something when she was sleeping.  She has mild pain in the left arm, but it is not constant.  She denies numbness/tingling.  She has not taken anything for the pain yet.      She has chronic , intermittent upper neck pain at the base of her skull and it triggers headaches.  The headaches can be severe and she has used narcotics in the past and it helped.   Now she uses ice and has to lay down.  She has been to the headache center, but that was years ago.  It can take hours to go away.  It will make her nauseous.  She has had increased headaches lately - they can occur twice a month.   Fatigue upon wakening - hard to get out of bed.  Feels tired during day in summer only - not in cooler weather.    Medications and allergies reviewed with patient and updated if appropriate.  Patient Active Problem List   Diagnosis Date Noted  . Hyperglycemia 03/10/2018  . History of breast cancer 01/24/2017  . Overactive bladder 01/24/2017  . PVC (premature ventricular contraction) 07/15/2016  . Insomnia 01/11/2014  . Anxiety associated with depression 11/09/2013  . Crohn's colitis ([place]) 11/09/2012  . Osteopenia 10/29/2012  . Hypothyroidism 04/22/2012  . GOITER, MULTINODULAR 07/14/2007  . INCONTINENCE, URGE 10/09/2006    Current Outpatient Medications on File Prior to Visit  Medication Sig Dispense Refill  . Adalimumab (HUMIRA PEN) 40 MG/0.8ML PNKT Inject 1 each into the muscle every 14 (fourteen) days. 2 each 1  . aspirin 81 MG tablet Take 81 mg by mouth daily as needed.     Marland Kitchen b complex vitamins capsule Take 1 capsule by mouth at bedtime.     . Biotin 5000 MCG CAPS Take 5,000 mcg by mouth 2 (two) times daily.     . Calcium Carb-Cholecalciferol (CALCIUM + D3 PO)  Take 1 tablet by mouth at bedtime.     Marland Kitchen desonide (DESOWEN) 0.05 % cream APPLY TOPICALLY TWICE DAILY (Patient taking differently: APPLY TOPICALLY TWICE DAILY as needed) 2 g 0  . escitalopram (LEXAPRO) 10 MG tablet TAKE ONE TABLET BY MOUTH DAILY 30 tablet 0  . fluticasone (FLONASE) 50 MCG/ACT nasal spray Place 2 sprays into both nostrils daily. In each nostril as needed (Patient taking differently: Place 2 sprays into both nostrils daily as needed for allergies. ) 16 g 2  . ibuprofen (ADVIL,MOTRIN) 200 MG tablet Take 600 mg by mouth every 6 (six) hours as needed for moderate pain.     Marland Kitchen levothyroxine (SYNTHROID, LEVOTHROID) 88 MCG tablet TAKE ONE TABLET BY MOUJTH DAILY BEFORE BREAKFAST 30 tablet 0  . Multiple Vitamin (MULTIVITAMIN) tablet Take 1 tablet by mouth daily.    . PSYLLIUM HUSK PO Take 8 tablets by mouth daily.    Marland Kitchen tolterodine (DETROL LA) 4 MG 24 hr capsule Take 4 mg by mouth daily.    . Vaginal Lubricant (REPLENS) GEL USE AS DIRECTED (Patient taking differently: One application Daily as needed for dryness) 35 g 0  . VITAMIN E PO Take 1 capsule by mouth daily.      No current facility-administered medications on  file prior to visit.     Past Medical History:  Diagnosis Date  . Anxiety   . Asthma   . Chronic headaches   . Granulomatous colitis ([place])   . History of bone density study    no osteopenia  . History of MRI of cervical spine 4/03   mild DJD  . HX: breast cancer 11/98 and 12/03   recurrance  . Hypothyroidism   . IBS (irritable bowel syndrome)   . Inguinal hernia     Past Surgical History:  Procedure Laterality Date  . INGUINAL HERNIA REPAIR Right   . MASTECTOMY Right   . TOTAL ABDOMINAL HYSTERECTOMY W/ BILATERAL SALPINGOOPHORECTOMY  2007   left cervix in place    Social History   Socioeconomic History  . Marital status: Single    Spouse name: Not on file  . Number of children: 0  . Years of education: Not on file  . Highest education level: Not on file    Occupational History  . Occupation: managed care    Employer: [employer]  . Financial resource strain: Not on file  . Food insecurity:    Worry: Not on file    Inability: Not on file  . Transportation needs:    Medical: Not on file    Non-medical: Not on file  Tobacco Use  . Smoking status: Never Smoker  . Smokeless tobacco: Never Used  Substance and Sexual Activity  . Alcohol use: Yes    Comment: occas  . Drug use: No  . Sexual activity: Not on file  Lifestyle  . Physical activity:    Days per week: Not on file    Minutes per session: Not on file  . Stress: Not on file  Relationships  . Social connections:    Talks on phone: Not on file    Gets together: Not on file    Attends religious service: Not on file    Active member of club or organization: Not on file    Attends meetings of clubs or organizations: Not on file    Relationship status: Not on file  Other Topics Concern  . Not on file  Social History Narrative   Exercise; runs    Family History  Problem Relation Age of Onset  . Hypertension Mother   . Colon polyps Mother   . Irritable bowel syndrome Mother   . Skin cancer Father   . Breast cancer Paternal Aunt   . Colon polyps Brother   . Psoriasis Brother   . Dermatomyositis Brother   . Diabetes Neg Hx   . Heart disease Neg Hx   . Stroke Neg Hx     Review of Systems  Constitutional: Positive for fatigue (not improved with sleep) and fever (low grade - crohn's). Negative for chills.  Eyes: Negative for visual disturbance.  Respiratory: Positive for shortness of breath (with stairs only). Negative for cough and wheezing.   Cardiovascular: Positive for palpitations (PVCs). Negative for chest pain and leg swelling.  Gastrointestinal: Positive for abdominal distention, abdominal pain and nausea. Negative for blood in stool, constipation and diarrhea.  Genitourinary: Negative for dysuria and hematuria.  Musculoskeletal: Positive for back  pain and neck pain. Negative for arthralgias.  Skin: Negative for color change and rash.  Neurological: Positive for light-headedness (positional), numbness (with migraine - upper lip) and headaches (related to neck primarily).  Psychiatric/Behavioral: Positive for dysphoric mood (controlled). The patient is nervous/anxious (controlled).  Objective:   Vitals:   03/11/18 1053  BP: 116/80  Pulse: 69  SpO2: 94%   Filed Weights   03/11/18 1053  Weight: 153 lb (69.4 kg)   Body mass index is 24.69 kg/m.  Wt Readings from Last 3 Encounters:  03/11/18 153 lb (69.4 kg)  03/10/18 153 lb 9.6 oz (69.7 kg)  06/27/17 150 lb (68 kg)     Physical Exam Constitutional: She appears well-developed and well-nourished. No distress.  HENT:  Head: Normocephalic and atraumatic.  Right Ear: External ear normal. Normal ear canal and TM Left Ear: External ear normal.  Normal ear canal and TM Mouth/Throat: Oropharynx is clear and moist.  Eyes: Conjunctivae and EOM are normal.  Neck: Neck supple. No tracheal deviation present. No thyromegaly present.  No carotid bruit  Cardiovascular: Normal rate, regular rhythm and normal heart sounds.   No murmur heard.  No edema. Pulmonary/Chest: Effort normal and breath sounds normal. No respiratory distress. She has no wheezes. She has no rales.  Breast: deferred  Abdominal: Soft. Surgical umbilicus - w/o tenderness or bulge.  She exhibits no distension. There is no tenderness.  Msk: pain in left upper back, no tenderness base of skull Lymphadenopathy: She has no cervical adenopathy.  Skin: Skin is warm and dry. She is not diaphoretic.  Psychiatric: She has a normal mood and affect. Her behavior is normal.        Assessment & Plan:   Physical exam: Screening blood work   ordered Immunizations  Discussed shingles - on humira so needs to discuss with GI , others up to date Colonoscopy  Up to date  Mammogram    Up to date  Gyn - no longer sees  gyn Eye exams  Up to date  EKG    Done 04/2016 Exercise - runs and walks regularly Weight   Normal BMI Skin no concerns, has seen derm Substance abuse  none  See Problem List for Assessment and Plan of chronic medical problems.  FU in one year

## 2018-03-10 NOTE — Patient Instructions (Signed)
Go to the basement for labs today  Take IBGard 1 capsule three times a day as needed  If you are age 53 or older, your body mass index should be between 23-30. Your Body mass index is 24.79 kg/m. If this is out of the aforementioned range listed, please consider follow up with your Primary Care Provider.  If you are age 81 or younger, your body mass index should be between 19-25. Your Body mass index is 24.79 kg/m. If this is out of the aformentioned range listed, please consider follow up with your Primary Care Provider.    Thank you for choosing [facility]  Karleen Hampshire Nandigam,MD

## 2018-03-10 NOTE — Progress Notes (Signed)
[name]    [id number]    [birth year]  Primary Care Physician:Burns, Claudina Lick, MD  Referring Physician: Binnie Rail, MD [place], [place] [address]  Chief complaint: Crohn's colitis  HPI: 52 year old female with history of Crohn's colitis diagnosed in 2013 in clinical remission on Humira 40 mg every 2 weeks here for follow-up visit.  She is doing well overall with stable GI symptoms.  2 to 3 days before she is due to take the Humira injection she feels tired, generalized abdominal bloating and occasional abdominal cramps. She has intermittent episodes of diarrhea depending on her diet.  Denies any dark stool or blood in stool.  No loss of appetite or weight loss.    Outpatient Encounter Medications as of 03/10/2018  Medication Sig  . Adalimumab (HUMIRA PEN) 40 MG/0.8ML PNKT Inject 1 each into the muscle every 14 (fourteen) days.  Marland Kitchen aspirin 81 MG tablet Take 81 mg by mouth daily as needed.   Marland Kitchen b complex vitamins capsule Take 1 capsule by mouth at bedtime.   . Biotin 5000 MCG CAPS Take 5,000 mcg by mouth 2 (two) times daily.   . Calcium Carb-Cholecalciferol (CALCIUM + D3 PO) Take 1 tablet by mouth at bedtime.   Marland Kitchen desonide (DESOWEN) 0.05 % cream APPLY TOPICALLY TWICE DAILY (Patient taking differently: APPLY TOPICALLY TWICE DAILY as needed)  . escitalopram (LEXAPRO) 10 MG tablet TAKE ONE TABLET BY MOUTH DAILY  . fluticasone (FLONASE) 50 MCG/ACT nasal spray Place 2 sprays into both nostrils daily. In each nostril as needed (Patient taking differently: Place 2 sprays into both nostrils daily as needed for allergies. )  . ibuprofen (ADVIL,MOTRIN) 200 MG tablet Take 600 mg by mouth every 6 (six) hours as needed for moderate pain.   Marland Kitchen levothyroxine (SYNTHROID, LEVOTHROID) 88 MCG tablet TAKE ONE TABLET BY MOUJTH DAILY BEFORE BREAKFAST  . Multiple Vitamin (MULTIVITAMIN) tablet Take 1 tablet by mouth daily.  . PSYLLIUM HUSK PO Take 8 tablets by mouth daily.  Marland Kitchen  tolterodine (DETROL LA) 4 MG 24 hr capsule Take 4 mg by mouth daily.  . Vaginal Lubricant (REPLENS) GEL USE AS DIRECTED (Patient taking differently: One application Daily as needed for dryness)  . VITAMIN E PO Take 1 capsule by mouth daily.   . [DISCONTINUED] HYDROcodone-acetaminophen (NORCO/VICODIN) 5-325 MG tablet Take 1 tablet every 6 (six) hours as needed by mouth for severe pain.  . [DISCONTINUED] hydrOXYzine (ATARAX/VISTARIL) 25 MG tablet Take 1 tablet (25 mg total) by mouth every 6 (six) hours.  . [DISCONTINUED] predniSONE (STERAPRED UNI-PAK 21 TAB) 10 MG (21) TBPK tablet Take 6 tabs day 1, 5 tabs day 2, 4 tabs day 3, 3 tabs day 4, 2 tabs day 5, and 1 on day 6  . [DISCONTINUED] tolterodine (DETROL LA) 4 MG 24 hr capsule Take 1 capsule (4 mg total) by mouth daily.  . [DISCONTINUED] tolterodine (DETROL LA) 4 MG 24 hr capsule Take 1 capsule (4 mg total) by mouth daily as needed. (Patient taking differently: Take 4 mg by mouth daily as needed (incontinence). )   No facility-administered encounter medications on file as of 03/10/2018.     Allergies as of 03/10/2018 - Review Complete 03/10/2018  Allergen Reaction Noted  . Sulfa antibiotics Rash 09/16/2011    Past Medical History:  Diagnosis Date  . Anxiety   . Asthma   . Chronic headaches   . Granulomatous colitis ([place])   . History  of bone density study    no osteopenia  . History of MRI of cervical spine 4/03   mild DJD  . HX: breast cancer 11/98 and 12/03   recurrance  . Hypothyroidism   . IBS (irritable bowel syndrome)   . Inguinal hernia     Past Surgical History:  Procedure Laterality Date  . INGUINAL HERNIA REPAIR Right   . MASTECTOMY Right   . TOTAL ABDOMINAL HYSTERECTOMY W/ BILATERAL SALPINGOOPHORECTOMY  2007   left cervix in place    Family History  Problem Relation Age of Onset  . Hypertension Mother   . Colon polyps Mother   . Irritable bowel syndrome Mother   . Skin cancer Father   . Breast cancer Paternal  Aunt   . Colon polyps Brother   . Psoriasis Brother   . Dermatomyositis Brother   . Diabetes Neg Hx   . Heart disease Neg Hx   . Stroke Neg Hx     Social History   Socioeconomic History  . Marital status: Single    Spouse name: Not on file  . Number of children: 0  . Years of education: Not on file  . Highest education level: Not on file  Occupational History  . Occupation: managed care    Employer: [employer]  . Financial resource strain: Not on file  . Food insecurity:    Worry: Not on file    Inability: Not on file  . Transportation needs:    Medical: Not on file    Non-medical: Not on file  Tobacco Use  . Smoking status: Never Smoker  . Smokeless tobacco: Never Used  Substance and Sexual Activity  . Alcohol use: Yes    Comment: occas  . Drug use: No  . Sexual activity: Not on file  Lifestyle  . Physical activity:    Days per week: Not on file    Minutes per session: Not on file  . Stress: Not on file  Relationships  . Social connections:    Talks on phone: Not on file    Gets together: Not on file    Attends religious service: Not on file    Active member of club or organization: Not on file    Attends meetings of clubs or organizations: Not on file    Relationship status: Not on file  . Intimate partner violence:    Fear of current or ex partner: Not on file    Emotionally abused: Not on file    Physically abused: Not on file    Forced sexual activity: Not on file  Other Topics Concern  . Not on file  Social History Narrative   Exercise; runs      Review of systems: Review of Systems  Constitutional: Negative for fever and chills.  HENT: Negative.   Eyes: Negative for blurred vision.  Respiratory: Negative for cough, shortness of breath and wheezing.   Cardiovascular: Negative for chest pain and palpitations.  Gastrointestinal: as per HPI Genitourinary: Negative for dysuria, urgency, frequency and hematuria.  Musculoskeletal:  Negative for myalgias, back pain and joint pain.  Skin: Negative for itching and rash.  Neurological: Negative for dizziness, tremors, focal weakness, seizures and loss of consciousness.  Endo/Heme/Allergies: Positive for seasonal allergies.  Psychiatric/Behavioral: Negative for depression, suicidal ideas and hallucinations.  All other systems reviewed and are negative.   Physical Exam: Vitals:   03/10/18 1440  BP: 120/70  Pulse: 68   Body mass index is  24.79 kg/m. Gen:      No acute distress HEENT:  EOMI, sclera anicteric Lungs:    Clear to auscultation bilaterally; normal respiratory effort CV:         Regular rate and rhythm; no murmurs Abd:      + bowel sounds; soft, non-tender; no palpable masses, no distension Ext:    No edema; adequate peripheral perfusion Neuro: alert and oriented x 3 Psych: normal mood and affect  Data Reviewed:  Reviewed labs, radiology imaging, old records and pertinent past GI work up   Assessment and Plan/Recommendations:  52 year old female with history of Crohn's colitis diagnosed in 2013, currently in clinical remission on Humira 40 mg every 2 weeks Colonoscopy with random colon and ileal biopsies July 2018: negative for active inflammation.  Due for surveillance colonoscopy July 2023 Bone density scan August 08, 2017, osteopenic recommended vitamin D and calcium.  Repeat bone density scan in 2 years TB QuantiFERON -April 2019 Follow-up CRP, ferritin, Z98 and folic acid  Irritable bowel syndrome: Trial of IBgard 1 capsule 3 times daily Advised patient to avoid excessive fiber intake Trial of lactose-free diet  Return in 6 months or sooner if needed   K. Denzil Magnuson , MD [phone]    CC: Binnie Rail, MD

## 2018-03-11 ENCOUNTER — Ambulatory Visit (INDEPENDENT_AMBULATORY_CARE_PROVIDER_SITE_OTHER): Payer: 59 | Admitting: Internal Medicine

## 2018-03-11 ENCOUNTER — Encounter: Payer: Self-pay | Admitting: Internal Medicine

## 2018-03-11 ENCOUNTER — Encounter: Payer: Self-pay | Admitting: Gastroenterology

## 2018-03-11 ENCOUNTER — Other Ambulatory Visit (INDEPENDENT_AMBULATORY_CARE_PROVIDER_SITE_OTHER): Payer: 59

## 2018-03-11 VITALS — BP 116/80 | HR 69 | Ht 66.0 in | Wt 153.0 lb

## 2018-03-11 DIAGNOSIS — G4486 Cervicogenic headache: Secondary | ICD-10-CM | POA: Insufficient documentation

## 2018-03-11 DIAGNOSIS — M8580 Other specified disorders of bone density and structure, unspecified site: Secondary | ICD-10-CM | POA: Diagnosis not present

## 2018-03-11 DIAGNOSIS — I493 Ventricular premature depolarization: Secondary | ICD-10-CM

## 2018-03-11 DIAGNOSIS — R51 Headache: Secondary | ICD-10-CM

## 2018-03-11 DIAGNOSIS — E038 Other specified hypothyroidism: Secondary | ICD-10-CM | POA: Diagnosis not present

## 2018-03-11 DIAGNOSIS — R739 Hyperglycemia, unspecified: Secondary | ICD-10-CM | POA: Diagnosis not present

## 2018-03-11 DIAGNOSIS — Z0000 Encounter for general adult medical examination without abnormal findings: Secondary | ICD-10-CM

## 2018-03-11 DIAGNOSIS — S46812A Strain of other muscles, fascia and tendons at shoulder and upper arm level, left arm, initial encounter: Secondary | ICD-10-CM | POA: Insufficient documentation

## 2018-03-11 DIAGNOSIS — F418 Other specified anxiety disorders: Secondary | ICD-10-CM | POA: Diagnosis not present

## 2018-03-11 LAB — CBC WITH DIFFERENTIAL/PLATELET
BASOS PCT: 1 % (ref 0.0–3.0)
Basophils Absolute: 0 10*3/uL (ref 0.0–0.1)
Eosinophils Absolute: 0.3 10*3/uL (ref 0.0–0.7)
Eosinophils Relative: 6.7 % — ABNORMAL HIGH (ref 0.0–5.0)
HCT: 41.3 % (ref 36.0–46.0)
HEMOGLOBIN: 13.6 g/dL (ref 12.0–15.0)
LYMPHS ABS: 1.7 10*3/uL (ref 0.7–4.0)
Lymphocytes Relative: 37.8 % (ref 12.0–46.0)
MCHC: 33 g/dL (ref 30.0–36.0)
MCV: 83.6 fl (ref 78.0–100.0)
MONO ABS: 0.4 10*3/uL (ref 0.1–1.0)
MONOS PCT: 9.5 % (ref 3.0–12.0)
NEUTROS PCT: 45 % (ref 43.0–77.0)
Neutro Abs: 2 10*3/uL (ref 1.4–7.7)
Platelets: 209 10*3/uL (ref 150.0–400.0)
RBC: 4.93 Mil/uL (ref 3.87–5.11)
RDW: 13 % (ref 11.5–15.5)
WBC: 4.4 10*3/uL (ref 4.0–10.5)

## 2018-03-11 LAB — COMPREHENSIVE METABOLIC PANEL
ALBUMIN: 4.5 g/dL (ref 3.5–5.2)
ALK PHOS: 63 U/L (ref 39–117)
ALT: 15 U/L (ref 0–35)
AST: 18 U/L (ref 0–37)
BUN: 16 mg/dL (ref 6–23)
CHLORIDE: 103 meq/L (ref 96–112)
CO2: 34 mEq/L — ABNORMAL HIGH (ref 19–32)
Calcium: 9.7 mg/dL (ref 8.4–10.5)
Creatinine, Ser: 0.73 mg/dL (ref 0.40–1.20)
GFR: 88.72 mL/min (ref >60.00)
Glucose, Bld: 98 mg/dL (ref 70–99)
Potassium: 4.1 mEq/L (ref 3.5–5.1)
SODIUM: 141 meq/L (ref 135–145)
TOTAL PROTEIN: 7.1 g/dL (ref 6.0–8.3)
Total Bilirubin: 0.5 mg/dL (ref 0.2–1.2)

## 2018-03-11 LAB — LIPID PANEL
CHOLESTEROL: 214 mg/dL — AB (ref 0–200)
HDL: 71.8 mg/dL (ref >39.00)
LDL Cholesterol: 130 mg/dL — ABNORMAL HIGH (ref 0–99)
NonHDL: 142.52
Total CHOL/HDL Ratio: 3
Triglycerides: 63 mg/dL (ref 0.0–149.0)
VLDL: 12.6 mg/dL (ref 0.0–40.0)

## 2018-03-11 LAB — TSH: TSH: 0.77 u[IU]/mL (ref 0.35–4.50)

## 2018-03-11 LAB — HEMOGLOBIN A1C: Hgb A1c MFr Bld: 6 % (ref 4.6–6.5)

## 2018-03-11 NOTE — Assessment & Plan Note (Signed)
Acute - started this morning - likely slept wrong Deferred muscle relaxer Symptomatic treatment discussed

## 2018-03-11 NOTE — Assessment & Plan Note (Signed)
a1c

## 2018-03-11 NOTE — Assessment & Plan Note (Signed)
Controlled, stable Continue current dose of medication  

## 2018-03-11 NOTE — Assessment & Plan Note (Signed)
Occasional palps Sees cardio

## 2018-03-11 NOTE — Assessment & Plan Note (Signed)
Check tsh  Titrate med dose if needed  

## 2018-03-11 NOTE — Assessment & Plan Note (Signed)
Has headaches about 2/ month - some can be severe with nausea Deferred PT, muscle relaxer Consider referral to the headache clinic  Continue regular neck stretches, massages

## 2018-03-14 ENCOUNTER — Encounter: Payer: Self-pay | Admitting: Internal Medicine

## 2018-03-14 ENCOUNTER — Other Ambulatory Visit: Payer: Self-pay | Admitting: Gastroenterology

## 2018-03-14 DIAGNOSIS — R7303 Prediabetes: Secondary | ICD-10-CM | POA: Insufficient documentation

## 2018-04-05 ENCOUNTER — Other Ambulatory Visit: Payer: Self-pay | Admitting: Internal Medicine

## 2018-04-22 ENCOUNTER — Other Ambulatory Visit: Payer: Self-pay | Admitting: Gastroenterology

## 2018-04-27 ENCOUNTER — Encounter: Payer: Self-pay | Admitting: Internal Medicine

## 2018-05-19 ENCOUNTER — Other Ambulatory Visit: Payer: Self-pay

## 2018-05-19 MED ORDER — ESCITALOPRAM OXALATE 10 MG PO TABS: 10.0000 mg | ORAL_TABLET | Freq: Every day | ORAL | 1 refills | Status: DC

## 2018-05-19 MED ORDER — LEVOTHYROXINE SODIUM 88 MCG PO TABS: ORAL_TABLET | ORAL | 1 refills | Status: DC

## 2018-07-15 ENCOUNTER — Other Ambulatory Visit: Payer: Self-pay | Admitting: Gastroenterology

## 2018-07-20 ENCOUNTER — Other Ambulatory Visit: Payer: Self-pay | Admitting: Oncology

## 2018-07-20 DIAGNOSIS — Z1231 Encounter for screening mammogram for malignant neoplasm of breast: Secondary | ICD-10-CM

## 2018-08-26 ENCOUNTER — Ambulatory Visit
Admission: RE | Admit: 2018-08-26 | Discharge: 2018-08-26 | Disposition: A | Discharge status: Home / Self Care | Payer: BLUE CROSS/BLUE SHIELD | Source: Ambulatory Visit | Attending: Oncology | Admitting: Oncology

## 2018-08-26 DIAGNOSIS — Z1231 Encounter for screening mammogram for malignant neoplasm of breast: Secondary | ICD-10-CM | POA: Diagnosis not present

## 2018-08-26 HISTORY — DX: Personal history of irradiation: Z92.3

## 2018-08-26 HISTORY — DX: Personal history of antineoplastic chemotherapy: Z92.21

## 2018-08-27 ENCOUNTER — Other Ambulatory Visit: Payer: Self-pay | Admitting: Gastroenterology

## 2018-08-31 ENCOUNTER — Telehealth: Payer: Self-pay | Admitting: *Deleted

## 2018-08-31 NOTE — Telephone Encounter (Signed)
-----   Message from Ladell Pier, MD sent at 08/26/2018  7:34 PM EST ----- Please call her. Does she plan to f/u here or with someone else? ----- Message ----- From: Interface, Rad Results In Sent: 08/26/2018   4:51 PM EST To: Ladell Pier, MD

## 2018-08-31 NOTE — Telephone Encounter (Signed)
Notified patient of normal mammogram report. Inquired about her follow up and she does want to continue to see Dr. Benay Spice. Agrees to come in in the next 1-2 months. Scheduling message sent

## 2018-09-01 ENCOUNTER — Telehealth: Payer: Self-pay | Admitting: Oncology

## 2018-09-01 NOTE — Telephone Encounter (Signed)
Scheduled appt per 1/20 sch message - pt is aware of appt date and time

## 2018-09-04 ENCOUNTER — Other Ambulatory Visit: Payer: Self-pay

## 2018-09-04 ENCOUNTER — Telehealth: Payer: Self-pay | Admitting: Gastroenterology

## 2018-09-04 DIAGNOSIS — K5010 Crohn's disease of large intestine without complications: Secondary | ICD-10-CM

## 2018-09-04 MED ORDER — ADALIMUMAB 40 MG/0.4ML ~~LOC~~ AJKT: 40.0000 mg | AUTO-INJECTOR | SUBCUTANEOUS | 6 refills | Status: DC

## 2018-09-04 NOTE — Telephone Encounter (Signed)
Spoke with the patient. She has new insurance. Forms will be faxed for the prior authorization. She will put this to the attention of Beth. She does not have a follow up appointment. Declines to schedule because she did not think she was due. When would you want her to return for labs or an office visit? Last OV was in July 2019.

## 2018-09-04 NOTE — Telephone Encounter (Signed)
She will need TB quantiferon test. Ok to refill Rx for Humira Follow up in July 2020 office visit if no GI issues. Thanks

## 2018-09-04 NOTE — Telephone Encounter (Signed)
PA faxed to [facility]. New Rx sent to the pharmacy. Patient requests to change to Citrate Free Humira.  She will come in for update labs.

## 2018-09-11 ENCOUNTER — Other Ambulatory Visit: Payer: Self-pay

## 2018-09-14 ENCOUNTER — Other Ambulatory Visit: Payer: Self-pay

## 2018-09-14 ENCOUNTER — Telehealth: Payer: Self-pay | Admitting: Gastroenterology

## 2018-09-14 MED ORDER — ADALIMUMAB 40 MG/0.4ML ~~LOC~~ AJKT: 40.0000 mg | AUTO-INJECTOR | SUBCUTANEOUS | 6 refills | Status: DC

## 2018-09-18 ENCOUNTER — Other Ambulatory Visit: Payer: Self-pay

## 2018-09-18 MED ORDER — ADALIMUMAB 40 MG/0.4ML ~~LOC~~ AJKT: 40.0000 mg | AUTO-INJECTOR | SUBCUTANEOUS | 6 refills | Status: DC

## 2018-09-22 ENCOUNTER — Telehealth: Payer: Self-pay | Admitting: Gastroenterology

## 2018-09-22 NOTE — Telephone Encounter (Signed)
[facility] called inquiring if pt has had initial dose of humira because they will ship maintenance dose tomorrow. They are requesting a call back today.

## 2018-09-22 NOTE — Telephone Encounter (Signed)
Advised pharmacy tech the patient is not a new start on Humira and only needs a continuation of therapy.

## 2018-09-30 DIAGNOSIS — H6591 Unspecified nonsuppurative otitis media, right ear: Secondary | ICD-10-CM | POA: Diagnosis not present

## 2018-10-05 ENCOUNTER — Other Ambulatory Visit: Payer: Self-pay | Admitting: Internal Medicine

## 2018-10-10 ENCOUNTER — Other Ambulatory Visit: Payer: Self-pay | Admitting: Internal Medicine

## 2018-10-22 ENCOUNTER — Telehealth: Payer: Self-pay | Admitting: Gastroenterology

## 2018-10-22 ENCOUNTER — Other Ambulatory Visit: Payer: Self-pay | Admitting: Internal Medicine

## 2018-10-22 NOTE — Telephone Encounter (Signed)
Patient called would like to know if she should go to her concert. She is concerned since she has chron disease and does not know if she should be around a group of people due to the corona virus.   I did tell her that you can not make that decision for her and told her to call her PCP if she like. Pt wants to hear it from Dr. Jillyn Hidden nurse since she was the one that treats her for the Chron's disease.

## 2018-10-22 NOTE — Telephone Encounter (Signed)
Patient is advised. She says she will follow this recommendation and thanks me for the call.

## 2018-10-22 NOTE — Telephone Encounter (Signed)
How would you advise?

## 2018-10-22 NOTE — Telephone Encounter (Signed)
Lot of unknown but based on current information I will recommend her to avoid going to any large gatherings where there is increased risk of exposure to infection.

## 2018-10-26 ENCOUNTER — Inpatient Hospital Stay: Payer: BLUE CROSS/BLUE SHIELD | Admitting: Oncology

## 2018-10-27 ENCOUNTER — Other Ambulatory Visit: Payer: Self-pay | Admitting: Internal Medicine

## 2018-11-09 ENCOUNTER — Other Ambulatory Visit: Payer: Self-pay

## 2018-11-09 MED ORDER — LEVOTHYROXINE SODIUM 88 MCG PO TABS: ORAL_TABLET | ORAL | 0 refills | Status: DC

## 2018-11-23 ENCOUNTER — Telehealth: Payer: Self-pay | Admitting: Oncology

## 2018-11-23 NOTE — Telephone Encounter (Signed)
Spoke with patient re 5/29 f/u.

## 2018-12-30 ENCOUNTER — Telehealth: Payer: Self-pay | Admitting: Oncology

## 2018-12-30 NOTE — Telephone Encounter (Signed)
Per [facility] reschedule list moved May appointment to July. Left message. Schedule mailed.

## 2019-01-08 ENCOUNTER — Ambulatory Visit: Payer: Self-pay | Admitting: Oncology

## 2019-01-18 NOTE — Telephone Encounter (Signed)
Done

## 2019-02-19 ENCOUNTER — Other Ambulatory Visit: Payer: Self-pay

## 2019-02-19 ENCOUNTER — Inpatient Hospital Stay: Payer: BC Managed Care – PPO | Attending: Oncology | Admitting: Oncology

## 2019-02-19 VITALS — BP 119/76 | HR 60 | Temp 98.3°F | Resp 17 | Ht 66.0 in | Wt 156.8 lb

## 2019-02-19 DIAGNOSIS — Z9882 Breast implant status: Secondary | ICD-10-CM

## 2019-02-19 DIAGNOSIS — Z9221 Personal history of antineoplastic chemotherapy: Secondary | ICD-10-CM | POA: Diagnosis not present

## 2019-02-19 DIAGNOSIS — Z9011 Acquired absence of right breast and nipple: Secondary | ICD-10-CM

## 2019-02-19 DIAGNOSIS — Z853 Personal history of malignant neoplasm of breast: Secondary | ICD-10-CM | POA: Insufficient documentation

## 2019-02-19 NOTE — Progress Notes (Signed)
   [facility] OFFICE PROGRESS NOTE   Diagnosis: Breast cancer  INTERVAL HISTORY:   Ms. Kho was last seen at the cancer center 2 years ago.  An appointment was scheduled earlier this year, but was moved secondary to the [place] pandemic.  She feels well.  She continues Humira for inflammatory bowel disease.  No change over either breast.  A left mammogram on 08/26/2018 was negative.  Objective:  Vital signs in last 24 hours:  Blood pressure 119/76, pulse 60, temperature 98.3 F (36.8 C), temperature source Oral, resp. rate 17, height 5' 6"  (1.676 m), weight 156 lb 12.8 oz (71.1 kg), SpO2 100 %.   Limited physical examination secondary to distancing with the coded pandemic Lymphatics: No cervical, supraclavicular, or axillary nodes GI: No hepatomegaly Vascular: No leg edema Breast: Status post right mastectomy with a TRAM reconstruction.  No evidence for chest wall tumor recurrence.  Left breast without mass.   Medications: I have reviewed the patient's current medications.   Assessment/Plan: [name] was diagnosed with right-sided breast cancer in November 1998. She developed a right breast recurrence in December 2003 and underwent a right mastectomy/TRAM reconstruction. She completed adjuvant AC and Taxol chemotherapy. She took tamoxifen August 2004 through July 2009. She began Femara 04/12/2008, discontinued at the end of 2014.    Disposition: Ms. Ayotte remains in clinical remission from breast cancer.  She will continue yearly mammography.  She would like to continue follow-up at the Cancer center.  She will return for an office visit in 1 year. She would like to have repeat tattooing at the right TRAM reconstruction.  I made a referral to plastic surgery.  [name], MD  02/19/2019  9:46 AM

## 2019-02-22 ENCOUNTER — Telehealth: Payer: Self-pay | Admitting: Oncology

## 2019-02-22 NOTE — Telephone Encounter (Signed)
Scheduled per los. Mailed printout  °

## 2019-03-16 ENCOUNTER — Encounter: Payer: Self-pay | Admitting: Nurse Practitioner

## 2019-03-16 ENCOUNTER — Other Ambulatory Visit (INDEPENDENT_AMBULATORY_CARE_PROVIDER_SITE_OTHER): Payer: BC Managed Care – PPO

## 2019-03-16 ENCOUNTER — Ambulatory Visit: Payer: BC Managed Care – PPO | Admitting: Nurse Practitioner

## 2019-03-16 VITALS — BP 98/62 | HR 77 | Ht 65.0 in | Wt 155.0 lb

## 2019-03-16 DIAGNOSIS — E039 Hypothyroidism, unspecified: Secondary | ICD-10-CM

## 2019-03-16 DIAGNOSIS — M8580 Other specified disorders of bone density and structure, unspecified site: Secondary | ICD-10-CM | POA: Diagnosis not present

## 2019-03-16 DIAGNOSIS — R5383 Other fatigue: Secondary | ICD-10-CM

## 2019-03-16 DIAGNOSIS — K50119 Crohn's disease of large intestine with unspecified complications: Secondary | ICD-10-CM

## 2019-03-16 LAB — COMPREHENSIVE METABOLIC PANEL
ALT: 21 U/L (ref 0–35)
AST: 19 U/L (ref 0–37)
Albumin: 4.6 g/dL (ref 3.5–5.2)
Alkaline Phosphatase: 71 U/L (ref 39–117)
BUN: 14 mg/dL (ref 6–23)
CO2: 33 mEq/L — ABNORMAL HIGH (ref 19–32)
Calcium: 10.1 mg/dL (ref 8.4–10.5)
Chloride: 102 mEq/L (ref 96–112)
Creatinine, Ser: 0.73 mg/dL (ref 0.40–1.20)
GFR: 83.16 mL/min (ref >60.00)
Glucose, Bld: 104 mg/dL — ABNORMAL HIGH (ref 70–99)
Potassium: 4.3 mEq/L (ref 3.5–5.1)
Sodium: 140 mEq/L (ref 135–145)
Total Bilirubin: 0.5 mg/dL (ref 0.2–1.2)
Total Protein: 7 g/dL (ref 6.0–8.3)

## 2019-03-16 LAB — CBC
HCT: 40.4 % (ref 36.0–46.0)
Hemoglobin: 13 g/dL (ref 12.0–15.0)
MCHC: 32.3 g/dL (ref 30.0–36.0)
MCV: 84 fl (ref 78.0–100.0)
Platelets: 194 10*3/uL (ref 150.0–400.0)
RBC: 4.81 Mil/uL (ref 3.87–5.11)
RDW: 13.2 % (ref 11.5–15.5)
WBC: 4.9 10*3/uL (ref 4.0–10.5)

## 2019-03-16 LAB — TSH: TSH: 0.47 u[IU]/mL (ref 0.35–4.50)

## 2019-03-16 NOTE — Patient Instructions (Addendum)
If you are age 54 or older, your body mass index should be between 23-30. Your Body mass index is 25.79 kg/m. If this is out of the aforementioned range listed, please consider follow up with your Primary Care Provider.  If you are age 48 or younger, your body mass index should be between 19-25. Your Body mass index is 25.79 kg/m. If this is out of the aformentioned range listed, please consider follow up with your Primary Care Provider.   Your provider has requested that you go to the basement level for lab work before leaving today. Press "B" on the elevator. The lab is located at the first door on the left as you exit the elevator.  We will contact you with the results to your blood work  An order has been place for a bone density scan for December 2020. We will contact you with an appointment date  It was a pleasure to see you today!  Tye Savoy, NP

## 2019-03-16 NOTE — Progress Notes (Signed)
Chief Complaint:    Follow up Crohn's  IMPRESSION and PLAN:     53 year old female with Crohn's colitis diagnosed 2013.  Maintained on Humira milligrams SQ every two weeks.  Overall doing okay.  No abdominal pain.  Bowel movements can vary in consistency between loose and hard depending on diet and hydration status but otherwise normal.  -Pharmacy will contact us for refills on Humira. -Update labs with CBC,CMET, TSH -Needs QuantiFERON gold -Due for bone density study December 2020, will schedule this for her -Colonoscopy due December 2020  HPI:    Patient is a 53 year old female history of right breast cancer in remission previously followed by Dr. Olevia Perches.   Colonoscopy with biopsies of the right and left colon remarkable for granulomatous colitis.  She was started on mesalamine and canasa suppositories. She feels like her symptoms got much worse on Mesalamine. She began needing steroids which helped Crohn's symptoms but led to hair loss. By Jan 2015 steroids weren't helping and decision made to start Biologics. She has been on Humira 40 mg SQ every 2 weeks. Overall she is doing well. Occasionally has "blow out" bowel movements which are often diet related. Otherwise BMs are formed. If doesn't stay hydrated then has hard stools, sometimes with minor bleeding. No arthralgias.  No fevers.  No nausea/vomiting.  She has occasional rectal discomfort described as a weird sensation which occurs and then resolves within a few seconds.  The sensation is not associated with gas nor having a bowel movement.  Rectal exam unremarkable on colonoscopy July 2018.  Lavonne complains of excessive sleepiness / fatigue.  She is now working at home and this makes the problem worse.  Data Reviewed:  No recent labs  Review of systems:     No chest pain, no SOB, no fevers, no urinary sx   Past Medical History:  Diagnosis Date  . Anxiety   . Asthma   . Chronic headaches   . Granulomatous colitis ([place])    . History of bone density study    no osteopenia  . History of MRI of cervical spine 4/03   mild DJD  . HX: breast cancer 11/98 and 12/03   recurrance  . Hypothyroidism   . IBS (irritable bowel syndrome)   . Inguinal hernia   . Personal history of chemotherapy   . Personal history of radiation therapy     Patient's surgical history, family medical history, social history, medications and allergies were all reviewed in Epic   Creatinine clearance cannot be calculated (Patient's most recent lab result is older than the maximum 21 days allowed.)  Current Outpatient Medications  Medication Sig Dispense Refill  . Adalimumab (HUMIRA PEN) 40 MG/0.4ML PNKT Inject 40 mg into the skin every 14 (fourteen) days. 2 each 6  . aspirin 81 MG tablet Take 81 mg by mouth daily as needed.     Marland Kitchen b complex vitamins capsule Take 1 capsule by mouth at bedtime.     . Biotin 5000 MCG CAPS Take 5,000 mcg by mouth 2 (two) times daily.     . Calcium Carb-Cholecalciferol (CALCIUM + D3 PO) Take 1 tablet by mouth at bedtime.     . cetirizine (ZYRTEC) 10 MG tablet Take 10 mg by mouth daily.    Marland Kitchen desonide (DESOWEN) 0.05 % cream APPLY TOPICALLY TWICE DAILY (Patient taking differently: APPLY TOPICALLY TWICE DAILY as needed) 2 g 0  . escitalopram (LEXAPRO) 10 MG tablet TAKE ONE TABLET BY MOUTH  DAILY 90 tablet 1  . fluticasone (FLONASE) 50 MCG/ACT nasal spray Place 2 sprays into both nostrils daily. In each nostril as needed (Patient taking differently: Place 2 sprays into both nostrils daily as needed for allergies. ) 16 g 2  . ibuprofen (ADVIL,MOTRIN) 200 MG tablet Take 600 mg by mouth every 6 (six) hours as needed for moderate pain.     Marland Kitchen levothyroxine (SYNTHROID, LEVOTHROID) 88 MCG tablet TAKE 1 TABLET BY MOUTH  DAILY BEFORE BREAKFAST 90 tablet 0  . MELATONIN PO Take 12 mg by mouth at bedtime.    . Multiple Vitamin (MULTIVITAMIN) tablet Take 1 tablet by mouth daily.    . Vaginal Lubricant (REPLENS) GEL USE AS  DIRECTED (Patient taking differently: One application Daily as needed for dryness) 35 g 0  . VITAMIN E PO Take 1 capsule by mouth daily.      No current facility-administered medications for this visit.     Physical Exam:     BP 98/62   Pulse 77   Ht 5' 5"  (1.651 m)   Wt 155 lb (70.3 kg)   BMI 25.79 kg/m   GENERAL:  Pleasant female in NAD PSYCH: : Cooperative, normal affect EENT:  conjunctiva pink, mucous membranes moist, neck supple without masses CARDIAC:  RRR, no murmur heard, no peripheral edema PULM: Normal respiratory effort, lungs CTA bilaterally, no wheezing ABDOMEN:  Nondistended, soft, nontender. No obvious masses, no hepatomegaly,  normal bowel sounds SKIN:  turgor, no lesions seen Musculoskeletal:  Normal muscle tone, normal strength NEURO: Alert and oriented x 3, no focal neurologic deficits   Tye Savoy , NP 03/16/2019, 10:53 AM

## 2019-03-17 ENCOUNTER — Encounter: Payer: Self-pay | Admitting: Internal Medicine

## 2019-03-17 ENCOUNTER — Other Ambulatory Visit: Payer: Self-pay | Admitting: Gastroenterology

## 2019-03-29 DIAGNOSIS — Z923 Personal history of irradiation: Secondary | ICD-10-CM | POA: Diagnosis not present

## 2019-03-29 DIAGNOSIS — Z853 Personal history of malignant neoplasm of breast: Secondary | ICD-10-CM | POA: Diagnosis not present

## 2019-03-29 DIAGNOSIS — Z9889 Other specified postprocedural states: Secondary | ICD-10-CM | POA: Diagnosis not present

## 2019-04-13 ENCOUNTER — Other Ambulatory Visit: Payer: Self-pay | Admitting: Gastroenterology

## 2019-04-26 ENCOUNTER — Other Ambulatory Visit: Payer: Self-pay | Admitting: Internal Medicine

## 2019-04-26 DIAGNOSIS — Z923 Personal history of irradiation: Secondary | ICD-10-CM | POA: Diagnosis not present

## 2019-04-26 DIAGNOSIS — Z9011 Acquired absence of right breast and nipple: Secondary | ICD-10-CM | POA: Diagnosis not present

## 2019-04-26 DIAGNOSIS — Z853 Personal history of malignant neoplasm of breast: Secondary | ICD-10-CM | POA: Diagnosis not present

## 2019-05-19 ENCOUNTER — Other Ambulatory Visit: Payer: Self-pay | Admitting: Gastroenterology

## 2019-05-20 ENCOUNTER — Other Ambulatory Visit: Payer: Self-pay | Admitting: Internal Medicine

## 2019-05-24 ENCOUNTER — Encounter: Payer: Self-pay | Admitting: Internal Medicine

## 2019-05-25 ENCOUNTER — Other Ambulatory Visit: Payer: Self-pay

## 2019-05-25 MED ORDER — LEVOTHYROXINE SODIUM 88 MCG PO TABS: ORAL_TABLET | ORAL | 0 refills | Status: DC

## 2019-05-31 ENCOUNTER — Ambulatory Visit: Payer: Self-pay | Admitting: *Deleted

## 2019-05-31 NOTE — Progress Notes (Signed)
Reviewed and agree with documentation and assessment and plan. K. Veena Lane Eland , MD   

## 2019-05-31 NOTE — Telephone Encounter (Signed)
Patient states she was packing car last Tuesday- she believes she heard pop- and she is having pain since. Painful to sneeze, move. Patient has appointment tomorrow - video. Patient's appointment has already been change to in person. Reason for Disposition . [1] MILD pain (e.g., does not interfere with normal activities) AND [2] present > 7 days  Answer Assessment - Initial Assessment Questions 1. ONSET: "When did the muscle aches or body pains start?"      Tuesday 2. LOCATION: "What part of your body is hurting?" (e.g., entire body, arms, legs)      Left side under breast- rib area 3. SEVERITY: "How bad is the pain?" (Scale 1-10; or mild, moderate, severe)   - MILD (1-3): doesn't interfere with normal activities    - MODERATE (4-7): interferes with normal activities or awakens from sleep    - SEVERE (8-10):  excruciating pain, unable to do any normal activities      mild 4. CAUSE: "What do you think is causing the pains?"     Pulled muscle 5. FEVER: "Have you been having fever?"     no 6. OTHER SYMPTOMS: "Do you have any other symptoms?" (e.g., chest pain, weakness, rash, cold or flu symptoms, weight loss)     Pain in ribs- sometimes hurt when breathing 7. PREGNANCY: "Is there any chance you are pregnant?" "When was your last menstrual period?"     n/a 8. TRAVEL: "Have you traveled out of the country in the last month?" (e.g., travel history, exposures)     no  Protocols used: MUSCLE ACHES AND BODY PAIN-A-AH

## 2019-05-31 NOTE — Progress Notes (Signed)
Subjective:    Patient ID: [name], female    DOB: [birth year], 53 y.o.   MRN: [id number]  HPI The patient is here for follow up and pain near ribs.  Pain near ribs on left side: she was packing stuff recently and was pushing hard on something and had sudden onset of pain in her left lower anterior ribs.  She did hear something pop and has had a dull ache in that area since.  She feels like the area is swollen and it is tender to touch.  She was unsure if she broke something or pulled something.      Hypothyroidism:  She is taking her medication daily.  She denies any recent changes in energy or weight that are unexplained.   Prediabetes:  She is fairly compliant with a low sugar/carbohydrate diet.  She is exercising regularly.  Anxiety, Depression: She is taking her medication daily as prescribed. She denies any side effects from the medication. She feels her depression and anxiety are well controlled and she is happy with her current dose of medication.   She has a crack on her nose that recurs.  She has tried desonide cream, nasal air gel and neosporin.  It tends to heal, but keeps recurring.  She has discussed this with an ENT.  She was unsure if this was related to Crohn's disease.  He was also told that she had an infection in her sinuses that was causing some of the sores in her nasal passageways.   Medications and allergies reviewed with patient and updated if appropriate.  Patient Active Problem List   Diagnosis Date Noted  . Rib pain on left side 06/01/2019  . Prediabetes 03/14/2018  . Cervicogenic headache 03/11/2018  . History of breast cancer 01/24/2017  . Overactive bladder 01/24/2017  . PVC (premature ventricular contraction) 07/15/2016  . Insomnia 01/11/2014  . Anxiety associated with depression 11/09/2013  . Crohn's colitis ([place]) 11/09/2012  . Osteopenia 10/29/2012  . Hypothyroidism 04/22/2012  . GOITER, MULTINODULAR 07/14/2007  . INCONTINENCE, URGE  10/09/2006    Current Outpatient Medications on File Prior to Visit  Medication Sig Dispense Refill  . aspirin 81 MG tablet Take 81 mg by mouth daily as needed.     Marland Kitchen b complex vitamins capsule Take 1 capsule by mouth at bedtime.     . Biotin 5000 MCG CAPS Take 5,000 mcg by mouth 2 (two) times daily.     . Calcium Carb-Cholecalciferol (CALCIUM + D3 PO) Take 1 tablet by mouth at bedtime.     . cetirizine (ZYRTEC) 10 MG tablet Take 10 mg by mouth daily.    Marland Kitchen escitalopram (LEXAPRO) 10 MG tablet Take 1 tablet (10 mg total) by mouth daily. Need office visit for more refills. 30 tablet 0  . fluticasone (FLONASE) 50 MCG/ACT nasal spray Place 2 sprays into both nostrils daily. In each nostril as needed (Patient taking differently: Place 2 sprays into both nostrils daily as needed for allergies. ) 16 g 2  . HUMIRA PEN 40 MG/0.4ML PNKT INJECT 1 PEN UNDER THE SKIN (SUBCUTANEOUS INJECTION) ONCE EVERY 14 DAYS 2 each 0  . ibuprofen (ADVIL,MOTRIN) 200 MG tablet Take 600 mg by mouth every 6 (six) hours as needed for moderate pain.     Marland Kitchen levothyroxine (SYNTHROID) 88 MCG tablet TAKE 1 TABLET BY MOUTH  DAILY BEFORE BREAKFAST 90 tablet 0  . MELATONIN PO Take 12 mg by mouth at bedtime.    . Multiple Vitamin (  MULTIVITAMIN) tablet Take 1 tablet by mouth daily.    . Vaginal Lubricant (REPLENS) GEL USE AS DIRECTED (Patient taking differently: One application Daily as needed for dryness) 35 g 0  . VITAMIN E PO Take 1 capsule by mouth daily.      No current facility-administered medications on file prior to visit.     Past Medical History:  Diagnosis Date  . Anxiety   . Asthma   . Chronic headaches   . Granulomatous colitis ([place])   . History of bone density study    no osteopenia  . History of MRI of cervical spine 4/03   mild DJD  . HX: breast cancer 11/98 and 12/03   recurrance  . Hypothyroidism   . IBS (irritable bowel syndrome)   . Inguinal hernia   . Personal history of chemotherapy   . Personal  history of radiation therapy     Past Surgical History:  Procedure Laterality Date  . INGUINAL HERNIA REPAIR Right   . MASTECTOMY Right   . TOTAL ABDOMINAL HYSTERECTOMY W/ BILATERAL SALPINGOOPHORECTOMY  2007   left cervix in place    Social History   Socioeconomic History  . Marital status: Single    Spouse name: Not on file  . Number of children: 0  . Years of education: Not on file  . Highest education level: Not on file  Occupational History  . Occupation: managed care    Employer: [employer]  . Financial resource strain: Not on file  . Food insecurity    Worry: Not on file    Inability: Not on file  . Transportation needs    Medical: Not on file    Non-medical: Not on file  Tobacco Use  . Smoking status: Never Smoker  . Smokeless tobacco: Never Used  Substance and Sexual Activity  . Alcohol use: Yes    Comment: occas  . Drug use: No  . Sexual activity: Not on file  Lifestyle  . Physical activity    Days per week: Not on file    Minutes per session: Not on file  . Stress: Not on file  Relationships  . Social Herbalist on phone: Not on file    Gets together: Not on file    Attends religious service: Not on file    Active member of club or organization: Not on file    Attends meetings of clubs or organizations: Not on file    Relationship status: Not on file  Other Topics Concern  . Not on file  Social History Narrative   Exercise; runs    Family History  Problem Relation Age of Onset  . Hypertension Mother   . Colon polyps Mother   . Irritable bowel syndrome Mother   . Skin cancer Father   . Melanoma Father   . Breast cancer Paternal Aunt   . Colon polyps Brother   . Psoriasis Brother   . Dermatomyositis Brother   . Diabetes Neg Hx   . Heart disease Neg Hx   . Stroke Neg Hx     Review of Systems  Constitutional: Negative for chills and fever.  Respiratory: Positive for wheezing (occ with activity). Negative for  cough and shortness of breath.   Cardiovascular: Positive for palpitations. Negative for chest pain and leg swelling.  Neurological: Positive for headaches (frequently). Negative for light-headedness.       Objective:   Vitals:   06/01/19 1332  BP: 114/72  Pulse: 72  Resp: 16  Temp: 98.8 F (37.1 C)  SpO2: 96%   BP Readings from Last 3 Encounters:  06/01/19 114/72  03/16/19 98/62  02/19/19 119/76   Wt Readings from Last 3 Encounters:  06/01/19 157 lb (71.2 kg)  03/16/19 155 lb (70.3 kg)  02/19/19 156 lb 12.8 oz (71.1 kg)   Body mass index is 26.13 kg/m.   Physical Exam    Constitutional: Appears well-developed and well-nourished. No distress.  HENT:  Head: Normocephalic and atraumatic.  Neck: Neck supple. No tracheal deviation present. No thyromegaly present.  No cervical lymphadenopathy Cardiovascular: Normal rate, regular rhythm and normal heart sounds.   No murmur heard. No carotid bruit .  No edema Pulmonary/Chest: Left anterior lower rib cage tender to palpation, no obvious deformity effort normal and breath sounds normal. No respiratory distress. No has no wheezes. No rales.  Skin: Skin is warm and dry. Not diaphoretic.  Small skin break left nare-no active discharge or bleeding, started with dry skin Psychiatric: Normal mood and affect. Behavior is normal.      Assessment & Plan:   For the chronic, recurring skin cracking near her nose will try Bactroban ointment If this is not effective can consider an antifungal Can also follow-up with ENT if needed  See Problem List for Assessment and Plan of chronic medical problems.

## 2019-05-31 NOTE — Patient Instructions (Addendum)
Have blood work and an x-ray today.   Tests ordered today. Your results will be released to [facility] (or called to you) after review.  If any changes need to be made, you will be notified at that same time.   Medications reviewed and updated.  Changes include :   Bactroban antibacterial ointment for your nose.    Your prescription(s) have been submitted to your pharmacy. Please take as directed and contact our office if you believe you are having problem(s) with the medication(s).    Please followup in 1 year

## 2019-06-01 ENCOUNTER — Encounter: Payer: Self-pay | Admitting: Internal Medicine

## 2019-06-01 ENCOUNTER — Other Ambulatory Visit (INDEPENDENT_AMBULATORY_CARE_PROVIDER_SITE_OTHER): Payer: BC Managed Care – PPO

## 2019-06-01 ENCOUNTER — Ambulatory Visit (INDEPENDENT_AMBULATORY_CARE_PROVIDER_SITE_OTHER): Payer: BC Managed Care – PPO | Admitting: Internal Medicine

## 2019-06-01 ENCOUNTER — Other Ambulatory Visit: Payer: Self-pay

## 2019-06-01 ENCOUNTER — Ambulatory Visit (INDEPENDENT_AMBULATORY_CARE_PROVIDER_SITE_OTHER)
Admission: RE | Admit: 2019-06-01 | Discharge: 2019-06-01 | Disposition: A | Discharge status: Home / Self Care | Payer: BC Managed Care – PPO | Source: Ambulatory Visit | Attending: Internal Medicine | Admitting: Internal Medicine

## 2019-06-01 VITALS — BP 114/72 | HR 72 | Temp 98.8°F | Resp 16 | Ht 65.0 in | Wt 157.0 lb

## 2019-06-01 DIAGNOSIS — R7303 Prediabetes: Secondary | ICD-10-CM

## 2019-06-01 DIAGNOSIS — E039 Hypothyroidism, unspecified: Secondary | ICD-10-CM

## 2019-06-01 DIAGNOSIS — R0781 Pleurodynia: Secondary | ICD-10-CM

## 2019-06-01 DIAGNOSIS — F418 Other specified anxiety disorders: Secondary | ICD-10-CM | POA: Diagnosis not present

## 2019-06-01 LAB — TSH: TSH: 0.72 u[IU]/mL (ref 0.35–4.50)

## 2019-06-01 LAB — COMPREHENSIVE METABOLIC PANEL
ALT: 14 U/L (ref 0–35)
AST: 18 U/L (ref 0–37)
Albumin: 4.5 g/dL (ref 3.5–5.2)
Alkaline Phosphatase: 78 U/L (ref 39–117)
BUN: 18 mg/dL (ref 6–23)
CO2: 31 mEq/L (ref 19–32)
Calcium: 9.4 mg/dL (ref 8.4–10.5)
Chloride: 100 mEq/L (ref 96–112)
Creatinine, Ser: 0.72 mg/dL (ref 0.40–1.20)
GFR: 84.42 mL/min (ref >60.00)
Glucose, Bld: 84 mg/dL (ref 70–99)
Potassium: 4 mEq/L (ref 3.5–5.1)
Sodium: 137 mEq/L (ref 135–145)
Total Bilirubin: 0.4 mg/dL (ref 0.2–1.2)
Total Protein: 6.8 g/dL (ref 6.0–8.3)

## 2019-06-01 LAB — CBC WITH DIFFERENTIAL/PLATELET
Basophils Absolute: 0.1 10*3/uL (ref 0.0–0.1)
Basophils Relative: 1.5 % (ref 0.0–3.0)
Eosinophils Absolute: 0.2 10*3/uL (ref 0.0–0.7)
Eosinophils Relative: 2.9 % (ref 0.0–5.0)
HCT: 37.2 % (ref 36.0–46.0)
Hemoglobin: 12.3 g/dL (ref 12.0–15.0)
Lymphocytes Relative: 32.1 % (ref 12.0–46.0)
Lymphs Abs: 2 10*3/uL (ref 0.7–4.0)
MCHC: 33.1 g/dL (ref 30.0–36.0)
MCV: 83.7 fl (ref 78.0–100.0)
Monocytes Absolute: 0.5 10*3/uL (ref 0.1–1.0)
Monocytes Relative: 8.3 % (ref 3.0–12.0)
Neutro Abs: 3.4 10*3/uL (ref 1.4–7.7)
Neutrophils Relative %: 55.2 % (ref 43.0–77.0)
Platelets: 202 10*3/uL (ref 150.0–400.0)
RBC: 4.45 Mil/uL (ref 3.87–5.11)
RDW: 12.9 % (ref 11.5–15.5)
WBC: 6.1 10*3/uL (ref 4.0–10.5)

## 2019-06-01 LAB — HEMOGLOBIN A1C: Hgb A1c MFr Bld: 6.1 % (ref 4.6–6.5)

## 2019-06-01 MED ORDER — MUPIROCIN 2 % EX OINT: 1.0000 "application " | TOPICAL_OINTMENT | Freq: Two times a day (BID) | CUTANEOUS | 1 refills | Status: DC

## 2019-06-01 NOTE — Assessment & Plan Note (Signed)
Less likely fracture, possible muscle or tendon pull versus dislocated ribs Rib x-ray If no improvement advised to see sports medicine

## 2019-06-01 NOTE — Assessment & Plan Note (Signed)
Controlled, stable Continue current dose of medication - lexapro 10 mg

## 2019-06-01 NOTE — Assessment & Plan Note (Signed)
Check a1c Low sugar / carb diet Stressed regular exercise   

## 2019-06-01 NOTE — Assessment & Plan Note (Signed)
Clinically euthyroid Check tsh  Titrate med dose if needed  

## 2019-06-02 ENCOUNTER — Encounter: Payer: Self-pay | Admitting: Internal Medicine

## 2019-06-03 ENCOUNTER — Encounter: Payer: Self-pay | Admitting: Internal Medicine

## 2019-06-09 ENCOUNTER — Other Ambulatory Visit: Payer: Self-pay | Admitting: Gastroenterology

## 2019-06-15 ENCOUNTER — Other Ambulatory Visit: Payer: Self-pay | Admitting: Nurse Practitioner

## 2019-06-15 ENCOUNTER — Telehealth: Payer: Self-pay | Admitting: General Surgery

## 2019-06-15 DIAGNOSIS — Z1231 Encounter for screening mammogram for malignant neoplasm of breast: Secondary | ICD-10-CM

## 2019-06-15 NOTE — Telephone Encounter (Signed)
-----   Message from Letta Pate, [place] sent at 03/16/2019 11:38 AM EDT ----- Regarding: dexa scan Need to schedule the patients DEXA after July 30, 2019. Order is already placed for December 2020

## 2019-06-15 NOTE — Telephone Encounter (Signed)
Contacted the [facility] spoke with Lilia Pro, scheduled the patient for a DEXA 09/06/2019 at 8:30 am and Lilia Pro scheduled her for her Mammo the same day at 8:00 am.

## 2019-06-15 NOTE — Telephone Encounter (Signed)
Left a voicemail for the patient stating her appointment for the DEXA scan was scheduled 09/06/2019 at 8:00 at the breast center of [place]. Morgan at the breast center scheduled her mammogram for that day as well. Patient informed to call back with any questions

## 2019-06-16 ENCOUNTER — Telehealth: Payer: Self-pay

## 2019-06-16 ENCOUNTER — Other Ambulatory Visit: Payer: Self-pay

## 2019-06-16 DIAGNOSIS — K50119 Crohn's disease of large intestine with unspecified complications: Secondary | ICD-10-CM

## 2019-06-16 NOTE — Telephone Encounter (Signed)
Confirmed the labs needed. Patient would like to be communicated the results and states through My Chart works well for her.

## 2019-06-16 NOTE — Telephone Encounter (Signed)
Pt would like to speak with you regarding labs ordered.

## 2019-06-16 NOTE — Telephone Encounter (Signed)
Called to patient. No answer. Left a message to advise she is due some labs per Dr Silverio Decamp. This was noted when a refill request came in from [facility] for Humira. The patient needs non-fasting labs for B12 level, Folate, Ferritin,  and TB screening.

## 2019-06-17 ENCOUNTER — Other Ambulatory Visit: Payer: Self-pay

## 2019-06-17 DIAGNOSIS — K50119 Crohn's disease of large intestine with unspecified complications: Secondary | ICD-10-CM

## 2019-06-18 ENCOUNTER — Other Ambulatory Visit: Payer: Self-pay | Admitting: Internal Medicine

## 2019-07-06 ENCOUNTER — Other Ambulatory Visit: Payer: Self-pay | Admitting: Gastroenterology

## 2019-07-16 ENCOUNTER — Telehealth: Payer: Self-pay

## 2019-07-16 NOTE — Telephone Encounter (Signed)
Spoke with the patient. Prior authorization sent in through Cover My Meds for Humira has been submitted. Patient advised labs are stable. We do need her to come by and have the TB screening done. It was not drawn at her last visit.

## 2019-08-01 ENCOUNTER — Other Ambulatory Visit: Payer: Self-pay | Admitting: Gastroenterology

## 2019-08-02 ENCOUNTER — Encounter: Payer: Self-pay | Admitting: Internal Medicine

## 2019-08-25 ENCOUNTER — Other Ambulatory Visit: Payer: Self-pay | Admitting: Internal Medicine

## 2019-08-31 ENCOUNTER — Other Ambulatory Visit: Payer: Self-pay | Admitting: Gastroenterology

## 2019-09-06 ENCOUNTER — Ambulatory Visit
Admission: RE | Admit: 2019-09-06 | Discharge: 2019-09-06 | Disposition: A | Discharge status: Home / Self Care | Payer: BC Managed Care – PPO | Source: Ambulatory Visit | Attending: Nurse Practitioner | Admitting: Nurse Practitioner

## 2019-09-06 ENCOUNTER — Encounter: Payer: Self-pay | Admitting: Internal Medicine

## 2019-09-06 ENCOUNTER — Ambulatory Visit
Admission: RE | Admit: 2019-09-06 | Discharge: 2019-09-06 | Disposition: A | Discharge status: Home / Self Care | Payer: Managed Care, Other (non HMO) | Source: Ambulatory Visit | Attending: Nurse Practitioner | Admitting: Nurse Practitioner

## 2019-09-06 ENCOUNTER — Other Ambulatory Visit: Payer: Self-pay

## 2019-09-06 DIAGNOSIS — Z1231 Encounter for screening mammogram for malignant neoplasm of breast: Secondary | ICD-10-CM

## 2019-09-06 DIAGNOSIS — M8580 Other specified disorders of bone density and structure, unspecified site: Secondary | ICD-10-CM

## 2019-09-10 ENCOUNTER — Other Ambulatory Visit: Payer: Self-pay

## 2019-09-10 MED ORDER — ESCITALOPRAM OXALATE 10 MG PO TABS: 10.0000 mg | ORAL_TABLET | Freq: Every day | ORAL | 0 refills | Status: DC

## 2019-09-21 ENCOUNTER — Other Ambulatory Visit: Payer: Self-pay

## 2019-09-21 MED ORDER — HUMIRA (2 PEN) 40 MG/0.4ML ~~LOC~~ AJKT: 40.0000 mg | AUTO-INJECTOR | SUBCUTANEOUS | 1 refills | Status: DC

## 2019-10-01 ENCOUNTER — Encounter: Payer: Self-pay | Admitting: Gastroenterology

## 2019-10-01 ENCOUNTER — Other Ambulatory Visit: Payer: Self-pay

## 2019-10-01 ENCOUNTER — Ambulatory Visit (INDEPENDENT_AMBULATORY_CARE_PROVIDER_SITE_OTHER): Payer: Managed Care, Other (non HMO) | Admitting: Gastroenterology

## 2019-10-01 VITALS — BP 90/70 | HR 84 | Temp 98.8°F | Ht 66.0 in | Wt 158.0 lb

## 2019-10-01 DIAGNOSIS — K50119 Crohn's disease of large intestine with unspecified complications: Secondary | ICD-10-CM

## 2019-10-01 DIAGNOSIS — R159 Full incontinence of feces: Secondary | ICD-10-CM

## 2019-10-01 DIAGNOSIS — K625 Hemorrhage of anus and rectum: Secondary | ICD-10-CM

## 2019-10-01 MED ORDER — HYOSCYAMINE SULFATE 0.125 MG SL SUBL: SUBLINGUAL_TABLET | SUBLINGUAL | 0 refills | Status: AC

## 2019-10-01 NOTE — Patient Instructions (Addendum)
If you are age 55 or older, your body mass index should be between 23-30. Your Body mass index is 25.5 kg/m. If this is out of the aforementioned range listed, please consider follow up with your Primary Care Provider.  If you are age 18 or younger, your body mass index should be between 19-25. Your Body mass index is 25.5 kg/m. If this is out of the aformentioned range listed, please consider follow up with your Primary Care Provider.   Prior to your next humira injection I will need to come to our office and pick up a Blood collection kit, you will take it to level B and have this and the rest of your labs drawn.  We have sent the following medications to your pharmacy for you to pick up at your convenience:  Levsin  Please use benefiber 1 tablespoon daily and record on the attached food symptoms diary.  Return to the clinic in 3 months, please call to schedule  Due to recent changes in healthcare laws, you may see the results of your imaging and laboratory studies on MyChart before your provider has had a chance to review them.  We understand that in some cases there may be results that are confusing or concerning to you. Not all laboratory results come back in the same time frame and the provider may be waiting for multiple results in order to interpret others.  Please give [place] 48 hours in order for your provider to thoroughly review all the results before contacting the office for clarification of your results.

## 2019-10-01 NOTE — Progress Notes (Addendum)
[name]    [id number]    Dec 19, 1965  Primary Care Physician:Burns, Claudina Lick, MD  Referring Physician: Binnie Rail, MD [place],  [place] [address]   Chief complaint:  Crohn's colitis HPI:  54 yr old female with h/o crohn's colitis initially diagnosed in 2013 , is here for follow up visit for Crohn's disease She is in clinical remission on Humira 11m every 2 weeks but she does feel mild symptoms for few days before she is due for her Humira injection  She is having increased fecal urgency and watery diarrhea on average once or twice a month, thinks it could be something she ate??  She also noticed blood mixed in stool last week She is taking multiple ibuprofen for severe headache last week  Complains of intermittent rectal spasm and generalized abdominal bloating  Last Humira injection was on February 16, she is due for next dose on March 2  She received both doses of Covid vaccination  Colonoscopy 02/17/2017: Random biopsies from ileum and colon showed no active inflammation or dysplasia.   Outpatient Encounter Medications as of 10/01/2019  Medication Sig  . Adalimumab (HUMIRA PEN) 40 MG/0.4ML PNKT Inject 40 mg into the skin every 14 (fourteen) days.  .Marland Kitchenaspirin 81 MG tablet Take 81 mg by mouth daily as needed.   .Marland Kitchenb complex vitamins capsule Take 1 capsule by mouth at bedtime.   . Biotin 5000 MCG CAPS Take 5,000 mcg by mouth 2 (two) times daily.   . Calcium Carb-Cholecalciferol (CALCIUM + D3 PO) Take 1 tablet by mouth at bedtime.   . cetirizine (ZYRTEC) 10 MG tablet Take 10 mg by mouth daily.  .Marland Kitchenescitalopram (LEXAPRO) 10 MG tablet Take 1 tablet (10 mg total) by mouth daily.  . fluticasone (FLONASE) 50 MCG/ACT nasal spray Place 2 sprays into both nostrils daily. In each nostril as needed (Patient taking differently: Place 2 sprays into both nostrils daily as needed for allergies. )  . ibuprofen (ADVIL,MOTRIN) 200 MG tablet Take 600 mg by mouth  every 6 (six) hours as needed for moderate pain.   .Marland Kitchenlevothyroxine (SYNTHROID) 88 MCG tablet TAKE ONE TABLET BY MOUTH DAILY BEFORE BREAKFAST  . MELATONIN PO Take 12 mg by mouth at bedtime.  . Multiple Vitamin (MULTIVITAMIN) tablet Take 1 tablet by mouth daily.  . mupirocin ointment (BACTROBAN) 2 % Place 1 application into the nose 2 (two) times daily.  . Vaginal Lubricant (REPLENS) GEL USE AS DIRECTED (Patient taking differently: One application Daily as needed for dryness)  . VITAMIN E PO Take 1 capsule by mouth daily.    No facility-administered encounter medications on file as of 10/01/2019.    Allergies as of 10/01/2019 - Review Complete 06/01/2019  Allergen Reaction Noted  . Sulfa antibiotics Rash 09/16/2011    Past Medical History:  Diagnosis Date  . Anxiety   . Asthma   . Chronic headaches   . Granulomatous colitis ([place]   . History of bone density study    no osteopenia  . History of MRI of cervical spine 4/03   mild DJD  . HX: breast cancer 11/98 and 12/03   recurrance  . Hypothyroidism   . IBS (irritable bowel syndrome)   . Inguinal hernia   . Personal history of chemotherapy   . Personal history of radiation therapy     Past Surgical History:  Procedure Laterality Date  . INGUINAL HERNIA REPAIR Right   .  MASTECTOMY Right   . TOTAL ABDOMINAL HYSTERECTOMY W/ BILATERAL SALPINGOOPHORECTOMY  2007   left cervix in place    Family History  Problem Relation Age of Onset  . Hypertension Mother   . Colon polyps Mother   . Irritable bowel syndrome Mother   . Skin cancer Father   . Melanoma Father   . Breast cancer Paternal Aunt   . Colon polyps Brother   . Psoriasis Brother   . Dermatomyositis Brother   . Diabetes Neg Hx   . Heart disease Neg Hx   . Stroke Neg Hx     Social History   Socioeconomic History  . Marital status: Single    Spouse name: Not on file  . Number of children: 0  . Years of education: Not on file  . Highest education level: Not on  file  Occupational History  . Occupation: managed care    Employer: [employer]  Tobacco Use  . Smoking status: Never Smoker  . Smokeless tobacco: Never Used  Substance and Sexual Activity  . Alcohol use: Yes    Comment: occas  . Drug use: No  . Sexual activity: Not on file  Other Topics Concern  . Not on file  Social History Narrative   Exercise; runs   Social Determinants of Health   Financial Resource Strain:   . Difficulty of Paying Living Expenses: Not on file  Food Insecurity:   . Worried About Charity fundraiser in the Last Year: Not on file  . Ran Out of Food in the Last Year: Not on file  Transportation Needs:   . Lack of Transportation (Medical): Not on file  . Lack of Transportation (Non-Medical): Not on file  Physical Activity:   . Days of Exercise per Week: Not on file  . Minutes of Exercise per Session: Not on file  Stress:   . Feeling of Stress : Not on file  Social Connections:   . Frequency of Communication with Friends and Family: Not on file  . Frequency of Social Gatherings with Friends and Family: Not on file  . Attends Religious Services: Not on file  . Active Member of Clubs or Organizations: Not on file  . Attends Archivist Meetings: Not on file  . Marital Status: Not on file  Intimate Partner Violence:   . Fear of Current or Ex-Partner: Not on file  . Emotionally Abused: Not on file  . Physically Abused: Not on file  . Sexually Abused: Not on file      Review of systems: All other review of systems negative except as mentioned in the HPI.   Physical Exam: Vitals:   10/01/19 1055  BP: 90/70  Pulse: 84  Temp: 98.8 F (37.1 C)   Body mass index is 25.5 kg/m. Gen:      No acute distress Abd:      + bowel sounds; soft, non-tender; no palpable masses, no distension Neuro: alert and oriented x 3 Psych: normal mood and affect Rectal exam: Normal anal sphincter tone, no anal fissure or external hemorrhoids Anoscopy: Small  internal hemorrhoids, no active bleeding, normal dentate line, no visible nodules  Data Reviewed:  Reviewed labs, radiology imaging, old records and pertinent past GI work up   Assessment and Plan/Recommendations:  54 year old female with history of Crohn's colitis initial diagnosis in 2013, in clinical remission on Humira 40 mg every 2 weeks  Colonoscopy July 2018 with no active inflammation  Intermittent diarrhea and fecal incontinence  with fecal urgency: Maintain food symptom diary to identify potential food intolerance Trial of lactose-free diet for 1 to 2 weeks Benefiber 1 tablespoon twice daily with meals Avoid or limit use of NSAIDs  Blood in stool: Could be secondary to NSAIDs but will need to exclude active Crohn's flare Check CBC, CMP, CRP, Humira drug trough and antibody level to assess if Humira dose is within therapeutic range  Due for TB QuantiFERON gold Follow-up folate, B12 and iron panel  Intermittent rectal spasm, abdominal bloating and cramping: Use Levsin sublingual 1 tablet daily as needed  Due for surveillance colonoscopy July 2023.  If continues to have persistent symptoms and blood in stool, will consider colonoscopy earlier for further evaluation  Return in 3 months or sooner if needed  This visit required 40 minutes of patient care (this includes precharting, chart review, review of results, face-to-face time used for counseling as well as treatment plan and follow-up. The patient was provided an opportunity to ask questions and all were answered. The patient agreed with the plan and demonstrated an understanding of the instructions.  Damaris Hippo , MD    CC: Binnie Rail, MD  Addendum:  Julianne Rice drug trough therapeutic at 8.3 with undetectable antibody level.

## 2019-10-07 ENCOUNTER — Telehealth: Payer: Self-pay | Admitting: Gastroenterology

## 2019-10-07 NOTE — Telephone Encounter (Signed)
It was faxed over today.

## 2019-10-07 NOTE — Telephone Encounter (Signed)
[facility] called to check on status of PA for Humira that had been faxed over.

## 2019-10-12 ENCOUNTER — Encounter: Payer: Self-pay | Admitting: Gastroenterology

## 2019-10-12 ENCOUNTER — Other Ambulatory Visit (INDEPENDENT_AMBULATORY_CARE_PROVIDER_SITE_OTHER): Payer: Managed Care, Other (non HMO)

## 2019-10-12 DIAGNOSIS — R159 Full incontinence of feces: Secondary | ICD-10-CM | POA: Diagnosis not present

## 2019-10-12 DIAGNOSIS — K625 Hemorrhage of anus and rectum: Secondary | ICD-10-CM | POA: Diagnosis not present

## 2019-10-12 DIAGNOSIS — K50119 Crohn's disease of large intestine with unspecified complications: Secondary | ICD-10-CM

## 2019-10-12 LAB — COMPREHENSIVE METABOLIC PANEL
ALT: 14 U/L (ref 0–35)
AST: 20 U/L (ref 0–37)
Albumin: 4.5 g/dL (ref 3.5–5.2)
Alkaline Phosphatase: 73 U/L (ref 39–117)
BUN: 15 mg/dL (ref 6–23)
CO2: 31 mEq/L (ref 19–32)
Calcium: 10.2 mg/dL (ref 8.4–10.5)
Chloride: 99 mEq/L (ref 96–112)
Creatinine, Ser: 0.76 mg/dL (ref 0.40–1.20)
GFR: 79.21 mL/min (ref >60.00)
Glucose, Bld: 92 mg/dL (ref 70–99)
Potassium: 3.9 mEq/L (ref 3.5–5.1)
Sodium: 138 mEq/L (ref 135–145)
Total Bilirubin: 0.5 mg/dL (ref 0.2–1.2)
Total Protein: 7.3 g/dL (ref 6.0–8.3)

## 2019-10-12 LAB — CBC WITH DIFFERENTIAL/PLATELET
Basophils Absolute: 0 10*3/uL (ref 0.0–0.1)
Basophils Relative: 0.8 % (ref 0.0–3.0)
Eosinophils Absolute: 0.2 10*3/uL (ref 0.0–0.7)
Eosinophils Relative: 3.4 % (ref 0.0–5.0)
HCT: 39.6 % (ref 36.0–46.0)
Hemoglobin: 13.1 g/dL (ref 12.0–15.0)
Lymphocytes Relative: 33.3 % (ref 12.0–46.0)
Lymphs Abs: 1.8 10*3/uL (ref 0.7–4.0)
MCHC: 33.1 g/dL (ref 30.0–36.0)
MCV: 84 fl (ref 78.0–100.0)
Monocytes Absolute: 0.4 10*3/uL (ref 0.1–1.0)
Monocytes Relative: 8.1 % (ref 3.0–12.0)
Neutro Abs: 2.9 10*3/uL (ref 1.4–7.7)
Neutrophils Relative %: 54.4 % (ref 43.0–77.0)
Platelets: 202 10*3/uL (ref 150.0–400.0)
RBC: 4.72 Mil/uL (ref 3.87–5.11)
RDW: 12.8 % (ref 11.5–15.5)
WBC: 5.3 10*3/uL (ref 4.0–10.5)

## 2019-10-12 LAB — IBC + FERRITIN
Ferritin: 75.3 ng/mL (ref 10.0–291.0)
Iron: 75 ug/dL (ref 42–145)
Saturation Ratios: 27.6 % (ref 20.0–50.0)
Transferrin: 194 mg/dL — ABNORMAL LOW (ref 212.0–360.0)

## 2019-10-12 LAB — VITAMIN B12: Vitamin B-12: 808 pg/mL (ref 211–911)

## 2019-10-12 LAB — FOLATE: Folate: >23.7 ng/mL (ref >5.9)

## 2019-10-12 LAB — C-REACTIVE PROTEIN: CRP: <1 mg/dL (ref 0.5–20.0)

## 2019-10-13 NOTE — Telephone Encounter (Signed)
Received statement of approval for the Humira 40 mg/0.92m Pen injection kit from 10/08/19 until [facility]

## 2019-11-19 ENCOUNTER — Telehealth: Payer: Self-pay | Admitting: Gastroenterology

## 2019-11-19 NOTE — Telephone Encounter (Signed)
Patient is advised of the results and the recommendations.  Insurance information received and forwarded.

## 2019-11-19 NOTE — Telephone Encounter (Signed)
No detectable Ab level to Humira and Humira drug trough is at target at 8.4. Continue therapy at current dose.Thanks

## 2019-11-19 NOTE — Telephone Encounter (Signed)
The lab test she is referring to is the Humira trough and atb level. I have asked her to show me what she received. I will forward it to you.

## 2019-11-21 ENCOUNTER — Other Ambulatory Visit: Payer: Self-pay | Admitting: Internal Medicine

## 2019-11-30 ENCOUNTER — Other Ambulatory Visit: Payer: Self-pay | Admitting: Internal Medicine

## 2019-12-12 IMAGING — MG DIGITAL SCREENING UNILATERAL LEFT MAMMOGRAM WITH CAD AND TOMO
4 series · 4 of 12 positions shown · non-contrast
Comparison: Previous exam(s).

CLINICAL DATA: Screening. Prior right mastectomy.

EXAM:
DIGITAL SCREENING UNILATERAL LEFT MAMMOGRAM WITH CAD AND TOMO

[L MLO synth-2D]
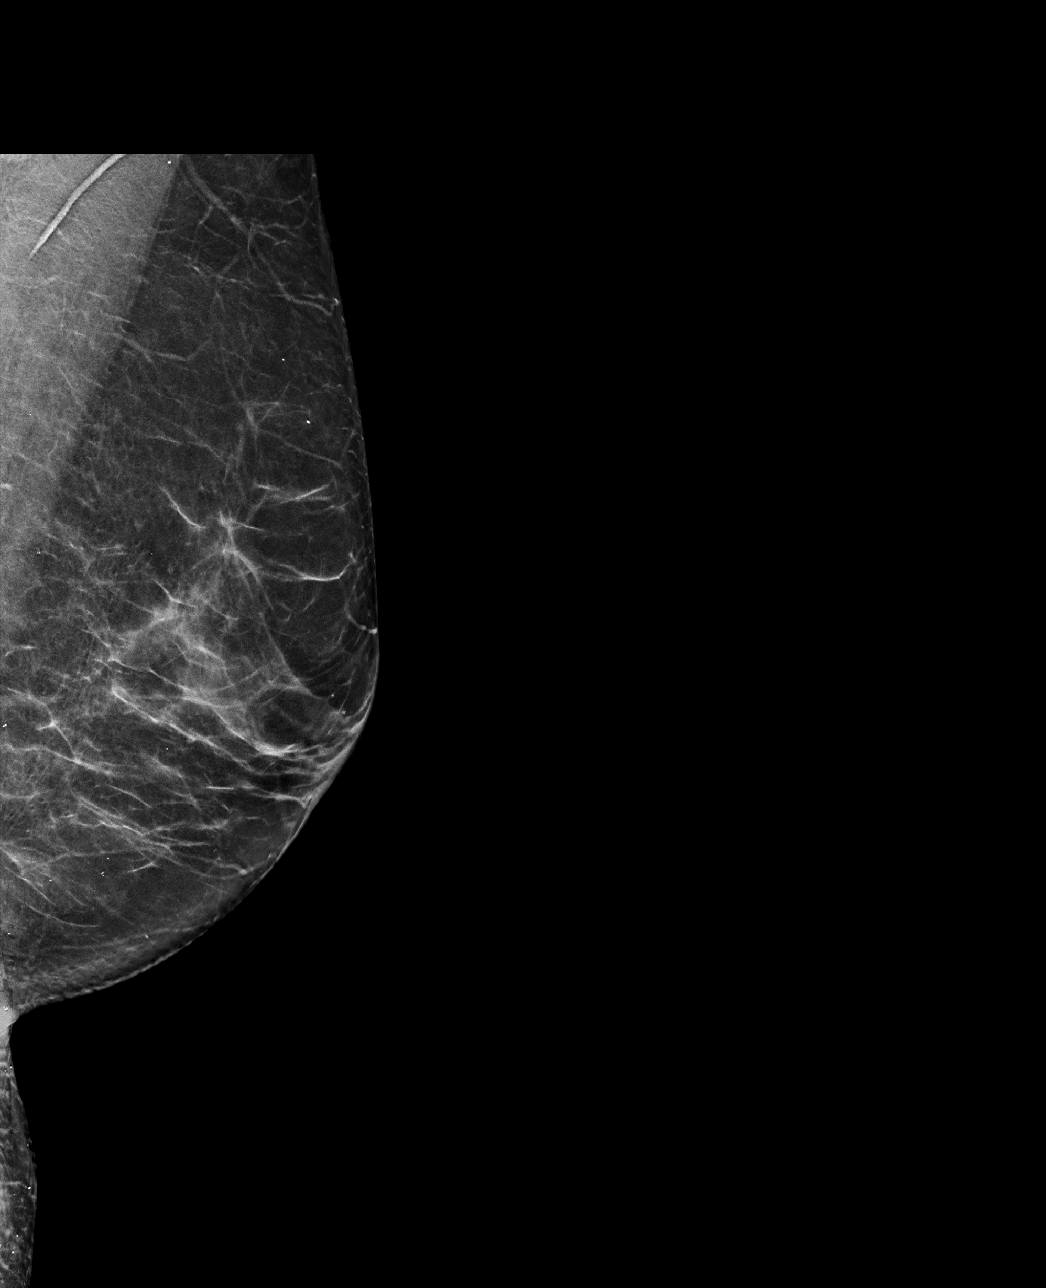

[L CC synth-2D]
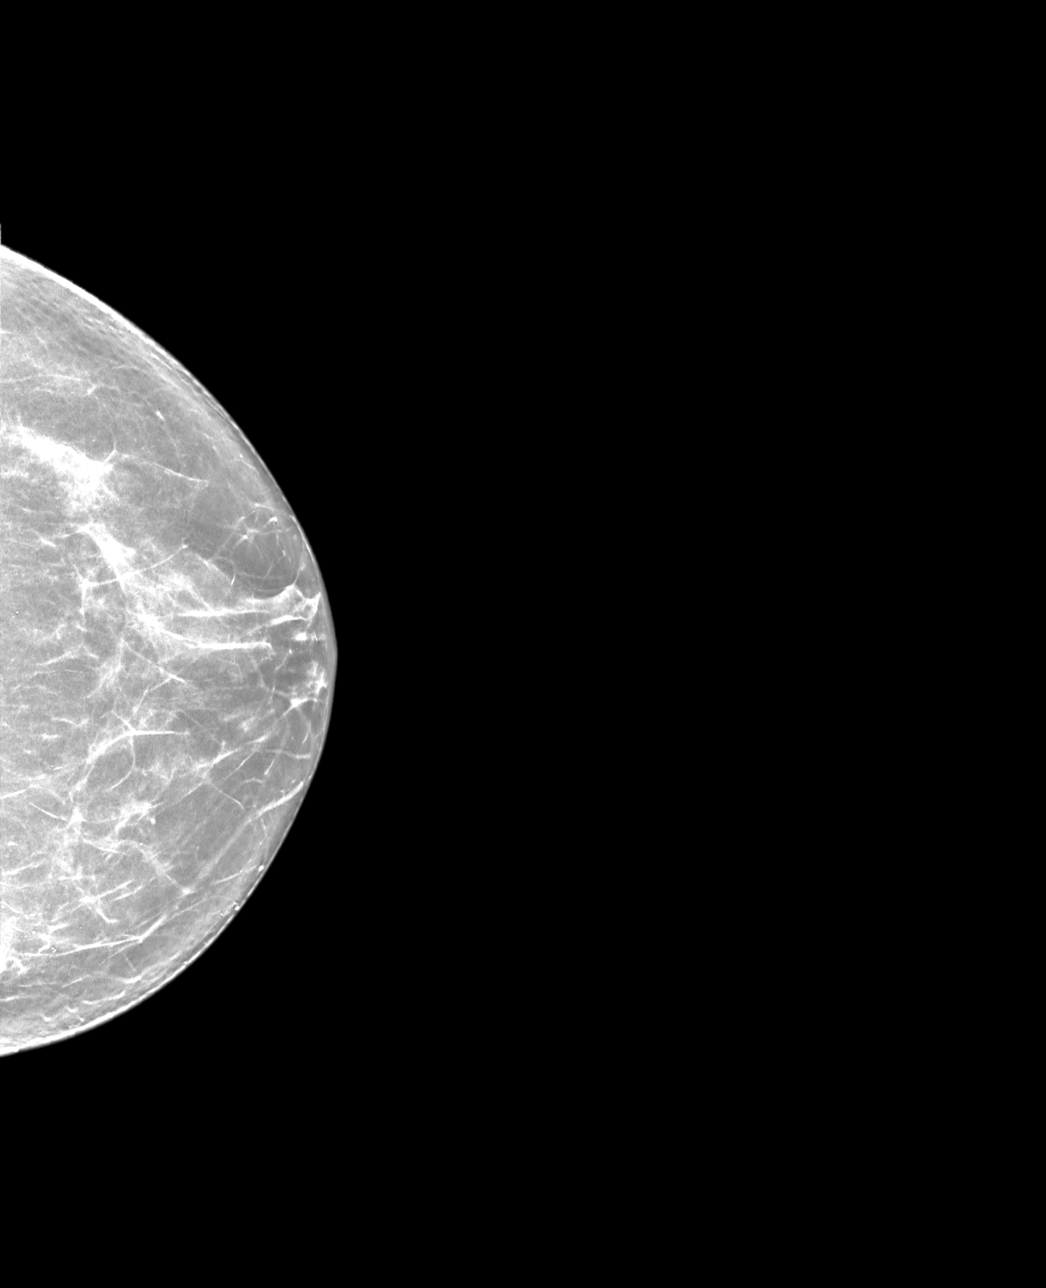

[L MLO tomo · tomo slice 35/70.0]
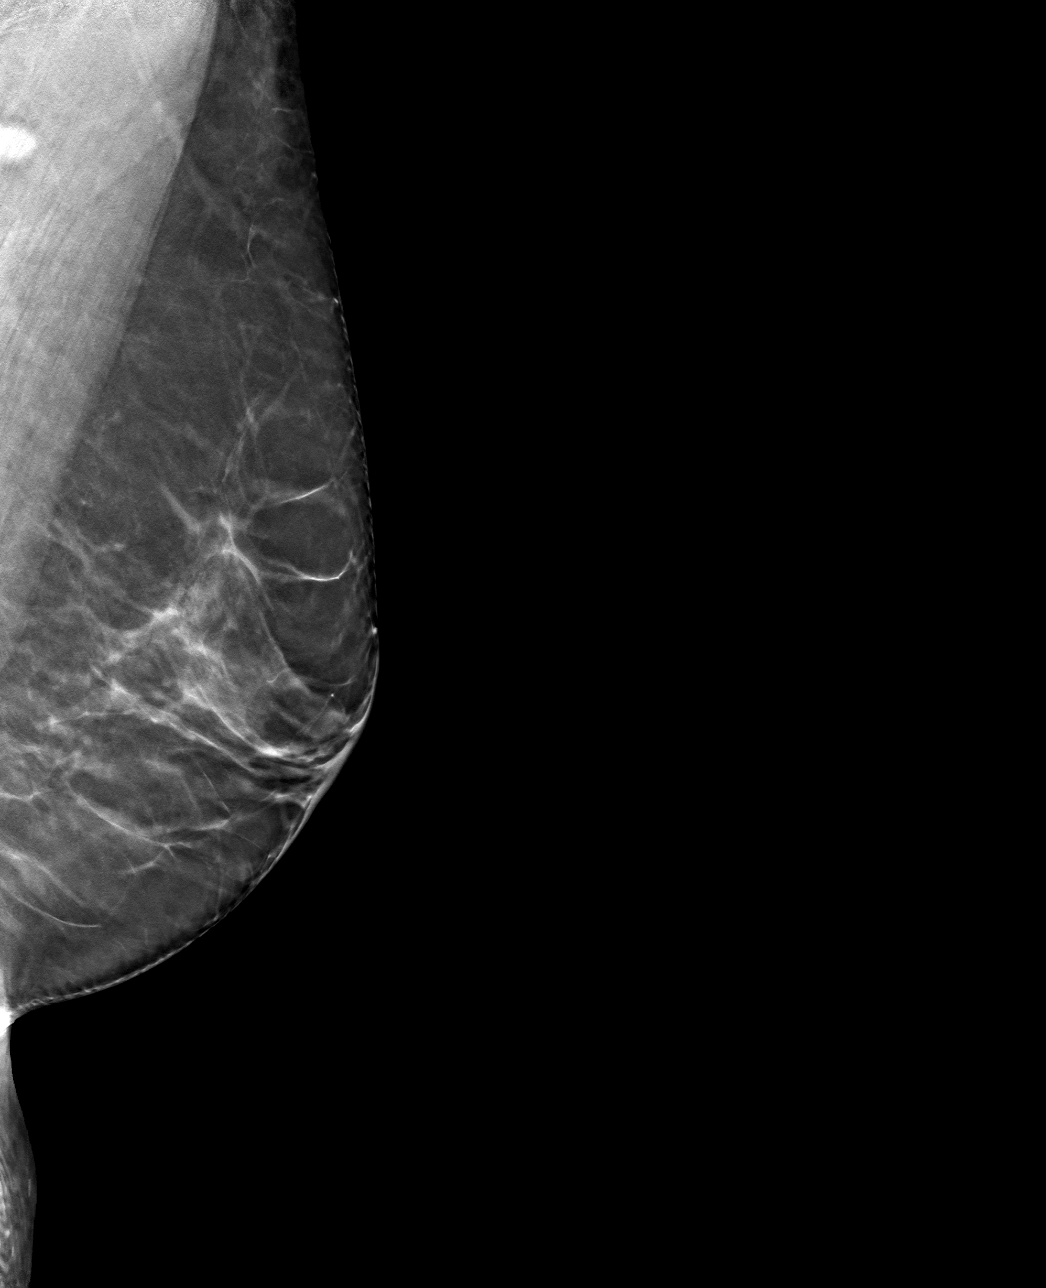

[L CC tomo · tomo slice 36/71.0]
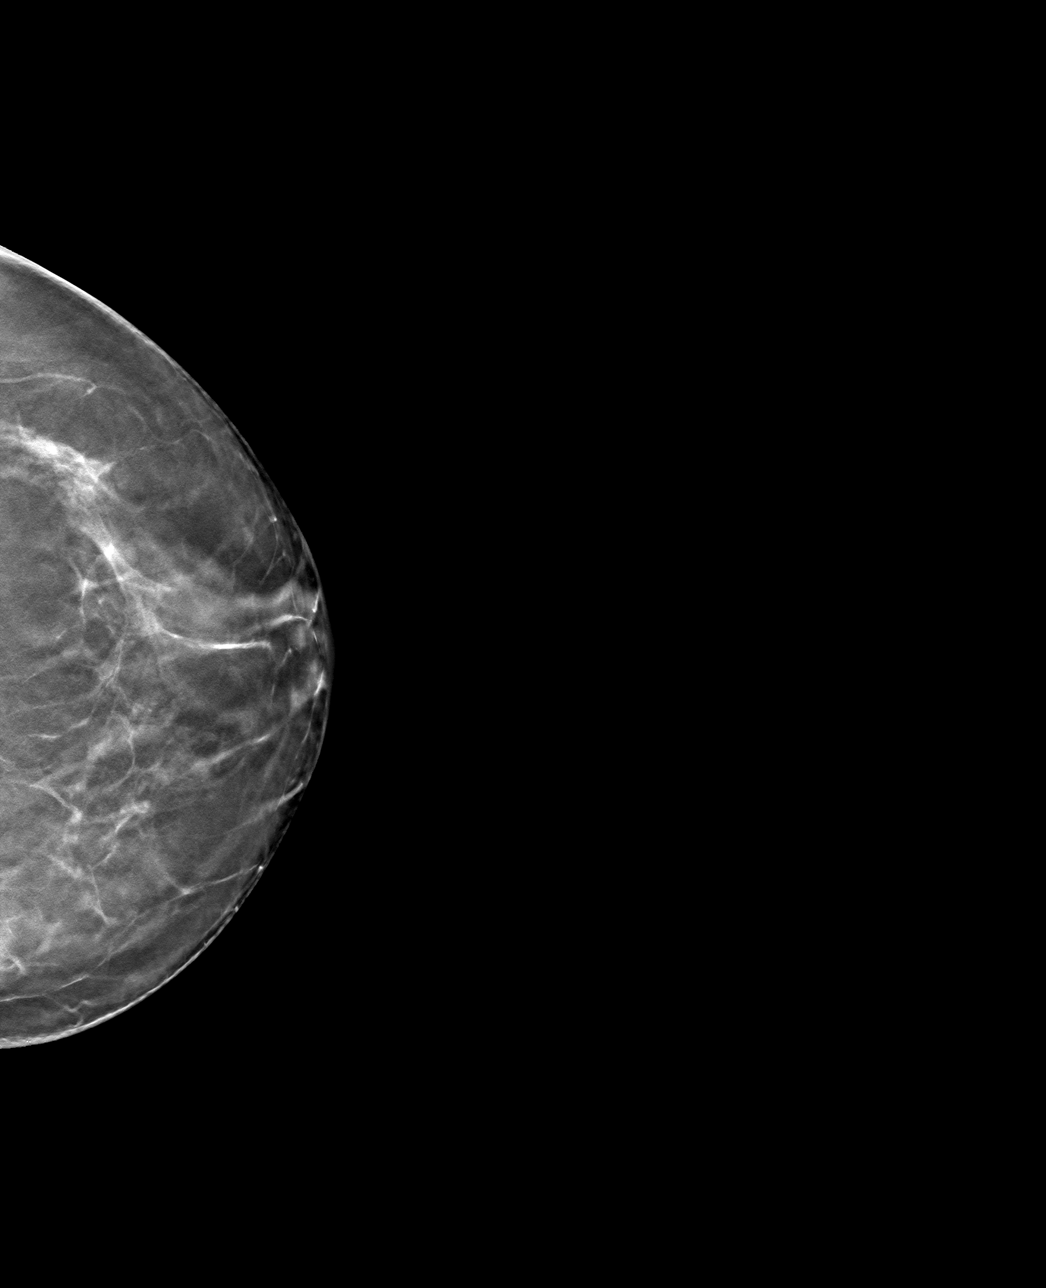

[4 of 12 positions shown; findings below may reference images not displayed]

ACR Breast Density Category c: The breast tissue is heterogeneously
dense, which may obscure small masses.
FINDINGS: There are no findings suspicious for malignancy. Images were
processed with CAD.
IMPRESSION: No mammographic evidence of malignancy. A result letter of this
screening mammogram will be mailed directly to the patient.

RECOMMENDATION:
Screening mammogram in one year. (Code:GZ-3-WLX)

BI-RADS CATEGORY  1: Negative.

## 2020-01-05 ENCOUNTER — Other Ambulatory Visit: Payer: Self-pay | Admitting: Gastroenterology

## 2020-01-18 NOTE — Patient Instructions (Addendum)
Blood work was ordered.    All other Health Maintenance issues reviewed.   All recommended immunizations and age-appropriate screenings are up-to-date or discussed.  Tetanus immunization administered today.   Medications reviewed and updated.  Changes include :  none     Please followup in 6 months    Health Maintenance, Female Adopting a healthy lifestyle and getting preventive care are important in promoting health and wellness. Ask your health care provider about:  The right schedule for you to have regular tests and exams.  Things you can do on your own to prevent diseases and keep yourself healthy. What should I know about diet, weight, and exercise? Eat a healthy diet   Eat a diet that includes plenty of vegetables, fruits, low-fat dairy products, and lean protein.  Do not eat a lot of foods that are high in solid fats, added sugars, or sodium. Maintain a healthy weight Body mass index (BMI) is used to identify weight problems. It estimates body fat based on height and weight. Your health care provider can help determine your BMI and help you achieve or maintain a healthy weight. Get regular exercise Get regular exercise. This is one of the most important things you can do for your health. Most adults should:  Exercise for at least 150 minutes each week. The exercise should increase your heart rate and make you sweat (moderate-intensity exercise).  Do strengthening exercises at least twice a week. This is in addition to the moderate-intensity exercise.  Spend less time sitting. Even light physical activity can be beneficial. Watch cholesterol and blood lipids Have your blood tested for lipids and cholesterol at 55 years of age, then have this test every 5 years. Have your cholesterol levels checked more often if:  Your lipid or cholesterol levels are high.  You are older than 55 years of age.  You are at high risk for heart disease. What should I know about  cancer screening? Depending on your health history and family history, you may need to have cancer screening at various ages. This may include screening for:  Breast cancer.  Cervical cancer.  Colorectal cancer.  Skin cancer.  Lung cancer. What should I know about heart disease, diabetes, and high blood pressure? Blood pressure and heart disease  High blood pressure causes heart disease and increases the risk of stroke. This is more likely to develop in people who have high blood pressure readings, are of African descent, or are overweight.  Have your blood pressure checked: ? Every 3-5 years if you are 30-23 years of age. ? Every year if you are 64 years old or older. Diabetes Have regular diabetes screenings. This checks your fasting blood sugar level. Have the screening done:  Once every three years after age 93 if you are at a normal weight and have a low risk for diabetes.  More often and at a younger age if you are overweight or have a high risk for diabetes. What should I know about preventing infection? Hepatitis B If you have a higher risk for hepatitis B, you should be screened for this virus. Talk with your health care provider to find out if you are at risk for hepatitis B infection. Hepatitis C Testing is recommended for:  Everyone born from [birth year] through 1966.  Anyone with known risk factors for hepatitis C. Sexually transmitted infections (STIs)  Get screened for STIs, including gonorrhea and chlamydia, if: ? You are sexually active and are younger than 55 years of  age. ? You are older than 55 years of age and your health care provider tells you that you are at risk for this type of infection. ? Your sexual activity has changed since you were last screened, and you are at increased risk for chlamydia or gonorrhea. Ask your health care provider if you are at risk.  Ask your health care provider about whether you are at high risk for HIV. Your health care  provider may recommend a prescription medicine to help prevent HIV infection. If you choose to take medicine to prevent HIV, you should first get tested for HIV. You should then be tested every 3 months for as long as you are taking the medicine. Pregnancy  If you are about to stop having your period (premenopausal) and you may become pregnant, seek counseling before you get pregnant.  Take 400 to 800 micrograms (mcg) of folic acid every day if you become pregnant.  Ask for birth control (contraception) if you want to prevent pregnancy. Osteoporosis and menopause Osteoporosis is a disease in which the bones lose minerals and strength with aging. This can result in bone fractures. If you are 25 years old or older, or if you are at risk for osteoporosis and fractures, ask your health care provider if you should:  Be screened for bone loss.  Take a calcium or vitamin D supplement to lower your risk of fractures.  Be given hormone replacement therapy (HRT) to treat symptoms of menopause. Follow these instructions at home: Lifestyle  Do not use any products that contain nicotine or tobacco, such as cigarettes, e-cigarettes, and chewing tobacco. If you need help quitting, ask your health care provider.  Do not use street drugs.  Do not share needles.  Ask your health care provider for help if you need support or information about quitting drugs. Alcohol use  Do not drink alcohol if: ? Your health care provider tells you not to drink. ? You are pregnant, may be pregnant, or are planning to become pregnant.  If you drink alcohol: ? Limit how much you use to 0-1 drink a day. ? Limit intake if you are breastfeeding.  Be aware of how much alcohol is in your drink. In the [place] one drink equals one 12 oz bottle of beer (355 mL), one 5 oz glass of wine (148 mL), or one 1 oz glass of hard liquor (44 mL). General instructions  Schedule regular health, dental, and eye exams.  Stay current  with your vaccines.  Tell your health care provider if: ? You often feel depressed. ? You have ever been abused or do not feel safe at home. Summary  Adopting a healthy lifestyle and getting preventive care are important in promoting health and wellness.  Follow your health care provider's instructions about healthy diet, exercising, and getting tested or screened for diseases.  Follow your health care provider's instructions on monitoring your cholesterol and blood pressure. This information is not intended to replace advice given to you by your health care provider. Make sure you discuss any questions you have with your health care provider. Document Revised: 07/22/2018 Document Reviewed: 07/22/2018 Elsevier Patient Education  2020 [employer].

## 2020-01-18 NOTE — Progress Notes (Signed)
Subjective:    Patient ID: [name], female    DOB: [birth year], 54 y.o.   MRN: [id number]  HPI She is here for a physical exam.   She denies any major changes in health.  Her left plantar fasciitis is acting up, but has improved with the changes she has made.  She walks on the treadmill while she works and can walk anywhere from 2-10 miles a day.  Medications and allergies reviewed with patient and updated if appropriate.  Patient Active Problem List   Diagnosis Date Noted  . Rib pain on left side 06/01/2019  . Prediabetes 03/14/2018  . Cervicogenic headache 03/11/2018  . History of breast cancer 01/24/2017  . Overactive bladder 01/24/2017  . PVC (premature ventricular contraction) 07/15/2016  . Insomnia 01/11/2014  . Anxiety associated with depression 11/09/2013  . Crohn's colitis ([place]) 11/09/2012  . Osteopenia 10/29/2012  . Hypothyroidism 04/22/2012  . GOITER, MULTINODULAR 07/14/2007  . INCONTINENCE, URGE 10/09/2006    Current Outpatient Medications on File Prior to Visit  Medication Sig Dispense Refill  . aspirin 81 MG tablet Take 81 mg by mouth daily as needed.     Marland Kitchen b complex vitamins capsule Take 1 capsule by mouth at bedtime.     . Biotin 5000 MCG CAPS Take 5,000 mcg by mouth 2 (two) times daily.     . Calcium Carb-Cholecalciferol (CALCIUM + D3 PO) Take 1 tablet by mouth at bedtime.     . cetirizine (ZYRTEC) 10 MG tablet Take 10 mg by mouth daily.    Marland Kitchen escitalopram (LEXAPRO) 10 MG tablet TAKE ONE TABLET BY MOUTH DAILY 90 tablet 1  . fluticasone (FLONASE) 50 MCG/ACT nasal spray Place 2 sprays into both nostrils daily. In each nostril as needed (Patient taking differently: Place 2 sprays into both nostrils daily as needed for allergies. ) 16 g 2  . HUMIRA PEN 40 MG/0.4ML PNKT INJECT 40 MG UNDER THE SKIN EVERY 14 DAYS 2 each 3  . hyoscyamine (LEVSIN SL) 0.125 MG SL tablet Use 1 tablet daily as needed for rectal spasms 30 tablet 0  . ibuprofen (ADVIL,MOTRIN) 200 MG  tablet Take 600 mg by mouth every 6 (six) hours as needed for moderate pain.     Marland Kitchen levothyroxine (SYNTHROID) 88 MCG tablet TAKE ONE TABLET BY MOUTH DAILY BEFORE BREAKFAST 90 tablet 1  . MELATONIN PO Take 12 mg by mouth at bedtime.    . Multiple Vitamin (MULTIVITAMIN) tablet Take 1 tablet by mouth daily.    . mupirocin ointment (BACTROBAN) 2 % Place 1 application into the nose 2 (two) times daily. 22 g 1  . Vaginal Lubricant (REPLENS) GEL USE AS DIRECTED (Patient taking differently: One application Daily as needed for dryness) 35 g 0  . VITAMIN E PO Take 1 capsule by mouth daily.      No current facility-administered medications on file prior to visit.    Past Medical History:  Diagnosis Date  . Anxiety   . Asthma   . Chronic headaches   . Granulomatous colitis ([place])   . History of bone density study    no osteopenia  . History of MRI of cervical spine 4/03   mild DJD  . HX: breast cancer 11/98 and 12/03   recurrance  . Hypothyroidism   . IBS (irritable bowel syndrome)   . Inguinal hernia   . Personal history of chemotherapy   . Personal history of radiation therapy     Past Surgical History:  Procedure Laterality Date  . INGUINAL HERNIA REPAIR Right   . MASTECTOMY Right   . TOTAL ABDOMINAL HYSTERECTOMY W/ BILATERAL SALPINGOOPHORECTOMY  2007   left cervix in place    Social History   Socioeconomic History  . Marital status: Single    Spouse name: Not on file  . Number of children: 0  . Years of education: Not on file  . Highest education level: Not on file  Occupational History  . Occupation: managed care    Employer: [employer]  Tobacco Use  . Smoking status: Never Smoker  . Smokeless tobacco: Never Used  Substance and Sexual Activity  . Alcohol use: Yes    Comment: occas  . Drug use: No  . Sexual activity: Not on file  Other Topics Concern  . Not on file  Social History Narrative   Exercise; runs   Social Determinants of Health   Financial Resource  Strain:   . Difficulty of Paying Living Expenses:   Food Insecurity:   . Worried About Charity fundraiser in the Last Year:   . Arboriculturist in the Last Year:   Transportation Needs:   . Film/video editor (Medical):   Marland Kitchen Lack of Transportation (Non-Medical):   Physical Activity:   . Days of Exercise per Week:   . Minutes of Exercise per Session:   Stress:   . Feeling of Stress :   Social Connections:   . Frequency of Communication with Friends and Family:   . Frequency of Social Gatherings with Friends and Family:   . Attends Religious Services:   . Active Member of Clubs or Organizations:   . Attends Archivist Meetings:   Marland Kitchen Marital Status:     Family History  Problem Relation Age of Onset  . Hypertension Mother   . Colon polyps Mother   . Irritable bowel syndrome Mother   . Skin cancer Father   . Melanoma Father   . Breast cancer Paternal Aunt   . Colon polyps Brother   . Psoriasis Brother   . Dermatomyositis Brother   . Diabetes Neg Hx   . Heart disease Neg Hx   . Stroke Neg Hx     Review of Systems  Constitutional: Negative for chills and fever.  HENT: Positive for nosebleeds (intermittent - has seen ENT).   Eyes: Negative for visual disturbance.  Respiratory: Negative for cough, shortness of breath and wheezing.   Cardiovascular: Negative for chest pain, palpitations and leg swelling.  Gastrointestinal: Positive for blood in stool (occ). Negative for abdominal pain (occ discomfort), constipation, diarrhea and nausea.  Genitourinary: Negative for dysuria and hematuria.  Musculoskeletal: Negative for arthralgias.       Plantar fasciitis  Skin: Negative for rash.  Neurological: Positive for headaches.  Psychiatric/Behavioral: Positive for dysphoric mood (controlled). The patient is nervous/anxious (controlled).        Objective:   Vitals:   01/19/20 1051  BP: 102/78  Pulse: 71  Temp: 98.1 F (36.7 C)  SpO2: 94%   Filed Weights    01/19/20 1051  Weight: 152 lb 6.4 oz (69.1 kg)   Body mass index is 24.6 kg/m.  BP Readings from Last 3 Encounters:  01/19/20 102/78  10/01/19 90/70  06/01/19 114/72    Wt Readings from Last 3 Encounters:  01/19/20 152 lb 6.4 oz (69.1 kg)  10/01/19 158 lb (71.7 kg)  06/01/19 157 lb (71.2 kg)     Physical Exam Constitutional: She appears  well-developed and well-nourished. No distress.  HENT:  Head: Normocephalic and atraumatic.  Right Ear: External ear normal. Normal ear canal and TM Left Ear: External ear normal.  Normal ear canal and TM Mouth/Throat: Oropharynx is clear and moist.  Eyes: Conjunctivae and EOM are normal.  Neck: Neck supple. No tracheal deviation present. No thyromegaly present.  No carotid bruit  Cardiovascular: Normal rate, regular rhythm and normal heart sounds.   No murmur heard.  No edema. Pulmonary/Chest: Effort normal and breath sounds normal. No respiratory distress. She has no wheezes. She has no rales.  Breast: deferred   Abdominal: Soft. She exhibits no distension. There is no tenderness.  Lymphadenopathy: She has no cervical adenopathy.  Skin: Skin is warm and dry. She is not diaphoretic.  Psychiatric: She has a normal mood and affect. Her behavior is normal.        Assessment & Plan:   Physical exam: Screening blood work    ordered Immunizations  tdap today, discussed shingrix-she is unsure if she has had chickenpox-we will check titers Colonoscopy  Up to date  Mammogram  Up to date  Dexa  Up to date   Gyn  Not up to date Eye exams  Up to date  Exercise  Walks daily Weight  Normal BMI Substance abuse  none  See Problem List for Assessment and Plan of chronic medical problems.      This visit occurred during the SARS-CoV-2 public health emergency.  Safety protocols were in place, including screening questions prior to the visit, additional usage of staff PPE, and extensive cleaning of exam room while observing appropriate contact  time as indicated for disinfecting solutions.

## 2020-01-19 ENCOUNTER — Ambulatory Visit (INDEPENDENT_AMBULATORY_CARE_PROVIDER_SITE_OTHER): Payer: Managed Care, Other (non HMO) | Admitting: Internal Medicine

## 2020-01-19 ENCOUNTER — Encounter: Payer: Self-pay | Admitting: Internal Medicine

## 2020-01-19 ENCOUNTER — Other Ambulatory Visit: Payer: Self-pay

## 2020-01-19 VITALS — BP 102/78 | HR 71 | Temp 98.1°F | Ht 66.0 in | Wt 152.4 lb

## 2020-01-19 DIAGNOSIS — Z0184 Encounter for antibody response examination: Secondary | ICD-10-CM

## 2020-01-19 DIAGNOSIS — F418 Other specified anxiety disorders: Secondary | ICD-10-CM

## 2020-01-19 DIAGNOSIS — Z0000 Encounter for general adult medical examination without abnormal findings: Secondary | ICD-10-CM | POA: Diagnosis not present

## 2020-01-19 DIAGNOSIS — Z23 Encounter for immunization: Secondary | ICD-10-CM

## 2020-01-19 DIAGNOSIS — R7303 Prediabetes: Secondary | ICD-10-CM | POA: Diagnosis not present

## 2020-01-19 DIAGNOSIS — E039 Hypothyroidism, unspecified: Secondary | ICD-10-CM

## 2020-01-19 DIAGNOSIS — M8580 Other specified disorders of bone density and structure, unspecified site: Secondary | ICD-10-CM | POA: Diagnosis not present

## 2020-01-19 LAB — COMPREHENSIVE METABOLIC PANEL
ALT: 16 U/L (ref 0–35)
AST: 22 U/L (ref 0–37)
Albumin: 4.6 g/dL (ref 3.5–5.2)
Alkaline Phosphatase: 73 U/L (ref 39–117)
BUN: 17 mg/dL (ref 6–23)
CO2: 33 mEq/L — ABNORMAL HIGH (ref 19–32)
Calcium: 9.9 mg/dL (ref 8.4–10.5)
Chloride: 102 mEq/L (ref 96–112)
Creatinine, Ser: 0.63 mg/dL (ref 0.40–1.20)
GFR: 98.25 mL/min (ref >60.00)
Glucose, Bld: 97 mg/dL (ref 70–99)
Potassium: 4.1 mEq/L (ref 3.5–5.1)
Sodium: 140 mEq/L (ref 135–145)
Total Bilirubin: 0.3 mg/dL (ref 0.2–1.2)
Total Protein: 7.1 g/dL (ref 6.0–8.3)

## 2020-01-19 LAB — CBC WITH DIFFERENTIAL/PLATELET
Basophils Absolute: 0 10*3/uL (ref 0.0–0.1)
Basophils Relative: 0.8 % (ref 0.0–3.0)
Eosinophils Absolute: 0.2 10*3/uL (ref 0.0–0.7)
Eosinophils Relative: 3.3 % (ref 0.0–5.0)
HCT: 39.8 % (ref 36.0–46.0)
Hemoglobin: 13 g/dL (ref 12.0–15.0)
Lymphocytes Relative: 35.3 % (ref 12.0–46.0)
Lymphs Abs: 1.8 10*3/uL (ref 0.7–4.0)
MCHC: 32.6 g/dL (ref 30.0–36.0)
MCV: 84.5 fl (ref 78.0–100.0)
Monocytes Absolute: 0.4 10*3/uL (ref 0.1–1.0)
Monocytes Relative: 8.2 % (ref 3.0–12.0)
Neutro Abs: 2.6 10*3/uL (ref 1.4–7.7)
Neutrophils Relative %: 52.4 % (ref 43.0–77.0)
Platelets: 191 10*3/uL (ref 150.0–400.0)
RBC: 4.71 Mil/uL (ref 3.87–5.11)
RDW: 13.1 % (ref 11.5–15.5)
WBC: 5 10*3/uL (ref 4.0–10.5)

## 2020-01-19 LAB — LIPID PANEL
Cholesterol: 213 mg/dL — ABNORMAL HIGH (ref 0–200)
HDL: 64.6 mg/dL (ref >39.00)
LDL Cholesterol: 133 mg/dL — ABNORMAL HIGH (ref 0–99)
NonHDL: 148.56
Total CHOL/HDL Ratio: 3
Triglycerides: 77 mg/dL (ref 0.0–149.0)
VLDL: 15.4 mg/dL (ref 0.0–40.0)

## 2020-01-19 LAB — TSH: TSH: 0.67 u[IU]/mL (ref 0.35–4.50)

## 2020-01-19 MED ORDER — ESCITALOPRAM OXALATE 10 MG PO TABS: 10.0000 mg | ORAL_TABLET | Freq: Every day | ORAL | 1 refills | Status: DC

## 2020-01-19 MED ORDER — LEVOTHYROXINE SODIUM 88 MCG PO TABS: ORAL_TABLET | ORAL | 1 refills | Status: DC

## 2020-01-19 NOTE — Assessment & Plan Note (Signed)
Chronic Controlled, stable Continue current dose of medication lexparo 10 mg daily

## 2020-01-19 NOTE — Assessment & Plan Note (Signed)
Chronic DEXA up-to-date Walking regularly Taking calcium and vitamin D

## 2020-01-19 NOTE — Assessment & Plan Note (Signed)
Chronic  Clinically euthyroid Check tsh  Titrate med dose if needed  

## 2020-01-19 NOTE — Assessment & Plan Note (Signed)
Chronic Check a1c Low sugar / carb diet Stressed regular exercise  

## 2020-01-19 NOTE — Addendum Note (Signed)
Addended by: Marcina Millard on: 01/19/2020 01:21 PM   Modules accepted: Orders

## 2020-01-20 LAB — VARICELLA ZOSTER ANTIBODY, IGG: Varicella IgG: 3321 index

## 2020-02-21 ENCOUNTER — Inpatient Hospital Stay: Payer: Managed Care, Other (non HMO) | Attending: Oncology | Admitting: Oncology

## 2020-02-21 ENCOUNTER — Other Ambulatory Visit: Payer: Self-pay

## 2020-02-21 VITALS — BP 99/81 | HR 61 | Temp 98.1°F | Resp 17 | Ht 66.0 in | Wt 154.8 lb

## 2020-02-21 DIAGNOSIS — Z170 Estrogen receptor positive status [ER+]: Secondary | ICD-10-CM | POA: Insufficient documentation

## 2020-02-21 DIAGNOSIS — Z9221 Personal history of antineoplastic chemotherapy: Secondary | ICD-10-CM | POA: Insufficient documentation

## 2020-02-21 DIAGNOSIS — Z853 Personal history of malignant neoplasm of breast: Secondary | ICD-10-CM | POA: Diagnosis not present

## 2020-02-21 DIAGNOSIS — Z9223 Personal history of estrogen therapy: Secondary | ICD-10-CM | POA: Diagnosis not present

## 2020-02-21 DIAGNOSIS — Z9011 Acquired absence of right breast and nipple: Secondary | ICD-10-CM | POA: Insufficient documentation

## 2020-02-21 NOTE — Progress Notes (Signed)
  [facility] OFFICE PROGRESS NOTE   Diagnosis: Breast cancer  INTERVAL HISTORY:   [name] returns as scheduled.  A left mammogram 09/06/2019 was negative.  She feels well.  She continues follow-up with Dr. Silverio [name] for management of colitis.  She has received the COVID-19 vaccine.  No palpable change over the right chest or left breast.  Objective:  Vital signs in last 24 hours:  Blood pressure 99/81, pulse 61, temperature 98.1 F (36.7 C), temperature source Temporal, resp. rate 17, height 5' 6"  (1.676 m), weight 154 lb 12.8 oz (70.2 kg), SpO2 99 %.    HEENT: Neck without mass Lymphatics: No cervical, supraclavicular, or axillary nodes Resp: Lungs clear bilaterally Cardio: Regular rate and rhythm GI: No hepatomegaly Vascular: No leg edema Breast: Status post right mastectomy with a TRAM reconstruction.  No evidence for chest wall tumor recurrence.  Left breast without mass.  There is a 1 cm oval cutaneous lesion at the 11 o'clock position of the left breast medial to 2 moles, the lesion is mobile Skin: Hyperpigmented moles and cherry hemangiomas over the trunk, 1 cm tan/purple mole at the right upper back   Lab Results:  Lab Results  Component Value Date   WBC 5.0 01/19/2020   HGB 13.0 01/19/2020   HCT 39.8 01/19/2020   MCV 84.5 01/19/2020   PLT 191.0 01/19/2020   NEUTROABS 2.6 01/19/2020    CMP  Lab Results  Component Value Date   NA 140 01/19/2020   K 4.1 01/19/2020   CL 102 01/19/2020   CO2 33 (H) 01/19/2020   GLUCOSE 97 01/19/2020   BUN 17 01/19/2020   CREATININE 0.63 01/19/2020   CALCIUM 9.9 01/19/2020   PROT 7.1 01/19/2020   ALBUMIN 4.6 01/19/2020   AST 22 01/19/2020   ALT 16 01/19/2020   ALKPHOS 73 01/19/2020   BILITOT 0.3 01/19/2020   GFRNONAA >60 03/30/2011   GFRAA >60 03/30/2011     Medications: I have reviewed the patient's current medications.   Assessment/Plan: [name] was diagnosed with right-sided breast cancer in  November 1998. She developed a right breast recurrence in December 2003 and underwent a right mastectomy/TRAM reconstruction. She completed adjuvant AC and Taxol chemotherapy. She took tamoxifen August 2004 through July 2009. She began Femara 04/12/2008, discontinued at the end of 2014.   Disposition: [name] is in clinical remission from breast cancer.  She will be scheduled for a yearly mammogram.  The superficial lesion at the medial left breast is likely a benign finding.  She will call if this area changes.  She will be scheduled for an office visit in 1 year.  I encouraged her to follow-up with gynecology for a Pap smear as she had a supra cervical hysterectomy.  I recommended she follow-up with dermatology for a skin exam to include evaluation of the right upper back mole.  [name] Coder, MD  02/21/2020  3:48 PM

## 2020-02-22 ENCOUNTER — Telehealth: Payer: Self-pay | Admitting: Hematology and Oncology

## 2020-02-22 NOTE — Telephone Encounter (Signed)
Scheduled appts per 7/12 los. Pt confirmed appt date and time.

## 2020-02-23 ENCOUNTER — Other Ambulatory Visit: Payer: Self-pay | Admitting: Internal Medicine

## 2020-02-23 MED ORDER — LEVOTHYROXINE SODIUM 88 MCG PO TABS: ORAL_TABLET | ORAL | 1 refills | Status: DC

## 2020-02-23 MED ORDER — ESCITALOPRAM OXALATE 10 MG PO TABS: 10.0000 mg | ORAL_TABLET | Freq: Every day | ORAL | 1 refills | Status: DC

## 2020-04-04 ENCOUNTER — Ambulatory Visit: Payer: Managed Care, Other (non HMO) | Attending: Internal Medicine

## 2020-04-04 DIAGNOSIS — Z23 Encounter for immunization: Secondary | ICD-10-CM

## 2020-04-04 NOTE — Progress Notes (Signed)
   Covid-19 Vaccination Clinic  Name:  [name]    MRN: [id number] DOB: 1966-03-10  04/04/2020  [name] was observed post Covid-19 immunization for 15 minutes without incident. She was provided with Vaccine Information Sheet and instruction to access the V-Safe system.   [name] was instructed to call 911 with any severe reactions post vaccine: Marland Kitchen Difficulty breathing  . Swelling of face and throat  . A fast heartbeat  . A bad rash all over body  . Dizziness and weakness

## 2020-07-28 ENCOUNTER — Other Ambulatory Visit: Payer: Self-pay | Admitting: Internal Medicine

## 2020-07-28 DIAGNOSIS — Z1231 Encounter for screening mammogram for malignant neoplasm of breast: Secondary | ICD-10-CM

## 2020-08-14 ENCOUNTER — Other Ambulatory Visit: Payer: Self-pay

## 2020-08-14 MED ORDER — HUMIRA (2 PEN) 40 MG/0.4ML ~~LOC~~ AJKT: AUTO-INJECTOR | SUBCUTANEOUS | 3 refills | Status: DC

## 2020-08-15 ENCOUNTER — Telehealth: Payer: Self-pay

## 2020-08-15 NOTE — Telephone Encounter (Signed)
New insurance PA started for Humira 40 mg/0.74m pen

## 2020-08-21 ENCOUNTER — Ambulatory Visit: Payer: 59 | Admitting: Gastroenterology

## 2020-08-21 ENCOUNTER — Other Ambulatory Visit (INDEPENDENT_AMBULATORY_CARE_PROVIDER_SITE_OTHER): Payer: 59

## 2020-08-21 ENCOUNTER — Other Ambulatory Visit: Payer: Self-pay

## 2020-08-21 ENCOUNTER — Encounter: Payer: Self-pay | Admitting: Gastroenterology

## 2020-08-21 VITALS — BP 96/60 | HR 74 | Ht 66.0 in | Wt 160.0 lb

## 2020-08-21 DIAGNOSIS — R140 Abdominal distension (gaseous): Secondary | ICD-10-CM | POA: Diagnosis not present

## 2020-08-21 DIAGNOSIS — K580 Irritable bowel syndrome with diarrhea: Secondary | ICD-10-CM

## 2020-08-21 DIAGNOSIS — K5080 Crohn's disease of both small and large intestine without complications: Secondary | ICD-10-CM

## 2020-08-21 DIAGNOSIS — T452X1S Poisoning by vitamins, accidental (unintentional), sequela: Secondary | ICD-10-CM

## 2020-08-21 LAB — COMPREHENSIVE METABOLIC PANEL
ALT: 14 U/L (ref 0–35)
AST: 17 U/L (ref 0–37)
Albumin: 4.8 g/dL (ref 3.5–5.2)
Alkaline Phosphatase: 64 U/L (ref 39–117)
BUN: 16 mg/dL (ref 6–23)
CO2: 30 mEq/L (ref 19–32)
Calcium: 10.1 mg/dL (ref 8.4–10.5)
Chloride: 102 mEq/L (ref 96–112)
Creatinine, Ser: 0.85 mg/dL (ref 0.40–1.20)
GFR: 77.25 mL/min (ref >60.00)
Glucose, Bld: 98 mg/dL (ref 70–99)
Potassium: 3.9 mEq/L (ref 3.5–5.1)
Sodium: 138 mEq/L (ref 135–145)
Total Bilirubin: 0.3 mg/dL (ref 0.2–1.2)
Total Protein: 7.3 g/dL (ref 6.0–8.3)

## 2020-08-21 LAB — CBC WITH DIFFERENTIAL/PLATELET
Basophils Absolute: 0.1 10*3/uL (ref 0.0–0.1)
Basophils Relative: 1.3 % (ref 0.0–3.0)
Eosinophils Absolute: 0.2 10*3/uL (ref 0.0–0.7)
Eosinophils Relative: 3.9 % (ref 0.0–5.0)
HCT: 39.3 % (ref 36.0–46.0)
Hemoglobin: 13 g/dL (ref 12.0–15.0)
Lymphocytes Relative: 39.2 % (ref 12.0–46.0)
Lymphs Abs: 2.5 10*3/uL (ref 0.7–4.0)
MCHC: 33 g/dL (ref 30.0–36.0)
MCV: 83.1 fl (ref 78.0–100.0)
Monocytes Absolute: 0.5 10*3/uL (ref 0.1–1.0)
Monocytes Relative: 8.3 % (ref 3.0–12.0)
Neutro Abs: 3 10*3/uL (ref 1.4–7.7)
Neutrophils Relative %: 47.3 % (ref 43.0–77.0)
Platelets: 208 10*3/uL (ref 150.0–400.0)
RBC: 4.73 Mil/uL (ref 3.87–5.11)
RDW: 12.8 % (ref 11.5–15.5)
WBC: 6.3 10*3/uL (ref 4.0–10.5)

## 2020-08-21 LAB — VITAMIN D 25 HYDROXY (VIT D DEFICIENCY, FRACTURES): VITD: >120 ng/mL

## 2020-08-21 MED ORDER — COLESTIPOL HCL 1 G PO TABS: 1.0000 g | ORAL_TABLET | Freq: Two times a day (BID) | ORAL | 11 refills | Status: DC

## 2020-08-21 MED ORDER — DICYCLOMINE HCL 10 MG PO CAPS: 10.0000 mg | ORAL_CAPSULE | Freq: Three times a day (TID) | ORAL | 3 refills | Status: DC

## 2020-08-21 NOTE — Patient Instructions (Addendum)
Your provider has requested that you go to the basement level for lab work before leaving today. Press "B" on the elevator. The lab is located at the first door on the left as you exit the elevator.  Follow up in 1 year  We will send Colestid and dicyclomine to your pharmacy  Due to recent changes in healthcare laws, you may see the results of your imaging and laboratory studies on MyChart before your provider has had a chance to review them.  We understand that in some cases there may be results that are confusing or concerning to you. Not all laboratory results come back in the same time frame and the provider may be waiting for multiple results in order to interpret others.  Please give [place] 48 hours in order for your provider to thoroughly review all the results before contacting the office for clarification of your results.   If you are age 27 or older, your body mass index should be between 23-30. Your Body mass index is 25.82 kg/m. If this is out of the aforementioned range listed, please consider follow up with your Primary Care Provider.  If you are age 33 or younger, your body mass index should be between 19-25. Your Body mass index is 25.82 kg/m. If this is out of the aformentioned range listed, please consider follow up with your Primary Care Provider.    I appreciate the  opportunity to care for you  Thank You   Harl Bowie , MD

## 2020-08-21 NOTE — Progress Notes (Signed)
[name]    [id number]    December 03, 1965  Primary Care Physician:Burns, Claudina Lick, MD  Referring Physician: Binnie Rail, MD [place],  [place] [address]   Chief complaint: Crohn's disease    HPI:  55 year old very pleasant female with h/o crohn's colitis initially diagnosed in 2013 , is here for follow up visit for Crohn's disease She is in clinical remission on Humira 64m every 2 weeks but she does feel mild symptoms for few days before she is due for her Humira injection  She is having increased fecal urgency and watery diarrhea on average once or twice a month, diarrhea is worse when she eats spicy food, had chili last night and she feels that is causing her to have increased bowel frequency.  She notices small-volume bright red blood per rectum when she has multiple bowel movements.  On most days she has 2-3 soft bowel movements but when she has episodes of diarrhea it can range from 5-7 bowel movements but usually resolves within a day or so. She takes ibuprofen intermittently. No longer having rectal spasms.   She received both doses of Covid vaccination and booster dose.  She also is immunized for influenza  Colonoscopy 02/17/2017: Random biopsies from ileum and colon showed no active inflammation or dysplasia.   Bone density scan September 06, 2019: Showed osteopenia T score -1.9  Adalimumab drug trough and antibody level October 12, 2019: 8.3 [at therapeutic target] and undetectable respectively   Outpatient Encounter Medications as of 08/21/2020  Medication Sig  . Adalimumab (HUMIRA PEN) 40 MG/0.4ML PNKT INJECT 40 MG UNDER THE SKIN EVERY 14 DAYS K50.10  . b complex vitamins capsule Take 1 capsule by mouth at bedtime.   . Biotin 5000 MCG CAPS Take 5,000 mcg by mouth 2 (two) times daily.   . Calcium Carb-Cholecalciferol (CALCIUM + D3 PO) Take 1 tablet by mouth at bedtime.   . cetirizine (ZYRTEC) 10 MG tablet Take 10 mg by mouth daily.  .Marland Kitchen escitalopram (LEXAPRO) 10 MG tablet Take 1 tablet (10 mg total) by mouth daily.  . fluticasone (FLONASE) 50 MCG/ACT nasal spray Place 2 sprays into both nostrils daily. In each nostril as needed (Patient taking differently: Place 2 sprays into both nostrils daily as needed for allergies. )  . hyoscyamine (LEVSIN SL) 0.125 MG SL tablet Use 1 tablet daily as needed for rectal spasms (Patient not taking: Reported on 02/21/2020)  . ibuprofen (ADVIL,MOTRIN) 200 MG tablet Take 600 mg by mouth every 6 (six) hours as needed for moderate pain.   .Marland Kitchenlevothyroxine (SYNTHROID) 88 MCG tablet TAKE ONE TABLET BY MOUTH DAILY BEFORE BREAKFAST  . MELATONIN PO Take 12 mg by mouth at bedtime.  . Multiple Vitamin (MULTIVITAMIN) tablet Take 1 tablet by mouth daily.  .Marland KitchenVITAMIN E PO Take 1 capsule by mouth daily.    No facility-administered encounter medications on file as of 08/21/2020.    Allergies as of 08/21/2020 - Review Complete 02/21/2020  Allergen Reaction Noted  . Sulfa antibiotics Rash 09/16/2011    Past Medical History:  Diagnosis Date  . Anxiety   . Asthma   . Chronic headaches   . Granulomatous colitis ([place]   . History of bone density study    no osteopenia  . History of MRI of cervical spine 4/03   mild DJD  . HX: breast cancer 11/98 and 12/03   recurrance  . Hypothyroidism   .  IBS (irritable bowel syndrome)   . Inguinal hernia   . Personal history of chemotherapy   . Personal history of radiation therapy     Past Surgical History:  Procedure Laterality Date  . INGUINAL HERNIA REPAIR Right   . MASTECTOMY Right   . TOTAL ABDOMINAL HYSTERECTOMY W/ BILATERAL SALPINGOOPHORECTOMY  2007   left cervix in place    Family History  Problem Relation Age of Onset  . Hypertension Mother   . Colon polyps Mother   . Irritable bowel syndrome Mother   . Skin cancer Father   . Melanoma Father   . Breast cancer Paternal Aunt   . Colon polyps Brother   . Psoriasis Brother   . Dermatomyositis  Brother   . Diabetes Neg Hx   . Heart disease Neg Hx   . Stroke Neg Hx     Social History   Socioeconomic History  . Marital status: Single    Spouse name: Not on file  . Number of children: 0  . Years of education: Not on file  . Highest education level: Not on file  Occupational History  . Occupation: managed care    Employer: [employer]  Tobacco Use  . Smoking status: Never Smoker  . Smokeless tobacco: Never Used  Substance and Sexual Activity  . Alcohol use: Yes    Comment: occas  . Drug use: No  . Sexual activity: Not on file  Other Topics Concern  . Not on file  Social History Narrative   Exercise; runs   Social Determinants of Health   Financial Resource Strain: Not on file  Food Insecurity: Not on file  Transportation Needs: Not on file  Physical Activity: Not on file  Stress: Not on file  Social Connections: Not on file  Intimate Partner Violence: Not on file      Review of systems: All other review of systems negative except as mentioned in the HPI.   Physical Exam: There were no vitals filed for this visit. There is no height or weight on file to calculate BMI. Gen:      No acute distress HEENT:  sclera anicteric Abd:      soft, non-tender; no palpable masses, no distension Ext:    No edema Neuro: alert and oriented x 3 Psych: normal mood and affect  Data Reviewed:  Reviewed labs, radiology imaging, old records and pertinent past GI work up   Assessment and Plan/Recommendations:  55 year old very pleasant female female with history of Crohn's colitis initial diagnosis in 2013, in clinical remission on Humira 40 mg every 2 weeks  Colonoscopy July 2018 with no active inflammation  Intermittent diarrhea and fecal incontinence with fecal urgency: Likely secondary to IBS diarrhea and food intolerance Advised patient to do trial of lactose-free diet for a week, okay to use Lactaid pills as needed if she consumes increase milk or dairy  products Maintain food symptom diary to identify potential food intolerance  Trial of Colestid 1 g twice daily for possible bile salt induced diarrhea, advised patient to titrate the dose based on response to have 1-2 soft bowel movement daily  IBS with abdominal bloating: Use dicyclomine 10 mg up to 3 times daily as needed  IBD health maintenance: Bone density scan showed osteopenia, check vitamin D level  up-to-date with preventative screening  up-to-date with immunizations  Due for surveillance colonoscopy July 2023.  If continues to have persistent symptoms and blood in stool, will consider colonoscopy earlier for further evaluation  This visit required 45 minutes of patient care (this includes precharting, chart review, review of results, face-to-face time used for counseling as well as treatment plan and follow-up. The patient was provided an opportunity to ask questions and all were answered. The patient agreed with the plan and demonstrated an understanding of the instructions.  Damaris Hippo , MD    CC: Binnie Rail, MD

## 2020-08-22 ENCOUNTER — Telehealth: Payer: Self-pay | Admitting: Gastroenterology

## 2020-08-23 ENCOUNTER — Other Ambulatory Visit: Payer: Self-pay | Admitting: Internal Medicine

## 2020-08-23 LAB — QUANTIFERON-TB GOLD PLUS
Mitogen-NIL: >10 IU/mL
NIL: 0.04 IU/mL
QuantiFERON-TB Gold Plus: NEGATIVE
TB1-NIL: 0 IU/mL
TB2-NIL: 0 IU/mL

## 2020-08-23 NOTE — Telephone Encounter (Signed)
PA was initiated 08/15/20 via faxed forms from [facility] using the new insurance the patient provided. PA sent through Cover My Meds today as requested.

## 2020-08-24 ENCOUNTER — Other Ambulatory Visit: Payer: Self-pay

## 2020-08-24 MED ORDER — HUMIRA (2 PEN) 40 MG/0.4ML ~~LOC~~ AJKT: AUTO-INJECTOR | SUBCUTANEOUS | 3 refills | Status: DC

## 2020-08-24 NOTE — Telephone Encounter (Signed)
Humira approved.

## 2020-09-07 ENCOUNTER — Ambulatory Visit
Admission: RE | Admit: 2020-09-07 | Discharge: 2020-09-07 | Disposition: A | Discharge status: Home / Self Care | Payer: Managed Care, Other (non HMO) | Source: Ambulatory Visit | Attending: Internal Medicine | Admitting: Internal Medicine

## 2020-09-07 ENCOUNTER — Other Ambulatory Visit: Payer: Self-pay

## 2020-09-07 DIAGNOSIS — Z1231 Encounter for screening mammogram for malignant neoplasm of breast: Secondary | ICD-10-CM

## 2020-09-16 IMAGING — DX DG RIBS 2V*L*
2 series · 2 of 2 positions shown · non-contrast
Comparison: None.

CLINICAL DATA: Pain for approximately 1 week

EXAM:
LEFT RIBS - 2 VIEW

[rib pa]
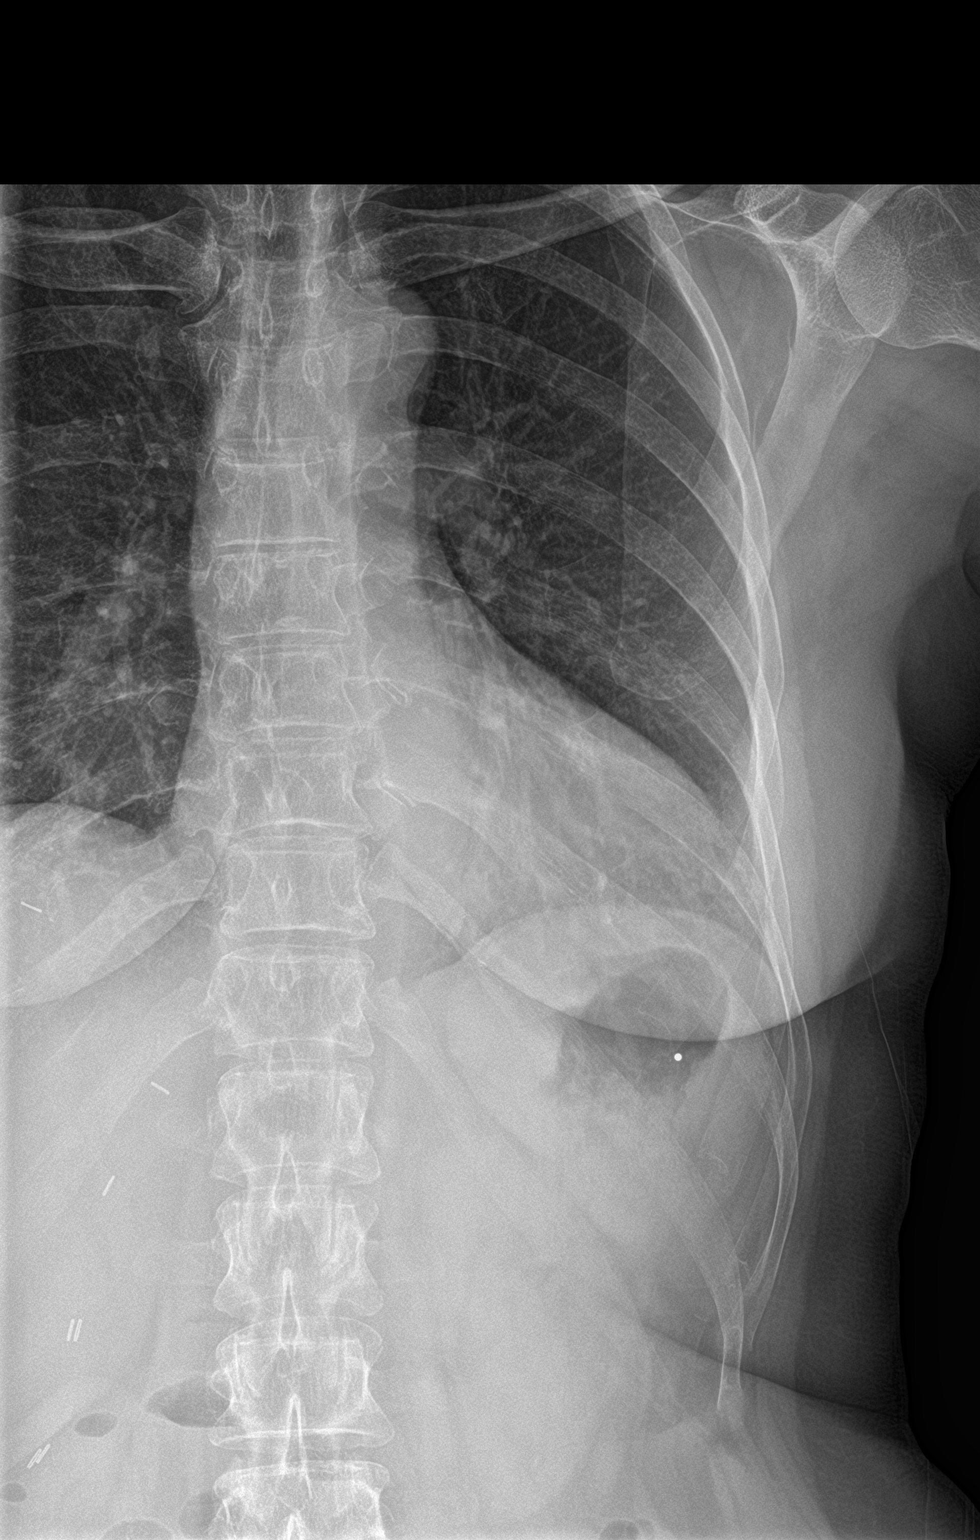

[rib obl]
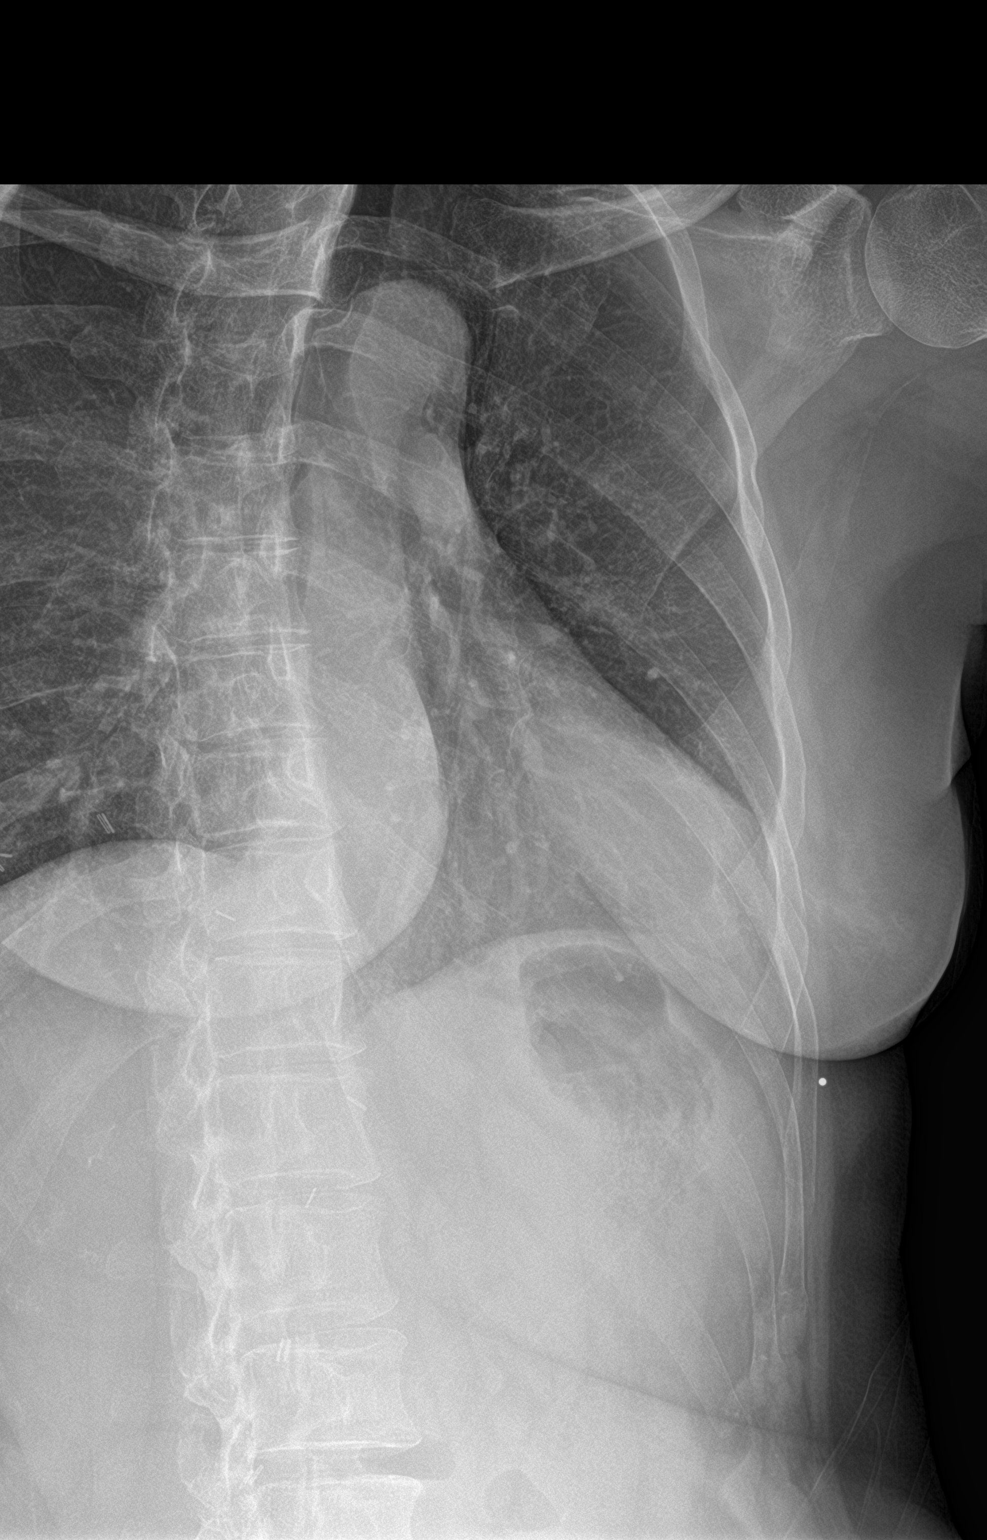

[2 of 2 positions shown; findings below may reference images not displayed]

FINDINGS: Frontal and oblique images obtained. There are prior fractures of
the anterior left third and left sixth ribs with remodeling. No
acute appearing fracture is evident. Left lung is clear. Heart size
normal. Pulmonary vascular is normal. No pneumothorax or pleural
effusion.
IMPRESSION: Prior healed fractures on the left. No acute fracture is
demonstrable. No pneumothorax on the left. Left lung clear. Cardiac
silhouette within normal limits.

## 2020-12-07 ENCOUNTER — Other Ambulatory Visit: Payer: Self-pay | Admitting: Gastroenterology

## 2020-12-14 ENCOUNTER — Other Ambulatory Visit: Payer: Self-pay

## 2020-12-14 ENCOUNTER — Other Ambulatory Visit (HOSPITAL_BASED_OUTPATIENT_CLINIC_OR_DEPARTMENT_OTHER): Payer: Self-pay

## 2020-12-14 ENCOUNTER — Ambulatory Visit: Payer: 59 | Attending: Internal Medicine

## 2020-12-14 DIAGNOSIS — Z23 Encounter for immunization: Secondary | ICD-10-CM

## 2020-12-14 MED ORDER — PFIZER-BIONT COVID-19 VAC-TRIS 30 MCG/0.3ML IM SUSP
INTRAMUSCULAR | 0 refills | Status: DC
  Filled 2020-12-14: qty 0.3, 1d supply, fill #0

## 2020-12-14 NOTE — Progress Notes (Signed)
   Covid-19 Vaccination Clinic  Name:  [name]    MRN: [id number] DOB: 10/13/1965  12/14/2020  [name] was observed post Covid-19 immunization for 15 minutes without incident. She was provided with Vaccine Information Sheet and instruction to access the V-Safe system.   [name] was instructed to call 911 with any severe reactions post vaccine: Marland Kitchen Difficulty breathing  . Swelling of face and throat  . A fast heartbeat  . A bad rash all over body  . Dizziness and weakness   Immunizations Administered    Name Date Dose VIS Date Route   PFIZER Comrnaty(Gray TOP) Covid-19 Vaccine 12/14/2020  1:59 PM 0.3 mL 07/20/2020 Intramuscular   Manufacturer: [employer]   Lot: [id number]   NDC: [phone]

## 2020-12-25 ENCOUNTER — Encounter: Payer: Self-pay | Admitting: Internal Medicine

## 2020-12-26 ENCOUNTER — Other Ambulatory Visit: Payer: Self-pay

## 2020-12-26 MED ORDER — LEVOTHYROXINE SODIUM 88 MCG PO TABS: ORAL_TABLET | ORAL | 0 refills | Status: DC

## 2021-02-19 ENCOUNTER — Other Ambulatory Visit: Payer: Self-pay

## 2021-02-22 ENCOUNTER — Other Ambulatory Visit: Payer: Self-pay

## 2021-02-22 ENCOUNTER — Ambulatory Visit: Payer: Managed Care, Other (non HMO) | Admitting: Oncology

## 2021-02-22 ENCOUNTER — Inpatient Hospital Stay: Payer: 59 | Attending: Oncology | Admitting: Oncology

## 2021-02-22 ENCOUNTER — Inpatient Hospital Stay (HOSPITAL_BASED_OUTPATIENT_CLINIC_OR_DEPARTMENT_OTHER): Payer: 59 | Admitting: Oncology

## 2021-02-22 VITALS — BP 107/78 | HR 67 | Temp 97.4°F | Resp 18 | Wt 155.6 lb

## 2021-02-22 DIAGNOSIS — Z853 Personal history of malignant neoplasm of breast: Secondary | ICD-10-CM | POA: Diagnosis not present

## 2021-02-22 DIAGNOSIS — Z9221 Personal history of antineoplastic chemotherapy: Secondary | ICD-10-CM | POA: Insufficient documentation

## 2021-02-22 DIAGNOSIS — Z170 Estrogen receptor positive status [ER+]: Secondary | ICD-10-CM | POA: Insufficient documentation

## 2021-02-22 NOTE — Progress Notes (Deleted)
  [place] OFFICE PROGRESS NOTE   Diagnosis:   INTERVAL HISTORY:   ***  Objective:  Vital signs in last 24 hours:  There were no vitals taken for this visit.    HEENT: *** Lymphatics: *** Resp: *** Cardio: *** GI: *** Vascular: *** Neuro:***  Skin:***   Portacath/PICC-without erythema  Lab Results:  Lab Results  Component Value Date   WBC 6.3 08/21/2020   HGB 13.0 08/21/2020   HCT 39.3 08/21/2020   MCV 83.1 08/21/2020   PLT 208.0 08/21/2020   NEUTROABS 3.0 08/21/2020    CMP  Lab Results  Component Value Date   NA 138 08/21/2020   K 3.9 08/21/2020   CL 102 08/21/2020   CO2 30 08/21/2020   GLUCOSE 98 08/21/2020   BUN 16 08/21/2020   CREATININE 0.85 08/21/2020   CALCIUM 10.1 08/21/2020   PROT 7.3 08/21/2020   ALBUMIN 4.8 08/21/2020   AST 17 08/21/2020   ALT 14 08/21/2020   ALKPHOS 64 08/21/2020   BILITOT 0.3 08/21/2020   GFRNONAA >60 03/30/2011   GFRAA >60 03/30/2011    No results found for: CEA1, CEA, CAN199, CA125  No results found for: INR, LABPROT  Imaging:  No results found.  Medications: I have reviewed the patient's current medications.   Assessment/Plan: [name] was diagnosed with right-sided breast cancer in November 1998. She developed a right breast recurrence in December 2003 and underwent a right mastectomy/TRAM reconstruction. She completed adjuvant AC and Taxol chemotherapy. She took tamoxifen August 2004 through July 2009. She began Femara 04/12/2008, discontinued at the end of 2014.   Disposition: ***  [name], MD  02/22/2021  3:50 PM

## 2021-02-22 NOTE — Progress Notes (Signed)
  [place] OFFICE PROGRESS NOTE   Diagnosis: Breast cancer  INTERVAL HISTORY:   [name] returns for a scheduled visit.  She feels well.  No new complaint.  A left mammogram on 09/07/2020 was negative.  She is followed by dermatology for evaluation of moles.  Objective:  Vital signs in last 24 hours:  Blood pressure 107/78, pulse 67, temperature (!) 97.4 F (36.3 C), temperature source Temporal, resp. rate 18, weight 155 lb 9.6 oz (70.6 kg), SpO2 98 %.    HEENT: Neck without mass Lymphatics: No cervical, supraclavicular, or right axillary nodes.  Pea-sized mobile left axillary node? Resp: Lungs clear bilaterally Cardio: Regular rate and rhythm GI: No hepatomegaly Vascular: No leg edema Breast: No mass in the left breast.  The oval cutaneous lesion appreciated last year is not apparent today.  Status post right mastectomy with a TRAM reconstruction.  No evidence for chest wall tumor recurrence.  Both axillae appear benign. Skin: Multiple hyperpigmented moles over the trunk including a slightly raised less than dark brown/black mole at the lower back   Lab Results:  Lab Results  Component Value Date   WBC 6.3 08/21/2020   HGB 13.0 08/21/2020   HCT 39.3 08/21/2020   MCV 83.1 08/21/2020   PLT 208.0 08/21/2020   NEUTROABS 3.0 08/21/2020    CMP  Lab Results  Component Value Date   NA 138 08/21/2020   K 3.9 08/21/2020   CL 102 08/21/2020   CO2 30 08/21/2020   GLUCOSE 98 08/21/2020   BUN 16 08/21/2020   CREATININE 0.85 08/21/2020   CALCIUM 10.1 08/21/2020   PROT 7.3 08/21/2020   ALBUMIN 4.8 08/21/2020   AST 17 08/21/2020   ALT 14 08/21/2020   ALKPHOS 64 08/21/2020   BILITOT 0.3 08/21/2020   GFRNONAA >60 03/30/2011   GFRAA >60 03/30/2011     Medications: I have reviewed the patient's current medications.   Assessment/Plan:  [name] was diagnosed with right-sided breast cancer in November 1998. She developed a right breast recurrence in  December 2003 and underwent a right mastectomy/TRAM reconstruction. She completed adjuvant AC and Taxol chemotherapy. She took tamoxifen August 2004 through July 2009. She began Femara 04/12/2008, discontinued at the end of 2014.     Disposition: [name] remains in remission from breast cancer.  She will be scheduled for a left mammogram in January 2023.  She would like to schedule next follow-up visit here in January 2024 to coincide with the next mammogram.  I encouraged her to follow-up with dermatology to evaluate moles including the hyperpigmented mole at the lower back.  [name], MD  02/22/2021  4:19 PM

## 2021-02-23 ENCOUNTER — Telehealth: Payer: Self-pay | Admitting: Oncology

## 2021-02-23 NOTE — Telephone Encounter (Signed)
Scheduled appt per 7/14 los - left message for patient with appt date and time

## 2021-03-05 ENCOUNTER — Encounter: Payer: 59 | Admitting: Internal Medicine

## 2021-03-08 ENCOUNTER — Encounter: Payer: 59 | Admitting: Internal Medicine

## 2021-03-19 DIAGNOSIS — E673 Hypervitaminosis D: Secondary | ICD-10-CM | POA: Insufficient documentation

## 2021-03-19 NOTE — Progress Notes (Addendum)
Subjective:    Patient ID: [name], female    DOB: August 08, 1966, 55 y.o.   MRN: [id number]   This visit occurred during the SARS-CoV-2 public health emergency.  Safety protocols were in place, including screening questions prior to the visit, additional usage of staff PPE, and extensive cleaning of exam room while observing appropriate contact time as indicated for disinfecting solutions.    HPI She is here for a physical exam.   Still having sleep issues - falling asleep and staying asleep are both an issue.  Melatonin has not helped.  Otc sleeping pills have not worked.  There have been some nights that she has not slept until 6:00 in the morning.  Her depression and anxiety are well controlled.  She has tried to decrease Lexapro to 5 mg daily and is doing okay with that.  Medications and allergies reviewed with patient and updated if appropriate.  Patient Active Problem List   Diagnosis Date Noted   Sleep difficulties 03/20/2021   High vitamin D level 03/19/2021   Rib pain on left side 06/01/2019   Prediabetes 03/14/2018   Cervicogenic headache 03/11/2018   History of breast cancer 01/24/2017   Overactive bladder 01/24/2017   PVC (premature ventricular contraction) 07/15/2016   Anxiety associated with depression 11/09/2013   Crohn's colitis ([place]) 11/09/2012   Osteopenia 10/29/2012   Hypothyroidism 04/22/2012   GOITER, MULTINODULAR 07/14/2007   INCONTINENCE, URGE 10/09/2006    Current Outpatient Medications on File Prior to Visit  Medication Sig Dispense Refill   Adalimumab (HUMIRA PEN) 40 MG/0.4ML PNKT INJECT 40MG SUBCUTANEOUSLY  EVERY 2 WEEKS 2 each 3   b complex vitamins capsule Take 1 capsule by mouth at bedtime.      Biotin 5000 MCG CAPS Take 5,000 mcg by mouth 2 (two) times daily.      Calcium Carb-Cholecalciferol (CALCIUM + D3 PO) Take 1 tablet by mouth at bedtime.      cetirizine (ZYRTEC) 10 MG tablet Take 10 mg by mouth daily.     colestipol (COLESTID) 1  g tablet Take 1 tablet (1 g total) by mouth 2 (two) times daily. 60 tablet 11   dicyclomine (BENTYL) 10 MG capsule Take 1 capsule (10 mg total) by mouth 3 (three) times daily before meals. As needed 90 capsule 3   escitalopram (LEXAPRO) 10 MG tablet TAKE 1 TABLET BY MOUTH  DAILY 90 tablet 3   fluticasone (FLONASE) 50 MCG/ACT nasal spray Place 2 sprays into both nostrils daily. In each nostril as needed (Patient taking differently: Place 2 sprays into both nostrils daily as needed for allergies.) 16 g 2   hyoscyamine (LEVSIN SL) 0.125 MG SL tablet Use 1 tablet daily as needed for rectal spasms 30 tablet 0   ibuprofen (ADVIL,MOTRIN) 200 MG tablet Take 600 mg by mouth every 6 (six) hours as needed for moderate pain.      levothyroxine (SYNTHROID) 88 MCG tablet TAKE ONE TABLET BY MOUTH DAILY BEFORE BREAKFAST 90 tablet 0   MELATONIN PO Take 12 mg by mouth at bedtime.     Multiple Vitamin (MULTIVITAMIN) tablet Take 1 tablet by mouth daily.     VITAMIN E PO Take 1 capsule by mouth daily.      No current facility-administered medications on file prior to visit.    Past Medical History:  Diagnosis Date   Anxiety    Asthma    Chronic headaches    Granulomatous colitis ( [place])    History of bone density  study    no osteopenia   History of MRI of cervical spine 4/03   mild DJD   HX: breast cancer 11/98 and 12/03   recurrance   Hypothyroidism    IBS (irritable bowel syndrome)    Inguinal hernia    Personal history of chemotherapy    Personal history of radiation therapy     Past Surgical History:  Procedure Laterality Date   INGUINAL HERNIA REPAIR Right    MASTECTOMY Right 2010   TOTAL ABDOMINAL HYSTERECTOMY W/ BILATERAL SALPINGOOPHORECTOMY  2007   left cervix in place    Social History   Socioeconomic History   Marital status: Single    Spouse name: Not on file   Number of children: 0   Years of education: Not on file   Highest education level: Not on file  Occupational History    Occupation: managed care    Employer: [employer]  Tobacco Use   Smoking status: Never   Smokeless tobacco: Never  Vaping Use   Vaping Use: Never used  Substance and Sexual Activity   Alcohol use: Yes    Comment: occas   Drug use: No   Sexual activity: Not on file  Other Topics Concern   Not on file  Social History Narrative   Exercise; runs   Social Determinants of Health   Financial Resource Strain: Not on file  Food Insecurity: Not on file  Transportation Needs: Not on file  Physical Activity: Not on file  Stress: Not on file  Social Connections: Not on file    Family History  Problem Relation Age of Onset   Hypertension Mother    Colon polyps Mother    Irritable bowel syndrome Mother    Skin cancer Father    Melanoma Father    Breast cancer Paternal Aunt    Colon polyps Brother    Psoriasis Brother    Dermatomyositis Brother    Diabetes Neg Hx    Heart disease Neg Hx    Stroke Neg Hx    Stomach cancer Neg Hx    Colon cancer Neg Hx    Esophageal cancer Neg Hx    Pancreatic cancer Neg Hx     Review of Systems  Constitutional:  Negative for chills and fever.  Eyes:  Negative for visual disturbance.  Respiratory:  Positive for cough (occ). Negative for shortness of breath and wheezing.   Cardiovascular:  Negative for chest pain, palpitations and leg swelling.  Gastrointestinal:  Positive for abdominal pain (from crohn's) and nausea (from crohn's). Negative for blood in stool, constipation and diarrhea.  Genitourinary:  Negative for dysuria and hematuria.  Musculoskeletal:  Positive for back pain. Negative for arthralgias.  Skin:  Positive for rash (under breasts, ankles).  Neurological:  Positive for headaches (occ). Negative for dizziness and light-headedness.  Psychiatric/Behavioral:  Positive for dysphoric mood and sleep disturbance. The patient is nervous/anxious.       Objective:   Vitals:   03/20/21 1105  BP: 100/80  Pulse: 73  Temp: 98.6 F (37  C)  SpO2: 96%   Filed Weights   03/20/21 1105  Weight: 156 lb (70.8 kg)   Body mass index is 25.18 kg/m.  BP Readings from Last 3 Encounters:  03/20/21 100/80  02/22/21 107/78  08/21/20 96/60    Wt Readings from Last 3 Encounters:  03/20/21 156 lb (70.8 kg)  02/22/21 155 lb 9.6 oz (70.6 kg)  08/21/20 160 lb (72.6 kg)    Depression screen  [facility] 2/9 03/20/2021 06/08/2019 07/11/2016 07/04/2015 11/09/2013  Decreased Interest 0 0 0 0 1  Down, Depressed, Hopeless 0 0 0 0 1  PHQ - 2 Score 0 0 0 0 2  Altered sleeping 3 1 - - 1  Tired, decreased energy 3 1 - - 1  Change in appetite 2 3 - - 1  Feeling bad or failure about yourself  0 0 - - 1  Trouble concentrating 0 0 - - 1  Moving slowly or fidgety/restless 0 0 - - 0  Suicidal thoughts 0 0 - - 0  PHQ-9 Score 8 5 - - 7  Difficult doing work/chores Somewhat difficult - - - -  Some recent data might be hidden    GAD 7 : Generalized Anxiety Score 03/20/2021 06/08/2019  Nervous, Anxious, on Edge 0 1  Control/stop worrying 0 0  Worry too much - different things 0 0  Trouble relaxing 0 0  Restless 0 0  Easily annoyed or irritable 1 0  Afraid - awful might happen 1 0  Total GAD 7 Score 2 1  Anxiety Difficulty Somewhat difficult -       Physical Exam Constitutional: She appears well-developed and well-nourished. No distress.  HENT:  Head: Normocephalic and atraumatic.  Right Ear: External ear normal. Normal ear canal and TM Left Ear: External ear normal.  Normal ear canal and TM Mouth/Throat: Oropharynx is clear and moist.  Eyes: Conjunctivae and EOM are normal.  Neck: Neck supple. No tracheal deviation present. No thyromegaly present.  No carotid bruit  Cardiovascular: Normal rate, regular rhythm and normal heart sounds.   No murmur heard.  No edema. Pulmonary/Chest: Effort normal and breath sounds normal. No respiratory distress. She has no wheezes. She has no rales.  Breast: deferred   Abdominal: Soft. She exhibits no  distension. There is no tenderness.  Lymphadenopathy: She has no cervical adenopathy.  Skin: Skin is warm and dry. She is not diaphoretic.  Psychiatric: She has a normal mood and affect. Her behavior is normal.     Lab Results  Component Value Date   WBC 6.3 08/21/2020   HGB 13.0 08/21/2020   HCT 39.3 08/21/2020   PLT 208.0 08/21/2020   GLUCOSE 98 08/21/2020   CHOL 213 (H) 01/19/2020   TRIG 77.0 01/19/2020   HDL 64.60 01/19/2020   LDLCALC 133 (H) 01/19/2020   ALT 14 08/21/2020   AST 17 08/21/2020   NA 138 08/21/2020   K 3.9 08/21/2020   CL 102 08/21/2020   CREATININE 0.85 08/21/2020   BUN 16 08/21/2020   CO2 30 08/21/2020   TSH 0.67 01/19/2020   HGBA1C 6.1 06/01/2019         Assessment & Plan:   Physical exam: Screening blood work  ordered Exercise  walking  Weight  normal Substance abuse  none   Reviewed recommended immunizations.   Health Maintenance  Topic Date Due   Hepatitis C Screening  Never done   Zoster Vaccines- Shingrix (1 of 2) Never done   INFLUENZA VACCINE  03/12/2021   COVID-19 Vaccine (3 - Booster for Pfizer series) 05/16/2021   COLONOSCOPY (Pts 45-86yr Insurance coverage will need to be confirmed)  02/17/2022   DEXA SCAN  09/05/2022   MAMMOGRAM  09/07/2022   TETANUS/TDAP  01/18/2030   HIV Screening  Completed   Pneumococcal Vaccine [id number] old  Aged Out   HPV VACCINES  Aged Out          See Problem List  for Assessment and Plan of chronic medical problems.

## 2021-03-19 NOTE — Patient Instructions (Addendum)
Blood work was ordered.     Medications changes include :   start trazodone 50 mg at night.  Lotrisone cream  Your prescription(s) have been submitted to your pharmacy. Please take as directed and contact our office if you believe you are having problem(s) with the medication(s).    Please followup in 6 months   Health Maintenance, Female Adopting a healthy lifestyle and getting preventive care are important in promoting health and wellness. Ask your health care provider about: The right schedule for you to have regular tests and exams. Things you can do on your own to prevent diseases and keep yourself healthy. What should I know about diet, weight, and exercise? Eat a healthy diet  Eat a diet that includes plenty of vegetables, fruits, low-fat dairy products, and lean protein. Do not eat a lot of foods that are high in solid fats, added sugars, or sodium.  Maintain a healthy weight Body mass index (BMI) is used to identify weight problems. It estimates body fat based on height and weight. Your health care provider can help determineyour BMI and help you achieve or maintain a healthy weight. Get regular exercise Get regular exercise. This is one of the most important things you can do for your health. Most adults should: Exercise for at least 150 minutes each week. The exercise should increase your heart rate and make you sweat (moderate-intensity exercise). Do strengthening exercises at least twice a week. This is in addition to the moderate-intensity exercise. Spend less time sitting. Even light physical activity can be beneficial. Watch cholesterol and blood lipids Have your blood tested for lipids and cholesterol at 56 years of age, then havethis test every 5 years. Have your cholesterol levels checked more often if: Your lipid or cholesterol levels are high. You are older than 56 years of age. You are at high risk for heart disease. What should I know about cancer  screening? Depending on your health history and family history, you may need to have cancer screening at various ages. This may include screening for: Breast cancer. Cervical cancer. Colorectal cancer. Skin cancer. Lung cancer. What should I know about heart disease, diabetes, and high blood pressure? Blood pressure and heart disease High blood pressure causes heart disease and increases the risk of stroke. This is more likely to develop in people who have high blood pressure readings, are of African descent, or are overweight. Have your blood pressure checked: Every 3-5 years if you are 67-87 years of age. Every year if you are 50 years old or older. Diabetes Have regular diabetes screenings. This checks your fasting blood sugar level. Have the screening done: Once every three years after age 61 if you are at a normal weight and have a low risk for diabetes. More often and at a younger age if you are overweight or have a high risk for diabetes. What should I know about preventing infection? Hepatitis B If you have a higher risk for hepatitis B, you should be screened for this virus. Talk with your health care provider to find out if you are at risk forhepatitis B infection. Hepatitis C Testing is recommended for: Everyone born from [birth year] through 1966. Anyone with known risk factors for hepatitis C. Sexually transmitted infections (STIs) Get screened for STIs, including gonorrhea and chlamydia, if: You are sexually active and are younger than 56 years of age. You are older than 56 years of age and your health care provider tells you that you are at risk  for this type of infection. Your sexual activity has changed since you were last screened, and you are at increased risk for chlamydia or gonorrhea. Ask your health care provider if you are at risk. Ask your health care provider about whether you are at high risk for HIV. Your health care provider may recommend a prescription medicine to  help prevent HIV infection. If you choose to take medicine to prevent HIV, you should first get tested for HIV. You should then be tested every 3 months for as long as you are taking the medicine. Pregnancy If you are about to stop having your period (premenopausal) and you may become pregnant, seek counseling before you get pregnant. Take 400 to 800 micrograms (mcg) of folic acid every day if you become pregnant. Ask for birth control (contraception) if you want to prevent pregnancy. Osteoporosis and menopause Osteoporosis is a disease in which the bones lose minerals and strength with aging. This can result in bone fractures. If you are 58 years old or older, or if you are at risk for osteoporosis and fractures, ask your health care provider if you should: Be screened for bone loss. Take a calcium or vitamin D supplement to lower your risk of fractures. Be given hormone replacement therapy (HRT) to treat symptoms of menopause. Follow these instructions at home: Lifestyle Do not use any products that contain nicotine or tobacco, such as cigarettes, e-cigarettes, and chewing tobacco. If you need help quitting, ask your health care provider. Do not use street drugs. Do not share needles. Ask your health care provider for help if you need support or information about quitting drugs. Alcohol use Do not drink alcohol if: Your health care provider tells you not to drink. You are pregnant, may be pregnant, or are planning to become pregnant. If you drink alcohol: Limit how much you use to 0-1 drink a day. Limit intake if you are breastfeeding. Be aware of how much alcohol is in your drink. In the [place] one drink equals one 12 oz bottle of beer (355 mL), one 5 oz glass of wine (148 mL), or one 1 oz glass of hard liquor (44 mL). General instructions Schedule regular health, dental, and eye exams. Stay current with your vaccines. Tell your health care provider if: You often feel depressed. You  have ever been abused or do not feel safe at home. Summary Adopting a healthy lifestyle and getting preventive care are important in promoting health and wellness. Follow your health care provider's instructions about healthy diet, exercising, and getting tested or screened for diseases. Follow your health care provider's instructions on monitoring your cholesterol and blood pressure. This information is not intended to replace advice given to you by your health care provider. Make sure you discuss any questions you have with your healthcare provider. Document Revised: 07/22/2018 Document Reviewed: 07/22/2018 Elsevier Patient Education  2022 [employer].

## 2021-03-20 ENCOUNTER — Other Ambulatory Visit: Payer: Self-pay

## 2021-03-20 ENCOUNTER — Ambulatory Visit (INDEPENDENT_AMBULATORY_CARE_PROVIDER_SITE_OTHER): Payer: 59 | Admitting: Internal Medicine

## 2021-03-20 ENCOUNTER — Encounter: Payer: Self-pay | Admitting: Internal Medicine

## 2021-03-20 VITALS — BP 100/80 | HR 73 | Temp 98.6°F | Ht 66.0 in | Wt 156.0 lb

## 2021-03-20 DIAGNOSIS — E673 Hypervitaminosis D: Secondary | ICD-10-CM

## 2021-03-20 DIAGNOSIS — G479 Sleep disorder, unspecified: Secondary | ICD-10-CM

## 2021-03-20 DIAGNOSIS — E039 Hypothyroidism, unspecified: Secondary | ICD-10-CM

## 2021-03-20 DIAGNOSIS — M85852 Other specified disorders of bone density and structure, left thigh: Secondary | ICD-10-CM | POA: Diagnosis not present

## 2021-03-20 DIAGNOSIS — Z0000 Encounter for general adult medical examination without abnormal findings: Secondary | ICD-10-CM

## 2021-03-20 DIAGNOSIS — F418 Other specified anxiety disorders: Secondary | ICD-10-CM

## 2021-03-20 DIAGNOSIS — K5010 Crohn's disease of large intestine without complications: Secondary | ICD-10-CM

## 2021-03-20 DIAGNOSIS — R7303 Prediabetes: Secondary | ICD-10-CM | POA: Diagnosis not present

## 2021-03-20 DIAGNOSIS — Z1159 Encounter for screening for other viral diseases: Secondary | ICD-10-CM

## 2021-03-20 LAB — CBC WITH DIFFERENTIAL/PLATELET
Basophils Absolute: 0 10*3/uL (ref 0.0–0.1)
Basophils Relative: 0.8 % (ref 0.0–3.0)
Eosinophils Absolute: 0.2 10*3/uL (ref 0.0–0.7)
Eosinophils Relative: 3.3 % (ref 0.0–5.0)
HCT: 40.1 % (ref 36.0–46.0)
Hemoglobin: 13.1 g/dL (ref 12.0–15.0)
Lymphocytes Relative: 35.8 % (ref 12.0–46.0)
Lymphs Abs: 1.8 10*3/uL (ref 0.7–4.0)
MCHC: 32.8 g/dL (ref 30.0–36.0)
MCV: 84 fl (ref 78.0–100.0)
Monocytes Absolute: 0.4 10*3/uL (ref 0.1–1.0)
Monocytes Relative: 9 % (ref 3.0–12.0)
Neutro Abs: 2.5 10*3/uL (ref 1.4–7.7)
Neutrophils Relative %: 51.1 % (ref 43.0–77.0)
Platelets: 218 10*3/uL (ref 150.0–400.0)
RBC: 4.77 Mil/uL (ref 3.87–5.11)
RDW: 13.2 % (ref 11.5–15.5)
WBC: 4.9 10*3/uL (ref 4.0–10.5)

## 2021-03-20 LAB — COMPREHENSIVE METABOLIC PANEL
ALT: 13 U/L (ref 0–35)
AST: 18 U/L (ref 0–37)
Albumin: 4.6 g/dL (ref 3.5–5.2)
Alkaline Phosphatase: 79 U/L (ref 39–117)
BUN: 10 mg/dL (ref 6–23)
CO2: 31 mEq/L (ref 19–32)
Calcium: 9.6 mg/dL (ref 8.4–10.5)
Chloride: 102 mEq/L (ref 96–112)
Creatinine, Ser: 0.67 mg/dL (ref 0.40–1.20)
GFR: 98.16 mL/min (ref >60.00)
Glucose, Bld: 100 mg/dL — ABNORMAL HIGH (ref 70–99)
Potassium: 4.1 mEq/L (ref 3.5–5.1)
Sodium: 139 mEq/L (ref 135–145)
Total Bilirubin: 0.5 mg/dL (ref 0.2–1.2)
Total Protein: 7.4 g/dL (ref 6.0–8.3)

## 2021-03-20 LAB — LIPID PANEL
Cholesterol: 237 mg/dL — ABNORMAL HIGH (ref 0–200)
HDL: 66 mg/dL (ref >39.00)
LDL Cholesterol: 148 mg/dL — ABNORMAL HIGH (ref 0–99)
NonHDL: 171.47
Total CHOL/HDL Ratio: 4
Triglycerides: 117 mg/dL (ref 0.0–149.0)
VLDL: 23.4 mg/dL (ref 0.0–40.0)

## 2021-03-20 LAB — HEMOGLOBIN A1C: Hgb A1c MFr Bld: 6 % (ref 4.6–6.5)

## 2021-03-20 LAB — VITAMIN D 25 HYDROXY (VIT D DEFICIENCY, FRACTURES): VITD: 63.85 ng/mL (ref 30.00–100.00)

## 2021-03-20 LAB — TSH: TSH: 0.7 u[IU]/mL (ref 0.35–5.50)

## 2021-03-20 MED ORDER — TRAZODONE HCL 50 MG PO TABS: 25.0000 mg | ORAL_TABLET | Freq: Every evening | ORAL | 1 refills | Status: DC | PRN

## 2021-03-20 MED ORDER — CLOTRIMAZOLE-BETAMETHASONE 1-0.05 % EX CREA: 1.0000 "application " | TOPICAL_CREAM | Freq: Every day | CUTANEOUS | 2 refills | Status: AC

## 2021-03-20 NOTE — Assessment & Plan Note (Signed)
Chronic Check a1c Low sugar / carb diet Stressed regular exercise  

## 2021-03-20 NOTE — Assessment & Plan Note (Signed)
Chronic DEXA up-to-date Continue regular exercise Continue calcium Vitamin D level was high-recheck vitamin D level

## 2021-03-20 NOTE — Assessment & Plan Note (Signed)
New problem She is having difficulty sleeping-both with falling asleep and staying asleep Over-the-counter melatonin and sleep medications not effective Not related to anxiety or depression She is exercising regularly and will continue Discussed options-we will try trazodone 25 mg - 50 mg at night.  If those doses are not effective advised that she can try 100 mg at night and then update me on how successful doses Discussed the importance of good sleep hygiene

## 2021-03-20 NOTE — Assessment & Plan Note (Addendum)
Chronic Anxiety and depression assessed by PHQ-9 score and GAD-7 score GAD-7 score-2, indicating mild depression.  Overall she feels this is controlled and stable PHQ-9 score 8, which indicates some mild depression.  Most of her positive scores were related to her poor sleep and fatigue because of that.  She feels her depression is controlled and feels once her sleep improves those things will also improve Anxiety and depression controlled, stable Continue Lexapro 5-10 mg daily she will see how much she needs and to continue that dose

## 2021-03-20 NOTE — Assessment & Plan Note (Signed)
Chronic  Clinically euthyroid Currently taking levothyroxine 88 mcg daily Check tsh  Titrate med dose if needed

## 2021-03-20 NOTE — Assessment & Plan Note (Signed)
Chronic Overall controlled Managed by Dr. Silverio Decamp

## 2021-03-20 NOTE — Assessment & Plan Note (Signed)
And a high vitamin D level recently Currently not taking vitamin D Recheck vitamin D level

## 2021-03-21 LAB — HEPATITIS C ANTIBODY
Hepatitis C Ab: NONREACTIVE
SIGNAL TO CUT-OFF: 0.04 (ref <1.00)

## 2021-03-25 ENCOUNTER — Other Ambulatory Visit: Payer: Self-pay | Admitting: Internal Medicine

## 2021-04-02 ENCOUNTER — Other Ambulatory Visit: Payer: Self-pay | Admitting: Gastroenterology

## 2021-06-04 ENCOUNTER — Encounter: Payer: Self-pay | Admitting: Internal Medicine

## 2021-06-05 ENCOUNTER — Other Ambulatory Visit: Payer: Self-pay

## 2021-06-05 MED ORDER — LEVOTHYROXINE SODIUM 88 MCG PO TABS: ORAL_TABLET | ORAL | 2 refills | Status: DC

## 2021-06-06 ENCOUNTER — Ambulatory Visit: Payer: 59 | Admitting: Gastroenterology

## 2021-06-06 ENCOUNTER — Encounter: Payer: Self-pay | Admitting: Gastroenterology

## 2021-06-06 VITALS — BP 90/60 | HR 67 | Ht 66.0 in | Wt 160.0 lb

## 2021-06-06 DIAGNOSIS — K580 Irritable bowel syndrome with diarrhea: Secondary | ICD-10-CM | POA: Diagnosis not present

## 2021-06-06 DIAGNOSIS — K9089 Other intestinal malabsorption: Secondary | ICD-10-CM

## 2021-06-06 DIAGNOSIS — K5080 Crohn's disease of both small and large intestine without complications: Secondary | ICD-10-CM

## 2021-06-06 NOTE — Patient Instructions (Signed)
Try a Lactose free diet for 2 weeks  Continue Colestid  Follow up with Endocrinology  We will give you the food symptoms dairy to fill out  Call In 2 weeks if not better we will plan to send rx of xifaxian   Lactose-Free Diet, Adult If you have lactose intolerance, you are not able to digest lactose. Lactose is a natural sugar found mainly in dairy milk and dairy products. A lactose-free diet can help you avoid foods and beverages that contain lactose. What are tips for following this plan? Reading food labels Do not consume foods, beverages, vitamins, minerals, or medicines containing lactose. Read ingredient lists carefully. Look for the words "lactose-free" on labels. Meal planning Use alternatives to dairy milk and foods made with milk products. These include the following: Lactose-free milk. Soy milk with added calcium and vitamin D. Almond milk, coconut milk, rice milk, or other nondairy milk alternatives with added calcium and vitamin D. Note that a lot of these are low in protein. Soy products, such as soy yogurt, soy cheese, soy ice cream, and soy-based sour cream. Other nut milk products, such as almond yogurt, almond cheese, cashew yogurt, cashew cheese, cashew ice cream, coconut yogurt, and coconut ice cream. Medicines, vitamins, and supplements Use lactase enzyme drops or tablets as directed by your health care provider. Make sure you get enough calcium and vitamin D in your diet. A lactose-free eating plan can be lacking in these important nutrients. Take calcium and vitamin D supplements as directed by your health care provider. Talk with your health care provider about supplements if you are not able to get enough calcium and vitamin D from food. What foods should I eat? Fruits All fresh, canned, frozen, or dried fruits and fruit juices that are not processed with lactose. Vegetables All fresh, frozen, and canned vegetables without cheese, cream, or butter  sauces. Grains Any that are not made with dairy milk or dairy products. Meats and other proteins Any meat, fish, poultry, and other protein sources that are not made with dairy milk or dairy products. Fats and oils Any that are not made with dairy milk or dairy products. Sweets and desserts Any that are not made with dairy milk or dairy products. Seasonings and condiments Any that are not made with dairy milk or dairy products. Calcium Calcium is found in many foods that contain lactose and is important for bone health. The amount of calcium you need depends on your age: Adults younger than 50 years: 1,000 mg of calcium a day. Adults older than 50 years: 1,200 mg of calcium a day. If you are not getting enough calcium, you may get it from other sources, including: Orange juice that has been fortified with calcium. This means that calcium has been added to the product. There are 300-350 mg of calcium in 1 cup (237 mL) of calcium-fortified orange juice. Soy milk fortified with calcium. There are 300-400 mg of calcium in 1 cup (237 mL) of calcium-fortified soy milk. Rice or almond milk fortified with calcium. There are 300 mg of calcium in 1 cup (237 mL) of calcium-fortified rice or almond milk. Breakfast cereals fortified with calcium. There are 100-1,000 mg of calcium in calcium-fortified breakfast cereals. Spinach, cooked. There are 145 mg of calcium in  cup (90 g) of cooked spinach. Edamame, cooked. There are 130 mg of calcium in  cup (47 g) of cooked edamame. Collard greens, cooked. There are 125 mg of calcium in  cup (85 g) of  cooked collard greens. Kale, frozen or cooked. There are 90 mg of calcium in  cup (59 g) of cooked or frozen kale. Almonds. There are 95 mg of calcium in  cup (35 g) of almonds. Broccoli, cooked. There are 60 mg of calcium in 1 cup (156 g) of cooked broccoli. The items listed above may not be a complete list of foods and beverages you can eat. Contact a  dietitian for more options. What foods should I avoid? Lactose is found in dairy milk and dairy products, such as: Yogurt. Cheese. Butter. Margarine. Sour cream. Cream. Whipped toppings and creamers. Ice cream and other dairy-based desserts. Lactose is also found in foods or products made with dairy milk or milk ingredients. To find out whether a food contains dairy milk or a milk ingredient, look at the ingredients list. Avoid foods with the statement "May contain milk" and foods that contain: Milk powder. Whey. Curd. Lactose. Lactoglobulin. The items listed above may not be a complete list of foods and beverages to avoid. Contact a dietitian for more information. Where to find more information [employer] of Diabetes and Digestive and Kidney Diseases: [email or web address] Summary If you are lactose intolerant, it means that you are not able to digest lactose, a natural sugar found in milk and milk products. Following a lactose-free diet can help you manage this condition. Calcium is important for bone health and is found in many foods that contain lactose. Talk with your health care provider about other sources of calcium. This information is not intended to replace advice given to you by your health care provider. Make sure you discuss any questions you have with your health care provider. Document Revised: 07/04/2020 Document Reviewed: 07/04/2020 Elsevier Patient Education  2022 [employer].   If you are age 32 or older, your body mass index should be between 23-30. Your Body mass index is 25.82 kg/m. If this is out of the aforementioned range listed, please consider follow up with your Primary Care Provider.  If you are age 45 or younger, your body mass index should be between 19-25. Your Body mass index is 25.82 kg/m. If this is out of the aformentioned range listed, please consider follow up with your Primary Care Provider.    ________________________________________________________  The [facility] providers would like to encourage you to use [facility] to communicate with providers for non-urgent requests or questions.  Due to long hold times on the telephone, sending your provider a message by [facility] may be a faster and more efficient way to get a response.  Please allow 48 business hours for a response.  Please remember that this is for non-urgent requests.  _______________________________________________________   I appreciate the  opportunity to care for you  Thank You   Harl Bowie , MD

## 2021-06-06 NOTE — Progress Notes (Signed)
[name]    [id number]    01-15-1966  Primary Care Physician:Burns, Claudina Lick, MD  Referring Physician: Binnie Rail, MD [place],  [place] [id number]   Chief complaint: Crohn's disease  HPI:  55 year old very pleasant female with h/o crohn's colitis initially diagnosed in 2013 , is here for follow up visit for Crohn's disease She is in clinical remission on Humira 58m every 2 weeks but she does feel mild symptoms for few days before she is due for her Humira injection   Bowel frequency and diarrhea improved with Colestid, she feels her bowel habits were almost normal for initial 3 months after she started it but she is currently having intermittent episodes of diarrhea, feels its related to her diet.     She is up-to-date with vaccinations   Colonoscopy 02/17/2017: Random biopsies from ileum and colon showed no active inflammation or dysplasia.    Bone density scan September 06, 2019: Showed osteopenia T score -1.9   Adalimumab drug trough and antibody level October 12, 2019: 8.3 [at therapeutic target] and undetectable respectively   Outpatient Encounter Medications as of 06/06/2021  Medication Sig   b complex vitamins capsule Take 1 capsule by mouth at bedtime.    Biotin 5000 MCG CAPS Take 5,000 mcg by mouth 2 (two) times daily.    Calcium Carb-Cholecalciferol (CALCIUM + D3 PO) Take 1 tablet by mouth at bedtime.    cetirizine (ZYRTEC) 10 MG tablet Take 10 mg by mouth daily.   clotrimazole-betamethasone (LOTRISONE) cream Apply 1 application topically daily.   colestipol (COLESTID) 1 g tablet Take 1 tablet (1 g total) by mouth 2 (two) times daily.   dicyclomine (BENTYL) 10 MG capsule Take 1 capsule (10 mg total) by mouth 3 (three) times daily before meals. As needed   escitalopram (LEXAPRO) 10 MG tablet TAKE 1 TABLET BY MOUTH  DAILY   fluticasone (FLONASE) 50 MCG/ACT nasal spray Place 2 sprays into both nostrils daily. In each nostril as needed  (Patient taking differently: Place 2 sprays into both nostrils daily as needed for allergies.)   HUMIRA PEN 40 MG/0.4ML PNKT INJECT 40MG SUBCUTANEOUSLY  EVERY 2 WEEKS   hyoscyamine (LEVSIN SL) 0.125 MG SL tablet Use 1 tablet daily as needed for rectal spasms   ibuprofen (ADVIL,MOTRIN) 200 MG tablet Take 600 mg by mouth every 6 (six) hours as needed for moderate pain.    levothyroxine (SYNTHROID) 88 MCG tablet TAKE ONE TABLET BY MOUTH DAILY BEFORE BREAKFAST   MELATONIN PO Take 12 mg by mouth at bedtime.   Multiple Vitamin (MULTIVITAMIN) tablet Take 1 tablet by mouth daily.   traZODone (DESYREL) 50 MG tablet Take 0.5-1 tablets (25-50 mg total) by mouth at bedtime as needed for sleep.   VITAMIN E PO Take 1 capsule by mouth daily.    No facility-administered encounter medications on file as of 06/06/2021.    Allergies as of 06/06/2021 - Review Complete 06/06/2021  Allergen Reaction Noted   Sulfa antibiotics Rash 09/16/2011    Past Medical History:  Diagnosis Date   Anxiety    Asthma    Chronic headaches    [place]    History of bone density study    no osteopenia   History of MRI of cervical spine 4/03   mild DJD   HX: breast cancer 11/98 and 12/03   recurrance   Hypothyroidism    IBS (irritable bowel syndrome)  Inguinal hernia    Personal history of chemotherapy    Personal history of radiation therapy     Past Surgical History:  Procedure Laterality Date   INGUINAL HERNIA REPAIR Right    MASTECTOMY Right 2010   TOTAL ABDOMINAL HYSTERECTOMY W/ BILATERAL SALPINGOOPHORECTOMY  2007   left cervix in place    Family History  Problem Relation Age of Onset   Hypertension Mother    Colon polyps Mother    Irritable bowel syndrome Mother    Skin cancer Father    Melanoma Father    Breast cancer Paternal Aunt    Colon polyps Brother    Psoriasis Brother    Dermatomyositis Brother    Diabetes Neg Hx    Heart disease Neg Hx    Stroke Neg Hx    Stomach  cancer Neg Hx    Colon cancer Neg Hx    Esophageal cancer Neg Hx    Pancreatic cancer Neg Hx     Social History   Socioeconomic History   Marital status: Single    Spouse name: Not on file   Number of children: 0   Years of education: Not on file   Highest education level: Not on file  Occupational History   Occupation: managed care    Employer: [employer]  Tobacco Use   Smoking status: Never   Smokeless tobacco: Never  Vaping Use   Vaping Use: Never used  Substance and Sexual Activity   Alcohol use: Yes    Comment: occas   Drug use: No   Sexual activity: Not on file  Other Topics Concern   Not on file  Social History Narrative   Exercise; runs   Social Determinants of Health   Financial Resource Strain: Not on file  Food Insecurity: Not on file  Transportation Needs: Not on file  Physical Activity: Not on file  Stress: Not on file  Social Connections: Not on file  Intimate Partner Violence: Not on file      Review of systems: All other review of systems negative except as mentioned in the HPI.   Physical Exam: Vitals:   06/06/21 0822  BP: 90/60  Pulse: 67   Body mass index is 25.82 kg/m. Gen:      No acute distress HEENT:  sclera anicteric Abd:      soft, non-tender; no palpable masses, no distension Ext:    No edema Neuro: alert and oriented x 3 Psych: normal mood and affect  Data Reviewed:  Reviewed labs, radiology imaging, old records and pertinent past GI work up   Assessment and Plan/Recommendations:  55 year old very pleasant female female with history of Crohn's colitis initial diagnosis in 2013, in clinical remission on Humira 40 mg every 2 weeks   Colonoscopy July 2018 with no active inflammation   Intermittent diarrhea and fecal incontinence with fecal urgency: Likely secondary to IBS diarrhea and food intolerance Lactose-free diet Advised patient to maintain food symptom diary to identify potential food intolerance  Continues  to have persistent intermittent diarrhea, will consider course of Xifaxan 550 mg 3 times daily for 14 days for IBS diarrhea   Continue Colestid 1 g twice daily for bile salt induced diarrhea, advised patient to titrate the dose based on response to have 1-2 soft bowel movement daily   IBS with abdominal bloating: Use dicyclomine 10 mg up to 3 times daily as needed   IBD health maintenance: Bone density scan showed osteopenia, vitamin D level improved to normal  range.  She is not using high-dose supplements anymore  up-to-date with preventative screening  up-to-date with immunizations   Due for surveillance colonoscopy July 2023.    Complaints of increased fatigue, has history of thyroid nodules.  Advised patient to follow-up with endocrinology   The patient was provided an opportunity to ask questions and all were answered. The patient agreed with the plan and demonstrated an understanding of the instructions.  Damaris Hippo , MD    CC: Binnie Rail, MD

## 2021-06-10 ENCOUNTER — Other Ambulatory Visit: Payer: Self-pay | Admitting: Gastroenterology

## 2021-06-12 ENCOUNTER — Encounter: Payer: Self-pay | Admitting: Gastroenterology

## 2021-06-23 ENCOUNTER — Other Ambulatory Visit: Payer: Self-pay | Admitting: Internal Medicine

## 2021-07-27 ENCOUNTER — Ambulatory Visit: Payer: 59 | Admitting: Gastroenterology

## 2021-07-28 ENCOUNTER — Emergency Department (HOSPITAL_COMMUNITY)
Admission: EM | Admit: 2021-07-28 | Discharge: 2021-07-28 | Disposition: A | Discharge status: Home / Self Care | Payer: 59 | Attending: Emergency Medicine | Admitting: Emergency Medicine

## 2021-07-28 ENCOUNTER — Other Ambulatory Visit: Payer: Self-pay

## 2021-07-28 DIAGNOSIS — X150XXA Contact with hot stove (kitchen), initial encounter: Secondary | ICD-10-CM | POA: Insufficient documentation

## 2021-07-28 DIAGNOSIS — T23202A Burn of second degree of left hand, unspecified site, initial encounter: Secondary | ICD-10-CM | POA: Diagnosis not present

## 2021-07-28 DIAGNOSIS — E039 Hypothyroidism, unspecified: Secondary | ICD-10-CM | POA: Insufficient documentation

## 2021-07-28 DIAGNOSIS — Z853 Personal history of malignant neoplasm of breast: Secondary | ICD-10-CM | POA: Insufficient documentation

## 2021-07-28 DIAGNOSIS — Z79899 Other long term (current) drug therapy: Secondary | ICD-10-CM | POA: Diagnosis not present

## 2021-07-28 DIAGNOSIS — J45909 Unspecified asthma, uncomplicated: Secondary | ICD-10-CM | POA: Diagnosis not present

## 2021-07-28 DIAGNOSIS — Z9011 Acquired absence of right breast and nipple: Secondary | ICD-10-CM | POA: Diagnosis not present

## 2021-07-28 DIAGNOSIS — T23209A Burn of second degree of unspecified hand, unspecified site, initial encounter: Secondary | ICD-10-CM

## 2021-07-28 DIAGNOSIS — T300 Burn of unspecified body region, unspecified degree: Secondary | ICD-10-CM

## 2021-07-28 DIAGNOSIS — T23201A Burn of second degree of right hand, unspecified site, initial encounter: Secondary | ICD-10-CM | POA: Diagnosis not present

## 2021-07-28 LAB — CBC WITH DIFFERENTIAL/PLATELET
Abs Immature Granulocytes: 0.01 10*3/uL (ref 0.00–0.07)
Basophils Absolute: 0.1 10*3/uL (ref 0.0–0.1)
Basophils Relative: 1 %
Eosinophils Absolute: 0.2 10*3/uL (ref 0.0–0.5)
Eosinophils Relative: 2 %
HCT: 39.1 % (ref 36.0–46.0)
Hemoglobin: 12.5 g/dL (ref 12.0–15.0)
Immature Granulocytes: 0 %
Lymphocytes Relative: 36 %
Lymphs Abs: 2.5 10*3/uL (ref 0.7–4.0)
MCH: 27.2 pg (ref 26.0–34.0)
MCHC: 32 g/dL (ref 30.0–36.0)
MCV: 85 fL (ref 80.0–100.0)
Monocytes Absolute: 0.6 10*3/uL (ref 0.1–1.0)
Monocytes Relative: 9 %
Neutro Abs: 3.5 10*3/uL (ref 1.7–7.7)
Neutrophils Relative %: 52 %
Platelets: 233 10*3/uL (ref 150–400)
RBC: 4.6 MIL/uL (ref 3.87–5.11)
RDW: 12.2 % (ref 11.5–15.5)
WBC: 6.9 10*3/uL (ref 4.0–10.5)
nRBC: 0 % (ref 0.0–0.2)

## 2021-07-28 LAB — BASIC METABOLIC PANEL
Anion gap: 10 (ref 5–15)
BUN: 22 mg/dL — ABNORMAL HIGH (ref 6–20)
CO2: 26 mmol/L (ref 22–32)
Calcium: 9.5 mg/dL (ref 8.9–10.3)
Chloride: 104 mmol/L (ref 98–111)
Creatinine, Ser: 1.1 mg/dL — ABNORMAL HIGH (ref 0.44–1.00)
GFR, Estimated: 59 mL/min — ABNORMAL LOW (ref >60)
Glucose, Bld: 111 mg/dL — ABNORMAL HIGH (ref 70–99)
Potassium: 3.9 mmol/L (ref 3.5–5.1)
Sodium: 140 mmol/L (ref 135–145)

## 2021-07-28 MED ORDER — BACITRACIN ZINC 500 UNIT/GM EX OINT: 1.0000 "application " | TOPICAL_OINTMENT | Freq: Two times a day (BID) | CUTANEOUS | 0 refills | Status: DC

## 2021-07-28 MED ORDER — ONDANSETRON HCL 4 MG PO TABS: 4.0000 mg | ORAL_TABLET | Freq: Four times a day (QID) | ORAL | 0 refills | Status: DC

## 2021-07-28 MED ORDER — ONDANSETRON HCL 4 MG/2ML IJ SOLN
4.0000 mg | Freq: Once | INTRAMUSCULAR | Status: AC
  Administered 2021-07-28: 4 mg via INTRAVENOUS
  Filled 2021-07-28: qty 2

## 2021-07-28 MED ORDER — ONDANSETRON 4 MG PO TBDP
4.0000 mg | ORAL_TABLET | Freq: Once | ORAL | Status: AC
  Administered 2021-07-28: 4 mg via ORAL
  Filled 2021-07-28: qty 1

## 2021-07-28 MED ORDER — TETANUS-DIPHTH-ACELL PERTUSSIS 5-2.5-18.5 LF-MCG/0.5 IM SUSY
0.5000 mL | PREFILLED_SYRINGE | Freq: Once | INTRAMUSCULAR | Status: DC
  Filled 2021-07-28: qty 0.5

## 2021-07-28 MED ORDER — ACETAMINOPHEN 325 MG PO TABS
650.0000 mg | ORAL_TABLET | Freq: Once | ORAL | Status: AC
  Administered 2021-07-28: 650 mg via ORAL
  Filled 2021-07-28: qty 2

## 2021-07-28 MED ORDER — FENTANYL CITRATE PF 50 MCG/ML IJ SOSY
100.0000 ug | PREFILLED_SYRINGE | Freq: Once | INTRAMUSCULAR | Status: AC
  Administered 2021-07-28: 100 ug via INTRAVENOUS
  Filled 2021-07-28: qty 2

## 2021-07-28 MED ORDER — SODIUM CHLORIDE 0.9 % IV BOLUS
1000.0000 mL | Freq: Once | INTRAVENOUS | Status: AC
  Administered 2021-07-28: 1000 mL via INTRAVENOUS

## 2021-07-28 MED ORDER — KETOROLAC TROMETHAMINE 15 MG/ML IJ SOLN
15.0000 mg | Freq: Once | INTRAMUSCULAR | Status: AC
  Administered 2021-07-28: 15 mg via INTRAVENOUS
  Filled 2021-07-28: qty 1

## 2021-07-28 MED ORDER — HYDROCODONE-ACETAMINOPHEN 5-325 MG PO TABS: 1.0000 | ORAL_TABLET | Freq: Four times a day (QID) | ORAL | 0 refills | Status: DC | PRN

## 2021-07-28 MED ORDER — HYDROCODONE-ACETAMINOPHEN 5-325 MG PO TABS
1.0000 | ORAL_TABLET | Freq: Once | ORAL | Status: AC
  Administered 2021-07-28: 1 via ORAL
  Filled 2021-07-28: qty 1

## 2021-07-28 MED ORDER — BACITRACIN ZINC 500 UNIT/GM EX OINT
TOPICAL_OINTMENT | Freq: Two times a day (BID) | CUTANEOUS | Status: DC
  Filled 2021-07-28: qty 1.8
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
## 2021-07-28 NOTE — Discharge Instructions (Addendum)
Use bacitracin twice daily on the areas of the burn.  Apply nonadherent dressings with gauze over top of them.  Keep the blisters intact.  Call the burn center on Monday to schedule an appointment for follow-up within the next week for reassessment.    Prescription given for Norco. Take medication as directed and do not operate machinery, drive a car, or work while taking this medication as it can make you drowsy.   Please return to the emergency department for any new or worsening symptoms in the meantime including any fevers, uncontrolled pain, signs of infection or other concerns
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
## 2021-07-30 ENCOUNTER — Encounter: Payer: Self-pay | Admitting: Internal Medicine

## 2021-07-31 ENCOUNTER — Other Ambulatory Visit: Payer: Self-pay | Admitting: Internal Medicine

## 2021-08-01 ENCOUNTER — Encounter: Payer: Self-pay | Admitting: Family Medicine

## 2021-08-01 ENCOUNTER — Ambulatory Visit (INDEPENDENT_AMBULATORY_CARE_PROVIDER_SITE_OTHER): Payer: 59 | Admitting: Family Medicine

## 2021-08-01 VITALS — BP 138/82 | HR 70 | Temp 99.4°F | Wt 160.0 lb

## 2021-08-01 DIAGNOSIS — M79642 Pain in left hand: Secondary | ICD-10-CM

## 2021-08-01 DIAGNOSIS — T23252D Burn of second degree of left palm, subsequent encounter: Secondary | ICD-10-CM | POA: Diagnosis not present
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
## 2021-08-26 ENCOUNTER — Other Ambulatory Visit: Payer: Self-pay | Admitting: Internal Medicine

## 2021-09-07 ENCOUNTER — Other Ambulatory Visit: Payer: Self-pay | Admitting: Gastroenterology

## 2021-09-10 ENCOUNTER — Telehealth: Payer: Self-pay

## 2021-09-10 NOTE — Telephone Encounter (Signed)
Humira approved through to 09/10/2022 [id number]

## 2021-09-10 NOTE — Telephone Encounter (Signed)
PA submitted through Cover My Meds for the Humira.

## 2021-09-17 ENCOUNTER — Encounter: Payer: Self-pay | Admitting: Internal Medicine

## 2021-09-18 ENCOUNTER — Telehealth (INDEPENDENT_AMBULATORY_CARE_PROVIDER_SITE_OTHER): Payer: 59 | Admitting: Family

## 2021-09-18 ENCOUNTER — Other Ambulatory Visit (INDEPENDENT_AMBULATORY_CARE_PROVIDER_SITE_OTHER): Payer: 59

## 2021-09-18 DIAGNOSIS — N390 Urinary tract infection, site not specified: Secondary | ICD-10-CM | POA: Diagnosis not present

## 2021-09-18 MED ORDER — PHENAZOPYRIDINE HCL 200 MG PO TABS: 200.0000 mg | ORAL_TABLET | Freq: Three times a day (TID) | ORAL | 0 refills | Status: DC | PRN

## 2021-09-18 MED ORDER — NITROFURANTOIN MONOHYD MACRO 100 MG PO CAPS: 100.0000 mg | ORAL_CAPSULE | Freq: Two times a day (BID) | ORAL | 0 refills | Status: DC

## 2021-09-18 NOTE — Progress Notes (Signed)
[name] is a 56 y.o. female with the following history as recorded in EpicCare:  Patient Active Problem List   Diagnosis Date Noted   Sleep difficulties 03/20/2021   High vitamin D level 03/19/2021   Rib pain on left side 06/01/2019   Prediabetes 03/14/2018   Cervicogenic headache 03/11/2018   History of breast cancer 01/24/2017   Overactive bladder 01/24/2017   PVC (premature ventricular contraction) 07/15/2016   Anxiety associated with depression 11/09/2013   Crohn's colitis ([place]) 11/09/2012   Osteopenia 10/29/2012   Hypothyroidism 04/22/2012   GOITER, MULTINODULAR 07/14/2007   INCONTINENCE, URGE 10/09/2006    Current Outpatient Medications  Medication Sig Dispense Refill   nitrofurantoin, macrocrystal-monohydrate, (MACROBID) 100 MG capsule Take 1 capsule (100 mg total) by mouth 2 (two) times daily. 10 capsule 0   phenazopyridine (PYRIDIUM) 200 MG tablet Take 1 tablet (200 mg total) by mouth 3 (three) times daily as needed for pain. 10 tablet 0   b complex vitamins capsule Take 1 capsule by mouth at bedtime.      bacitracin ointment Apply 1 application topically 2 (two) times daily. 120 g 0   Biotin 5000 MCG CAPS Take 5,000 mcg by mouth 2 (two) times daily.      Calcium Carb-Cholecalciferol (CALCIUM + D3 PO) Take 1 tablet by mouth at bedtime.      cetirizine (ZYRTEC) 10 MG tablet Take 10 mg by mouth daily.     clotrimazole-betamethasone (LOTRISONE) cream Apply 1 application topically daily. 30 g 2   colestipol (COLESTID) 1 g tablet TAKE 1 TABLET BY MOUTH  TWICE DAILY 180 tablet 3   dicyclomine (BENTYL) 10 MG capsule Take 1 capsule (10 mg total) by mouth 3 (three) times daily before meals. As needed 90 capsule 3   escitalopram (LEXAPRO) 10 MG tablet TAKE 1 TABLET BY MOUTH  DAILY 90 tablet 3   fluticasone (FLONASE) 50 MCG/ACT nasal spray Place 2 sprays into both nostrils daily. In each nostril as needed (Patient taking differently: Place 2 sprays into both nostrils daily as  needed for allergies.) 16 g 2   HUMIRA PEN 40 MG/0.4ML PNKT INJECT 40MG SUBCUTANEOUSLY  EVERY 2 WEEKS 2 each 3   HYDROcodone-acetaminophen (NORCO/VICODIN) 5-325 MG tablet Take 1 tablet by mouth every 6 (six) hours as needed. 12 tablet 0   hyoscyamine (LEVSIN SL) 0.125 MG SL tablet Use 1 tablet daily as needed for rectal spasms 30 tablet 0   ibuprofen (ADVIL,MOTRIN) 200 MG tablet Take 600 mg by mouth every 6 (six) hours as needed for moderate pain.      levothyroxine (SYNTHROID) 88 MCG tablet TAKE ONE TABLET BY MOUTH DAILY BEFORE BREAKFAST 90 tablet 2   MELATONIN PO Take 12 mg by mouth at bedtime.     Multiple Vitamin (MULTIVITAMIN) tablet Take 1 tablet by mouth daily.     ondansetron (ZOFRAN) 4 MG tablet Take 1 tablet (4 mg total) by mouth every 6 (six) hours. 12 tablet 0   traZODone (DESYREL) 50 MG tablet TAKE 1/2 TO 1 TABLET BY  MOUTH AT BEDTIME AS NEEDED  FOR SLEEP 90 tablet 1   VITAMIN E PO Take 1 capsule by mouth daily.      No current facility-administered medications for this visit.    Allergies: Sulfa antibiotics  Past Medical History:  Diagnosis Date   Anxiety    Asthma    Chronic headaches    Granulomatous colitis ([place])    History of bone density study    no osteopenia  History of MRI of cervical spine 4/03   mild DJD   HX: breast cancer 11/98 and 12/03   recurrance   Hypothyroidism    IBS (irritable bowel syndrome)    Inguinal hernia    Personal history of chemotherapy    Personal history of radiation therapy     Past Surgical History:  Procedure Laterality Date   INGUINAL HERNIA REPAIR Right    MASTECTOMY Right 2010   TOTAL ABDOMINAL HYSTERECTOMY W/ BILATERAL SALPINGOOPHORECTOMY  2007   left cervix in place    Family History  Problem Relation Age of Onset   Hypertension Mother    Colon polyps Mother    Irritable bowel syndrome Mother    Skin cancer Father    Melanoma Father    Breast cancer Paternal Aunt    Colon polyps Brother    Psoriasis Brother     Dermatomyositis Brother    Diabetes Neg Hx    Heart disease Neg Hx    Stroke Neg Hx    Stomach cancer Neg Hx    Colon cancer Neg Hx    Esophageal cancer Neg Hx    Pancreatic cancer Neg Hx     Social History   Tobacco Use   Smoking status: Never   Smokeless tobacco: Never  Substance Use Topics   Alcohol use: Yes    Comment: occas    Subjective:    I connected with [name] on 09/18/21 at  3:40 PM EST by a video enabled telemedicine application and verified that I am speaking with the correct person using two identifiers.   I discussed the limitations of evaluation and management by telemedicine and the availability of in person appointments. The patient expressed understanding and agreed to proceed. Provider in office/ patient is at home; provider and patient are only 2 people on video call.   Concerned for possible UTI; denies any burning or frequency but is "acutely aware of her bladder." Feels like it is cramping/ spasming; has experienced this in the past and had UTI; did have increased amount of diarrhea secondary to onset of symptoms;    Objective:  There were no vitals filed for this visit.  General: Well developed, well nourished, in no acute distress  Skin : Warm and dry.  Head: Normocephalic and atraumatic  Lungs: Respirations unlabored;  Neurologic: Alert and oriented; speech intact; face symmetrical; moves all extremities well; CNII-XII intact without focal deficit   Assessment:  1. Urinary tract infection without hematuria, site unspecified     Plan:  Patient will got [facility] lab today and get U/a and urine culture done; will start Macrobid and Pyridium; follow up to be determined; if culture is negative, will need to consider imaging and/or in person follow up with her PCP;   No follow-ups on file.  Orders Placed This Encounter  Procedures   Urine Culture    Standing Status:   Future    Number of Occurrences:   1    Standing Expiration Date:   09/18/2022    Urinalysis    Standing Status:   Future    Number of Occurrences:   1    Standing Expiration Date:   09/18/2022    Requested Prescriptions   Signed Prescriptions Disp Refills   nitrofurantoin, macrocrystal-monohydrate, (MACROBID) 100 MG capsule 10 capsule 0    Sig: Take 1 capsule (100 mg total) by mouth 2 (two) times daily.   phenazopyridine (PYRIDIUM) 200 MG tablet 10 tablet 0    Sig:  Take 1 tablet (200 mg total) by mouth 3 (three) times daily as needed for pain.

## 2021-09-19 LAB — URINALYSIS
Bilirubin Urine: NEGATIVE
Hgb urine dipstick: NEGATIVE
Ketones, ur: NEGATIVE
Leukocytes,Ua: NEGATIVE
Nitrite: NEGATIVE
Specific Gravity, Urine: <=1.005 — AB (ref 1.000–1.030)
Total Protein, Urine: NEGATIVE
Urine Glucose: NEGATIVE
Urobilinogen, UA: 0.2 (ref 0.0–1.0)
pH: 6.5 (ref 5.0–8.0)

## 2021-09-19 LAB — URINE CULTURE
MICRO NUMBER:: 12974698
SPECIMEN QUALITY:: ADEQUATE

## 2021-09-21 ENCOUNTER — Ambulatory Visit
Admission: RE | Admit: 2021-09-21 | Discharge: 2021-09-21 | Disposition: A | Discharge status: Home / Self Care | Payer: 59 | Source: Ambulatory Visit | Attending: Oncology | Admitting: Oncology

## 2021-09-21 DIAGNOSIS — Z853 Personal history of malignant neoplasm of breast: Secondary | ICD-10-CM

## 2021-09-24 ENCOUNTER — Telehealth: Payer: Self-pay | Admitting: *Deleted

## 2021-09-24 ENCOUNTER — Other Ambulatory Visit: Payer: Self-pay | Admitting: Oncology

## 2021-09-24 ENCOUNTER — Encounter: Payer: Self-pay | Admitting: Oncology

## 2021-09-24 DIAGNOSIS — R928 Other abnormal and inconclusive findings on diagnostic imaging of breast: Secondary | ICD-10-CM

## 2021-09-24 NOTE — Telephone Encounter (Signed)
[name] concerned by mammogram report re: asymmetry in left breast. Per Dr. Benay Spice: this is a common finding and is usually not of concern. Does encourage her to have the follow-up imaging suggested and let office not date of imaging and our office will f/u as well.  This RN called [facility] and made scheduling aware of her concern/worry. They will work on getting this set up soon for her.

## 2021-10-04 ENCOUNTER — Encounter: Payer: Self-pay | Admitting: Internal Medicine

## 2021-10-08 ENCOUNTER — Other Ambulatory Visit: Payer: Self-pay

## 2021-10-08 MED ORDER — LEVOTHYROXINE SODIUM 88 MCG PO TABS: ORAL_TABLET | ORAL | 2 refills | Status: DC

## 2021-10-17 ENCOUNTER — Ambulatory Visit
Admission: RE | Admit: 2021-10-17 | Discharge: 2021-10-17 | Disposition: A | Discharge status: Home / Self Care | Payer: 59 | Source: Ambulatory Visit | Attending: Oncology | Admitting: Oncology

## 2021-10-17 ENCOUNTER — Other Ambulatory Visit: Payer: Self-pay

## 2021-10-17 ENCOUNTER — Other Ambulatory Visit: Payer: Self-pay | Admitting: Oncology

## 2021-10-17 DIAGNOSIS — R928 Other abnormal and inconclusive findings on diagnostic imaging of breast: Secondary | ICD-10-CM

## 2021-10-26 ENCOUNTER — Other Ambulatory Visit (HOSPITAL_COMMUNITY): Payer: Self-pay | Admitting: Diagnostic Radiology

## 2021-10-26 ENCOUNTER — Ambulatory Visit
Admission: RE | Admit: 2021-10-26 | Discharge: 2021-10-26 | Disposition: A | Discharge status: Home / Self Care | Payer: 59 | Source: Ambulatory Visit | Attending: Oncology | Admitting: Oncology

## 2021-10-26 DIAGNOSIS — R928 Other abnormal and inconclusive findings on diagnostic imaging of breast: Secondary | ICD-10-CM

## 2022-01-23 ENCOUNTER — Other Ambulatory Visit: Payer: Self-pay | Admitting: Internal Medicine

## 2022-01-24 ENCOUNTER — Other Ambulatory Visit: Payer: Self-pay | Admitting: Internal Medicine

## 2022-02-22 ENCOUNTER — Inpatient Hospital Stay: Payer: 59 | Attending: Oncology | Admitting: Oncology

## 2022-02-22 VITALS — BP 133/91 | HR 65 | Temp 98.1°F | Resp 18 | Ht 66.0 in | Wt 157.6 lb

## 2022-02-22 DIAGNOSIS — Z170 Estrogen receptor positive status [ER+]: Secondary | ICD-10-CM | POA: Diagnosis not present

## 2022-02-22 DIAGNOSIS — Z9011 Acquired absence of right breast and nipple: Secondary | ICD-10-CM | POA: Insufficient documentation

## 2022-02-22 DIAGNOSIS — Z853 Personal history of malignant neoplasm of breast: Secondary | ICD-10-CM | POA: Diagnosis present

## 2022-02-22 DIAGNOSIS — Z9223 Personal history of estrogen therapy: Secondary | ICD-10-CM | POA: Insufficient documentation

## 2022-02-22 NOTE — Progress Notes (Signed)
  [facility] OFFICE PROGRESS NOTE   Diagnosis: Breast cancer  INTERVAL HISTORY:   Ms. Mergenthaler returns as scheduled.  She feels well.  She has intermittent flares of colitis.  She had a screening left mammogram on 09/21/2021.  There was a possible asymmetry.  A diagnostic mammogram and ultrasound 10/17/2021 revealed a possible left breast mass measuring 3 mm.  Cysts were noted throughout the medial left breast with a cluster of cysts at the 9 o'clock position favored to correlate with the mammography finding.  No axillary adenopathy.  She underwent an ultrasound-guided biopsy of the mass at 9:00 of the left breast on 10/26/2021.  The pathology revealed fibrocystic change with intervening stromal fibrosis of the left breast.  A 61-monthfollow-up left breast mammogram and ultrasound are recommended.  Objective:  Vital signs in last 24 hours:  Blood pressure (!) 133/91, pulse 65, temperature 98.1 F (36.7 C), temperature source Oral, resp. rate 18, height 5' 6"  (1.676 m), weight 157 lb 9.6 oz (71.5 kg), SpO2 98 %.    Lymphatics: No cervical, supraclavicular, or axillary Cardio: Regular rate and rhythm GI: No hepatosplenomegaly Vascular: No leg edema Breast: Right mastectomy with a TRAM reconstruction.  No evidence for chest wall tumor recurrence.  Left breast without mass.  Both axillae appear benign.  Lab Results:  Lab Results  Component Value Date   WBC 6.9 07/28/2021   HGB 12.5 07/28/2021   HCT 39.1 07/28/2021   MCV 85.0 07/28/2021   PLT 233 07/28/2021   NEUTROABS 3.5 07/28/2021    CMP  Lab Results  Component Value Date   NA 140 07/28/2021   K 3.9 07/28/2021   CL 104 07/28/2021   CO2 26 07/28/2021   GLUCOSE 111 (H) 07/28/2021   BUN 22 (H) 07/28/2021   CREATININE 1.10 (H) 07/28/2021   CALCIUM 9.5 07/28/2021   PROT 7.4 03/20/2021   ALBUMIN 4.6 03/20/2021   AST 18 03/20/2021   ALT 13 03/20/2021   ALKPHOS 79 03/20/2021   BILITOT 0.5 03/20/2021   GFRNONAA 59  (L) 07/28/2021   GFRAA >60 03/30/2011     Medications: I have reviewed the patient's current medications.   Assessment/Plan: Ms. CGaddiewas diagnosed with right-sided breast cancer in November 1998. She developed a right breast recurrence in December 2003 and underwent a right mastectomy/TRAM reconstruction. She completed adjuvant AC and Taxol chemotherapy. She took tamoxifen August 2004 through July 2009. She began Femara 04/12/2008, discontinued at the end of 2014.   She underwent an ultrasound-guided biopsy of a suspicious lesion at the 9 o'clock position of the left breast earlier this year.  The pathology returned benign.   Disposition: Ms. CGebbiais in remission from breast cancer.  She will return for an office visit in 1 year.  She will be scheduled for a left breast mammogram and ultrasound in September.  GBetsy Coder MD  02/22/2022  12:02 PM

## 2022-03-07 ENCOUNTER — Encounter: Payer: Self-pay | Admitting: Gastroenterology

## 2022-03-18 ENCOUNTER — Encounter: Payer: Self-pay | Admitting: Gastroenterology

## 2022-03-27 ENCOUNTER — Encounter: Payer: Self-pay | Admitting: Internal Medicine

## 2022-03-27 NOTE — Progress Notes (Signed)
Subjective:    Patient ID: [name], female    DOB: [birth year], 56 y.o.   MRN: [id number]      HPI [name] is here for a Physical exam.   Fatigue is worse than usual.  Feels more susceptible to cold.  Takes 2-4 hrs of naps daily.  She typically sleeps 8-10 hours a night.  She has had a sleep study years ago that was normal.  She takes calcium plus D, MVI, vitamin E, B complex vitamin.     Medications and allergies reviewed with patient and updated if appropriate.  Current Outpatient Medications on File Prior to Visit  Medication Sig Dispense Refill   b complex vitamins capsule Take 1 capsule by mouth at bedtime.      Biotin 5000 MCG CAPS Take 5,000 mcg by mouth 2 (two) times daily.      Calcium Carb-Cholecalciferol (CALCIUM + D3 PO) Take 1 tablet by mouth at bedtime.      cetirizine (ZYRTEC) 10 MG tablet Take 10 mg by mouth daily.     clotrimazole-betamethasone (LOTRISONE) cream Apply 1 application topically daily. (Patient taking differently: Apply 1 application  topically as needed.) 30 g 2   escitalopram (LEXAPRO) 10 MG tablet TAKE 1 TABLET BY MOUTH  DAILY 90 tablet 3   fluticasone (FLONASE) 50 MCG/ACT nasal spray Place 2 sprays into both nostrils daily. In each nostril as needed (Patient taking differently: Place 2 sprays into both nostrils daily as needed for allergies.) 16 g 2   HUMIRA PEN 40 MG/0.4ML PNKT INJECT 40MG SUBCUTANEOUSLY  EVERY 2 WEEKS 2 each 3   hyoscyamine (LEVSIN SL) 0.125 MG SL tablet Use 1 tablet daily as needed for rectal spasms (Patient taking differently: as needed. Use 1 tablet daily as needed for rectal spasms) 30 tablet 0   ibuprofen (ADVIL,MOTRIN) 200 MG tablet Take 600 mg by mouth every 6 (six) hours as needed for moderate pain.      levothyroxine (SYNTHROID) 88 MCG tablet TAKE ONE TABLET BY MOUTH DAILY BEFORE BREAKFAST 90 tablet 2   Multiple Vitamin (MULTIVITAMIN) tablet Take 1 tablet by mouth daily.     traZODone (DESYREL) 50 MG tablet TAKE  1/2 TO 1 TABLET BY MOUTH AT BEDTIME AS NEEDED FOR SLEEP 90 tablet 3   VITAMIN E PO Take 1 capsule by mouth daily.      No current facility-administered medications on file prior to visit.    Review of Systems  Constitutional:  Negative for fever.  Eyes:  Negative for visual disturbance.  Respiratory:  Positive for cough (occ) and shortness of breath (going up more than flight of stairs). Negative for wheezing.   Cardiovascular:  Negative for chest pain, palpitations and leg swelling.  Gastrointestinal:  Positive for constipation (mild). Negative for abdominal pain, blood in stool, diarrhea and nausea.       Rare GERD  Endocrine: Positive for cold intolerance.  Genitourinary:  Negative for dysuria.  Musculoskeletal:  Positive for back pain (chronic). Negative for arthralgias.  Skin:  Negative for rash.  Neurological:  Negative for light-headedness, numbness and headaches.  Psychiatric/Behavioral:  Positive for dysphoric mood. The patient is nervous/anxious.        Objective:   Vitals:   04/01/22 1003  BP: 130/72  Pulse: 62  Temp: 98.3 F (36.8 C)  SpO2: 95%   Filed Weights   04/01/22 1003  Weight: 157 lb (71.2 kg)   Body mass index is 25.34 kg/m.  BP Readings from Last  3 Encounters:  04/01/22 130/72  02/22/22 (!) 133/91  08/01/21 138/82    Wt Readings from Last 3 Encounters:  04/01/22 157 lb (71.2 kg)  02/22/22 157 lb 9.6 oz (71.5 kg)  08/01/21 160 lb (72.6 kg)       Physical Exam Constitutional: She appears well-developed and well-nourished. No distress.  HENT:  Head: Normocephalic and atraumatic.  Right Ear: External ear normal. Normal ear canal and TM Left Ear: External ear normal.  Normal ear canal and TM Mouth/Throat: Oropharynx is clear and moist.  Eyes: Conjunctivae normal.  Neck: Neck supple. No tracheal deviation present. No thyromegaly present.  No carotid bruit  Cardiovascular: Normal rate, regular rhythm and normal heart sounds.   No murmur  heard.  No edema. Pulmonary/Chest: Effort normal and breath sounds normal. No respiratory distress. She has no wheezes. She has no rales.  Breast: deferred   Abdominal: Soft. She exhibits no distension. There is no tenderness.  Lymphadenopathy: She has no cervical adenopathy.  Skin: Skin is warm and dry. She is not diaphoretic.  Psychiatric: She has a normal mood and affect. Her behavior is normal.     Lab Results  Component Value Date   WBC 6.9 07/28/2021   HGB 12.5 07/28/2021   HCT 39.1 07/28/2021   PLT 233 07/28/2021   GLUCOSE 111 (H) 07/28/2021   CHOL 237 (H) 03/20/2021   TRIG 117.0 03/20/2021   HDL 66.00 03/20/2021   LDLCALC 148 (H) 03/20/2021   ALT 13 03/20/2021   AST 18 03/20/2021   NA 140 07/28/2021   K 3.9 07/28/2021   CL 104 07/28/2021   CREATININE 1.10 (H) 07/28/2021   BUN 22 (H) 07/28/2021   CO2 26 07/28/2021   TSH 0.70 03/20/2021   HGBA1C 6.0 03/20/2021         Assessment & Plan:   Physical exam: Screening blood work  ordered Exercise  walking regularly Weight  good  Substance abuse  none   Reviewed recommended immunizations.   Health Maintenance  Topic Date Due   Zoster Vaccines- Shingrix (1 of 2) Never done   COVID-19 Vaccine (3 - Pfizer series) 02/08/2021   COLONOSCOPY (Pts 45-27yr Insurance coverage will need to be confirmed)  02/17/2022   INFLUENZA VACCINE  11/10/2022 (Originally 03/12/2022)   DEXA SCAN  09/05/2022   MAMMOGRAM  09/22/2023   TETANUS/TDAP  01/18/2030   Hepatitis C Screening  Completed   HIV Screening  Completed   HPV VACCINES  Aged Out          See Problem List for Assessment and Plan of chronic medical problems.

## 2022-03-27 NOTE — Patient Instructions (Addendum)
An EKG was done today.   It is normal.     Blood work was ordered.     Medications changes include :   none    Return in about 1 year (around 04/02/2023) for Physical Exam.   Health Maintenance, Female Adopting a healthy lifestyle and getting preventive care are important in promoting health and wellness. Ask your health care provider about: The right schedule for you to have regular tests and exams. Things you can do on your own to prevent diseases and keep yourself healthy. What should I know about diet, weight, and exercise? Eat a healthy diet  Eat a diet that includes plenty of vegetables, fruits, low-fat dairy products, and lean protein. Do not eat a lot of foods that are high in solid fats, added sugars, or sodium. Maintain a healthy weight Body mass index (BMI) is used to identify weight problems. It estimates body fat based on height and weight. Your health care provider can help determine your BMI and help you achieve or maintain a healthy weight. Get regular exercise Get regular exercise. This is one of the most important things you can do for your health. Most adults should: Exercise for at least 150 minutes each week. The exercise should increase your heart rate and make you sweat (moderate-intensity exercise). Do strengthening exercises at least twice a week. This is in addition to the moderate-intensity exercise. Spend less time sitting. Even light physical activity can be beneficial. Watch cholesterol and blood lipids Have your blood tested for lipids and cholesterol at 57 years of age, then have this test every 5 years. Have your cholesterol levels checked more often if: Your lipid or cholesterol levels are high. You are older than 57 years of age. You are at high risk for heart disease. What should I know about cancer screening? Depending on your health history and family history, you may need to have cancer screening at various ages. This may include  screening for: Breast cancer. Cervical cancer. Colorectal cancer. Skin cancer. Lung cancer. What should I know about heart disease, diabetes, and high blood pressure? Blood pressure and heart disease High blood pressure causes heart disease and increases the risk of stroke. This is more likely to develop in people who have high blood pressure readings or are overweight. Have your blood pressure checked: Every 3-5 years if you are 2-15 years of age. Every year if you are 72 years old or older. Diabetes Have regular diabetes screenings. This checks your fasting blood sugar level. Have the screening done: Once every three years after age 28 if you are at a normal weight and have a low risk for diabetes. More often and at a younger age if you are overweight or have a high risk for diabetes. What should I know about preventing infection? Hepatitis B If you have a higher risk for hepatitis B, you should be screened for this virus. Talk with your health care provider to find out if you are at risk for hepatitis B infection. Hepatitis C Testing is recommended for: Everyone born from [birth year] through 1966. Anyone with known risk factors for hepatitis C. Sexually transmitted infections (STIs) Get screened for STIs, including gonorrhea and chlamydia, if: You are sexually active and are younger than 57 years of age. You are older than 57 years of age and your health care provider tells you that you are at risk for this type of infection. Your sexual activity has changed since you were last screened, and  you are at increased risk for chlamydia or gonorrhea. Ask your health care provider if you are at risk. Ask your health care provider about whether you are at high risk for HIV. Your health care provider may recommend a prescription medicine to help prevent HIV infection. If you choose to take medicine to prevent HIV, you should first get tested for HIV. You should then be tested every 3 months for as  long as you are taking the medicine. Pregnancy If you are about to stop having your period (premenopausal) and you may become pregnant, seek counseling before you get pregnant. Take 400 to 800 micrograms (mcg) of folic acid every day if you become pregnant. Ask for birth control (contraception) if you want to prevent pregnancy. Osteoporosis and menopause Osteoporosis is a disease in which the bones lose minerals and strength with aging. This can result in bone fractures. If you are 50 years old or older, or if you are at risk for osteoporosis and fractures, ask your health care provider if you should: Be screened for bone loss. Take a calcium or vitamin D supplement to lower your risk of fractures. Be given hormone replacement therapy (HRT) to treat symptoms of menopause. Follow these instructions at home: Alcohol use Do not drink alcohol if: Your health care provider tells you not to drink. You are pregnant, may be pregnant, or are planning to become pregnant. If you drink alcohol: Limit how much you have to: 0-1 drink a day. Know how much alcohol is in your drink. In the [place] one drink equals one 12 oz bottle of beer (355 mL), one 5 oz glass of wine (148 mL), or one 1 oz glass of hard liquor (44 mL). Lifestyle Do not use any products that contain nicotine or tobacco. These products include cigarettes, chewing tobacco, and vaping devices, such as e-cigarettes. If you need help quitting, ask your health care provider. Do not use street drugs. Do not share needles. Ask your health care provider for help if you need support or information about quitting drugs. General instructions Schedule regular health, dental, and eye exams. Stay current with your vaccines. Tell your health care provider if: You often feel depressed. You have ever been abused or do not feel safe at home. Summary Adopting a healthy lifestyle and getting preventive care are important in promoting health and  wellness. Follow your health care provider's instructions about healthy diet, exercising, and getting tested or screened for diseases. Follow your health care provider's instructions on monitoring your cholesterol and blood pressure. This information is not intended to replace advice given to you by your health care provider. Make sure you discuss any questions you have with your health care provider. Document Revised: 12/18/2020 Document Reviewed: 12/18/2020 Elsevier Patient Education  [employer].

## 2022-03-28 ENCOUNTER — Ambulatory Visit
Admission: RE | Admit: 2022-03-28 | Discharge: 2022-03-28 | Disposition: A | Discharge status: Home / Self Care | Payer: 59 | Source: Ambulatory Visit | Attending: Oncology | Admitting: Oncology

## 2022-03-28 ENCOUNTER — Ambulatory Visit: Payer: 59

## 2022-03-28 DIAGNOSIS — Z853 Personal history of malignant neoplasm of breast: Secondary | ICD-10-CM

## 2022-04-01 ENCOUNTER — Ambulatory Visit (INDEPENDENT_AMBULATORY_CARE_PROVIDER_SITE_OTHER): Payer: 59 | Admitting: Internal Medicine

## 2022-04-01 VITALS — BP 130/72 | HR 62 | Temp 98.3°F | Ht 66.0 in | Wt 157.0 lb

## 2022-04-01 DIAGNOSIS — M85852 Other specified disorders of bone density and structure, left thigh: Secondary | ICD-10-CM

## 2022-04-01 DIAGNOSIS — M67479 Ganglion, unspecified ankle and foot: Secondary | ICD-10-CM

## 2022-04-01 DIAGNOSIS — R7303 Prediabetes: Secondary | ICD-10-CM

## 2022-04-01 DIAGNOSIS — I493 Ventricular premature depolarization: Secondary | ICD-10-CM

## 2022-04-01 DIAGNOSIS — E039 Hypothyroidism, unspecified: Secondary | ICD-10-CM

## 2022-04-01 DIAGNOSIS — F418 Other specified anxiety disorders: Secondary | ICD-10-CM | POA: Diagnosis not present

## 2022-04-01 DIAGNOSIS — G479 Sleep disorder, unspecified: Secondary | ICD-10-CM

## 2022-04-01 DIAGNOSIS — Z0000 Encounter for general adult medical examination without abnormal findings: Secondary | ICD-10-CM

## 2022-04-01 DIAGNOSIS — K5010 Crohn's disease of large intestine without complications: Secondary | ICD-10-CM | POA: Diagnosis not present

## 2022-04-01 DIAGNOSIS — R5382 Chronic fatigue, unspecified: Secondary | ICD-10-CM

## 2022-04-01 LAB — COMPREHENSIVE METABOLIC PANEL
ALT: 15 U/L (ref 0–35)
AST: 18 U/L (ref 0–37)
Albumin: 4.5 g/dL (ref 3.5–5.2)
Alkaline Phosphatase: 61 U/L (ref 39–117)
BUN: 14 mg/dL (ref 6–23)
CO2: 33 mEq/L — ABNORMAL HIGH (ref 19–32)
Calcium: 9.7 mg/dL (ref 8.4–10.5)
Chloride: 102 mEq/L (ref 96–112)
Creatinine, Ser: 0.61 mg/dL (ref 0.40–1.20)
GFR: 99.67 mL/min (ref >60.00)
Glucose, Bld: 90 mg/dL (ref 70–99)
Potassium: 3.9 mEq/L (ref 3.5–5.1)
Sodium: 141 mEq/L (ref 135–145)
Total Bilirubin: 0.3 mg/dL (ref 0.2–1.2)
Total Protein: 7 g/dL (ref 6.0–8.3)

## 2022-04-01 LAB — CBC WITH DIFFERENTIAL/PLATELET
Basophils Absolute: 0 10*3/uL (ref 0.0–0.1)
Basophils Relative: 0.8 % (ref 0.0–3.0)
Eosinophils Absolute: 0.2 10*3/uL (ref 0.0–0.7)
Eosinophils Relative: 3.1 % (ref 0.0–5.0)
HCT: 38.7 % (ref 36.0–46.0)
Hemoglobin: 12.8 g/dL (ref 12.0–15.0)
Lymphocytes Relative: 33.3 % (ref 12.0–46.0)
Lymphs Abs: 1.8 10*3/uL (ref 0.7–4.0)
MCHC: 33 g/dL (ref 30.0–36.0)
MCV: 84.2 fl (ref 78.0–100.0)
Monocytes Absolute: 0.5 10*3/uL (ref 0.1–1.0)
Monocytes Relative: 9.6 % (ref 3.0–12.0)
Neutro Abs: 2.9 10*3/uL (ref 1.4–7.7)
Neutrophils Relative %: 53.2 % (ref 43.0–77.0)
Platelets: 183 10*3/uL (ref 150.0–400.0)
RBC: 4.6 Mil/uL (ref 3.87–5.11)
RDW: 12.7 % (ref 11.5–15.5)
WBC: 5.4 10*3/uL (ref 4.0–10.5)

## 2022-04-01 LAB — T3, FREE: T3, Free: 3.1 pg/mL (ref 2.3–4.2)

## 2022-04-01 LAB — LIPID PANEL
Cholesterol: 199 mg/dL (ref 0–200)
HDL: 58.2 mg/dL (ref >39.00)
LDL Cholesterol: 121 mg/dL — ABNORMAL HIGH (ref 0–99)
NonHDL: 141.28
Total CHOL/HDL Ratio: 3
Triglycerides: 100 mg/dL (ref 0.0–149.0)
VLDL: 20 mg/dL (ref 0.0–40.0)

## 2022-04-01 LAB — VITAMIN B12: Vitamin B-12: 366 pg/mL (ref 211–911)

## 2022-04-01 LAB — T4, FREE: Free T4: 1.14 ng/dL (ref 0.60–1.60)

## 2022-04-01 LAB — VITAMIN D 25 HYDROXY (VIT D DEFICIENCY, FRACTURES): VITD: 71.62 ng/mL (ref 30.00–100.00)

## 2022-04-01 LAB — HEMOGLOBIN A1C: Hgb A1c MFr Bld: 6.2 % (ref 4.6–6.5)

## 2022-04-01 LAB — TSH: TSH: 1.21 u[IU]/mL (ref 0.35–5.50)

## 2022-04-01 NOTE — Assessment & Plan Note (Addendum)
Chronic Having fatigue, intolerance to cold Clinically sounds hypothyroid, but TSH has always been on the low side Check tfts Taking levothyroxine 88 mcg daily Will adjust medication dose if needed

## 2022-04-01 NOTE — Assessment & Plan Note (Signed)
Chronic Controlled On Humira Management per Dr Silverio Decamp

## 2022-04-01 NOTE — Assessment & Plan Note (Signed)
Chronic Controlled, stable Continue lexapro 10 mg daily

## 2022-04-01 NOTE — Assessment & Plan Note (Signed)
Chronic DEXA up-to-date Continue regular walking Continue multivitamin, vitamin D and calcium

## 2022-04-01 NOTE — Assessment & Plan Note (Signed)
Chronic fatigue She feels her fatigue has gotten worse She sleeps on average 8-10 hours of sleep at night and she feels like she is getting good sleep, but if sleep is not refreshing She does feel fatigued during the day and typically naps every day-often 2-4 hours She is taking her vitamins daily She does walk fairly regularly Check complete blood work today including full TFTs She did have a sleep study years ago apparently that was normal, but advised her that if her blood work is all normal I would recommend repeating sleep study to rule out sleep apnea/sleep disorder

## 2022-04-01 NOTE — Assessment & Plan Note (Addendum)
Chronic Controlled with trazodone  50 mg nightly sleeps 8-10 hrs on average - does not feel refreshed, naps 2-4 hrs daily

## 2022-04-01 NOTE — Assessment & Plan Note (Signed)
Chronic Check a1c Low sugar / carb diet Stressed regular exercise  

## 2022-04-01 NOTE — Assessment & Plan Note (Addendum)
History of PVC's No palpitations EKG sinus brady at 58 bpm, otherwise normal.  Compared to EKG from 2017 PVCs are no longer present and possible LAE not present.

## 2022-04-01 NOTE — Assessment & Plan Note (Signed)
Chronic Right dorsal foot-causing some burning and discomfort Will have it removed when she is ready-advised to contact the podiatrist

## 2022-04-02 ENCOUNTER — Encounter: Payer: Self-pay | Admitting: Gastroenterology

## 2022-04-18 ENCOUNTER — Other Ambulatory Visit: Payer: Self-pay

## 2022-04-18 ENCOUNTER — Other Ambulatory Visit: Payer: Self-pay | Admitting: Gastroenterology

## 2022-04-18 DIAGNOSIS — K5080 Crohn's disease of both small and large intestine without complications: Secondary | ICD-10-CM

## 2022-04-30 ENCOUNTER — Ambulatory Visit (AMBULATORY_SURGERY_CENTER): Payer: 59 | Admitting: *Deleted

## 2022-04-30 VITALS — Ht 66.0 in | Wt 156.4 lb

## 2022-04-30 DIAGNOSIS — Z8719 Personal history of other diseases of the digestive system: Secondary | ICD-10-CM

## 2022-04-30 MED ORDER — NA SULFATE-K SULFATE-MG SULF 17.5-3.13-1.6 GM/177ML PO SOLN: 1.0000 | Freq: Once | ORAL | 0 refills | Status: AC

## 2022-04-30 NOTE — Progress Notes (Signed)

## 2022-05-02 ENCOUNTER — Telehealth: Payer: Self-pay

## 2022-05-02 NOTE — Telephone Encounter (Signed)
Patient did not read message sent through My Chart. Called the patient to advise she is due labs, including the TB screening. Patient agrees to have this done asap. She plans to get this done after the colonoscopy in October.  Advised her the office visit is past due as well. Last office visit was 08/2020.  (She said it felt like she was just here) Patient is on Humira.

## 2022-05-03 ENCOUNTER — Other Ambulatory Visit: Payer: Self-pay | Admitting: Internal Medicine

## 2022-05-04 ENCOUNTER — Other Ambulatory Visit: Payer: Self-pay | Admitting: Gastroenterology

## 2022-05-16 ENCOUNTER — Encounter: Payer: Self-pay | Admitting: Gastroenterology

## 2022-05-20 ENCOUNTER — Encounter: Payer: Self-pay | Admitting: Certified Registered Nurse Anesthetist

## 2022-05-23 ENCOUNTER — Encounter: Payer: 59 | Admitting: Gastroenterology

## 2022-05-28 ENCOUNTER — Encounter: Payer: Self-pay | Admitting: Gastroenterology

## 2022-05-28 ENCOUNTER — Ambulatory Visit (AMBULATORY_SURGERY_CENTER): Payer: 59 | Admitting: Gastroenterology

## 2022-05-28 VITALS — BP 132/68 | HR 60 | Temp 98.0°F | Resp 12 | Ht 66.0 in | Wt 156.4 lb

## 2022-05-28 DIAGNOSIS — Z1211 Encounter for screening for malignant neoplasm of colon: Secondary | ICD-10-CM

## 2022-05-28 DIAGNOSIS — K635 Polyp of colon: Secondary | ICD-10-CM | POA: Diagnosis not present

## 2022-05-28 DIAGNOSIS — D123 Benign neoplasm of transverse colon: Secondary | ICD-10-CM

## 2022-05-28 DIAGNOSIS — K5080 Crohn's disease of both small and large intestine without complications: Secondary | ICD-10-CM

## 2022-05-28 MED ORDER — SODIUM CHLORIDE 0.9 % IV SOLN: 500.0000 mL | Freq: Once | INTRAVENOUS | Status: DC

## 2022-05-28 NOTE — Progress Notes (Unsigned)
0848 Patient experiencing nausea and retching.  MD updated and Zofran 4 mg IV given, vss

## 2022-05-28 NOTE — Progress Notes (Signed)
Pt's states no medical or surgical changes since previsit or office visit. 

## 2022-05-28 NOTE — Progress Notes (Unsigned)
[facility] History and Physical   Primary Care Physician:  Binnie Rail, MD   Reason for Procedure:  Crohn's disease with involvement of colon, surveillance for colorectal cancer  Plan:    Colonoscopy with possible interventions as needed     HPI: [name] is a very pleasant 56 y.o. female here for surveillance colonoscopy due to h/o crohn's colitis.   The risks and benefits as well as alternatives of endoscopic procedure(s) have been discussed and reviewed. All questions answered. The patient agrees to proceed.    Past Medical History:  Diagnosis Date   Anxiety    Asthma    Cancer ([place])    breast right x 2   Chronic headaches    Granulomatous colitis ([place])    Heart murmur    History of bone density study    no osteopenia   History of MRI of cervical spine 11/2001   mild DJD   HX: breast cancer 11/98 and 12/03   recurrance   Hypothyroidism    IBS (irritable bowel syndrome)    Inguinal hernia    Personal history of chemotherapy    Personal history of radiation therapy    Sleep apnea    "mild"    Past Surgical History:  Procedure Laterality Date   INGUINAL HERNIA REPAIR Right    MASTECTOMY Right 2010   TOTAL ABDOMINAL HYSTERECTOMY W/ BILATERAL SALPINGOOPHORECTOMY  2007   left cervix in place    Prior to Admission medications   Medication Sig Start Date End Date Taking? Authorizing Provider  b complex vitamins capsule Take 1 capsule by mouth at bedtime.    Yes [provider]  Biotin 5000 MCG CAPS Take 5,000 mcg by mouth daily.   Yes [provider]  Calcium Carb-Cholecalciferol (CALCIUM + D3 PO) Take 1 tablet by mouth at bedtime.    Yes [provider]  cetirizine (ZYRTEC) 10 MG tablet Take 10 mg by mouth daily.   Yes [provider]  escitalopram (LEXAPRO) 10 MG tablet TAKE 1 TABLET BY MOUTH  DAILY 08/27/21  Yes Burns, Claudina Lick, MD  levothyroxine (SYNTHROID) 88 MCG tablet TAKE 1 TABLET BY MOUTH DAILY   BEFORE BREAKFAST 05/06/22  Yes Burns, Claudina Lick, MD  traZODone (DESYREL) 50 MG tablet TAKE 1/2 TO 1 TABLET BY MOUTH AT BEDTIME AS NEEDED FOR SLEEP 01/23/22  Yes Burns, Claudina Lick, MD  VITAMIN E PO Take 1 capsule by mouth daily.    Yes [provider]  clotrimazole-betamethasone (LOTRISONE) cream Apply 1 application topically daily. Patient taking differently: Apply 1 application  topically as needed. 03/20/21   Binnie Rail, MD  colestipol (COLESTID) 1 g tablet TAKE 1 TABLET BY MOUTH TWICE  DAILY 05/06/22   Mauri Pole, MD  fluticasone (FLONASE) 50 MCG/ACT nasal spray Place 2 sprays into both nostrils daily. In each nostril as needed Patient taking differently: Place 2 sprays into both nostrils daily as needed for allergies. 09/04/16   Kinnie Feil, MD  HUMIRA PEN 40 MG/0.4ML PNKT INJECT 40MG SUBCUTANEOUSLY EVERY 2 WEEKS 04/18/22   Mauri Pole, MD  hyoscyamine (LEVSIN SL) 0.125 MG SL tablet Use 1 tablet daily as needed for rectal spasms Patient taking differently: as needed. Use 1 tablet daily as needed for rectal spasms 10/01/19   Mauri Pole, MD  ibuprofen (ADVIL,MOTRIN) 200 MG tablet Take 600 mg by mouth every 6 (six) hours as needed for moderate pain.     [provider]  Multiple Vitamin (MULTIVITAMIN) tablet Take 1 tablet by mouth daily.    [provider]    Current Outpatient Medications  Medication Sig Dispense Refill   b complex vitamins capsule Take 1 capsule by mouth at bedtime.      Biotin 5000 MCG CAPS Take 5,000 mcg by mouth daily.     Calcium Carb-Cholecalciferol (CALCIUM + D3 PO) Take 1 tablet by mouth at bedtime.      cetirizine (ZYRTEC) 10 MG tablet Take 10 mg by mouth daily.     escitalopram (LEXAPRO) 10 MG tablet TAKE 1 TABLET BY MOUTH  DAILY 90 tablet 3   levothyroxine (SYNTHROID) 88 MCG tablet TAKE 1 TABLET BY MOUTH DAILY  BEFORE BREAKFAST 90 tablet 3   traZODone (DESYREL) 50 MG tablet TAKE 1/2 TO 1 TABLET BY MOUTH AT BEDTIME  AS NEEDED FOR SLEEP 90 tablet 3   VITAMIN E PO Take 1 capsule by mouth daily.      clotrimazole-betamethasone (LOTRISONE) cream Apply 1 application topically daily. (Patient taking differently: Apply 1 application  topically as needed.) 30 g 2   colestipol (COLESTID) 1 g tablet TAKE 1 TABLET BY MOUTH TWICE  DAILY 180 tablet 0   fluticasone (FLONASE) 50 MCG/ACT nasal spray Place 2 sprays into both nostrils daily. In each nostril as needed (Patient taking differently: Place 2 sprays into both nostrils daily as needed for allergies.) 16 g 2   HUMIRA PEN 40 MG/0.4ML PNKT INJECT 40MG SUBCUTANEOUSLY EVERY 2 WEEKS 2 each 3   hyoscyamine (LEVSIN SL) 0.125 MG SL tablet Use 1 tablet daily as needed for rectal spasms (Patient taking differently: as needed. Use 1 tablet daily as needed for rectal spasms) 30 tablet 0   ibuprofen (ADVIL,MOTRIN) 200 MG tablet Take 600 mg by mouth every 6 (six) hours as needed for moderate pain.      Multiple Vitamin (MULTIVITAMIN) tablet Take 1 tablet by mouth daily.     Current Facility-Administered Medications  Medication Dose Route Frequency Provider Last Rate Last Admin   0.9 %  sodium chloride infusion  500 mL Intravenous Once Mauri Pole, MD        Allergies as of 05/28/2022 - Review Complete 05/28/2022  Allergen Reaction Noted   Sulfa antibiotics Rash 09/16/2011    Family History  Problem Relation Age of Onset   Other Mother    Hypertension Mother    Colon polyps Mother    Irritable bowel syndrome Mother    Skin cancer Father    Melanoma Father    Colon polyps Brother    Psoriasis Brother    Dermatomyositis Brother    Breast cancer Paternal Aunt    Diabetes Neg Hx    Heart disease Neg Hx    Stroke Neg Hx    Stomach cancer Neg Hx    Colon cancer Neg Hx    Esophageal cancer Neg Hx    Pancreatic cancer Neg Hx    Crohn's disease Neg Hx    Rectal cancer Neg Hx     Social History   Socioeconomic History   Marital status: Single    Spouse  name: Not on file   Number of children: 0   Years of education: Not on file   Highest education level: Not on file  Occupational History   Occupation: managed care    Employer: [employer]  Tobacco Use   Smoking status: Never    Passive exposure: Never   Smokeless tobacco: Never  Vaping Use  Vaping Use: Never used  Substance and Sexual Activity   Alcohol use: Yes    Comment: occas   Drug use: No   Sexual activity: Not on file  Other Topics Concern   Not on file  Social History Narrative   Exercise; runs   Social Determinants of Health   Financial Resource Strain: Not on file  Food Insecurity: Not on file  Transportation Needs: Not on file  Physical Activity: Not on file  Stress: Not on file  Social Connections: Not on file  Intimate Partner Violence: Not on file    Review of Systems:  All other review of systems negative except as mentioned in the HPI.  Physical Exam: Vital signs in last 24 hours: Blood Pressure 126/86   Pulse 75   Temperature 98 F (36.7 C) (Temporal)   Height 5' 6"  (1.676 m)   Weight 156 lb 6.4 oz (70.9 kg)   Oxygen Saturation 98%   Body Mass Index 25.24 kg/m  General:   Alert, NAD Lungs:  Clear .   Heart:  Regular rate and rhythm Abdomen:  Soft, nontender and nondistended. Neuro/Psych:  Alert and cooperative. Normal mood and affect. A and O x 3  Reviewed labs, radiology imaging, old records and pertinent past GI work up  Patient is appropriate for planned procedure(s) and anesthesia in an ambulatory setting   K. Denzil Magnuson , MD [phone]

## 2022-05-28 NOTE — Progress Notes (Signed)
Called to room to assist during endoscopic procedure.  Patient ID and intended procedure confirmed with present staff. Received instructions for my participation in the procedure from the performing physician.  

## 2022-05-28 NOTE — Patient Instructions (Signed)
Please read handouts provided. Continue present medications. Await pathology results. Repeat colonoscopy in 5 years screening. Return to GI clinic at the next available appointment.   YOU HAD AN ENDOSCOPIC PROCEDURE TODAY AT [facility]:   Refer to the procedure report that was given to you for any specific questions about what was found during the examination.  If the procedure report does not answer your questions, please call your gastroenterologist to clarify.  If you requested that your care partner not be given the details of your procedure findings, then the procedure report has been included in a sealed envelope for you to review at your convenience later.  YOU SHOULD EXPECT: Some feelings of bloating in the abdomen. Passage of more gas than usual.  Walking can help get rid of the air that was put into your GI tract during the procedure and reduce the bloating. If you had a lower endoscopy (such as a colonoscopy or flexible sigmoidoscopy) you may notice spotting of blood in your stool or on the toilet paper. If you underwent a bowel prep for your procedure, you may not have a normal bowel movement for a few days.  Please Note:  You might notice some irritation and congestion in your nose or some drainage.  This is from the oxygen used during your procedure.  There is no need for concern and it should clear up in a day or so.  SYMPTOMS TO REPORT IMMEDIATELY:  Following lower endoscopy (colonoscopy or flexible sigmoidoscopy):  Excessive amounts of blood in the stool  Significant tenderness or worsening of abdominal pains  Swelling of the abdomen that is new, acute  Fever of 100F or higher.  For urgent or emergent issues, a gastroenterologist can be reached at any hour by calling [phone]. Do not use MyChart messaging for urgent concerns.    DIET:  We do recommend a small meal at first, but then you may proceed to your regular diet.  Drink plenty of fluids but  you should avoid alcoholic beverages for 24 hours.  ACTIVITY:  You should plan to take it easy for the rest of today and you should NOT DRIVE or use heavy machinery until tomorrow (because of the sedation medicines used during the test).    FOLLOW UP: Our staff will call the number listed on your records the next business day following your procedure.  We will call around 7:15- 8:00 am to check on you and address any questions or concerns that you may have regarding the information given to you following your procedure. If we do not reach you, we will leave a message.     If any biopsies were taken you will be contacted by phone or by letter within the next 1-3 weeks.  Please call us at [phone] if you have not heard about the biopsies in 3 weeks.    SIGNATURES/CONFIDENTIALITY: You and/or your care partner have signed paperwork which will be entered into your electronic medical record.  These signatures attest to the fact that that the information above on your After Visit Summary has been reviewed and is understood.  Full responsibility of the confidentiality of this discharge information lies with you and/or your care-partner.

## 2022-05-28 NOTE — Op Note (Signed)
[facility] Patient Name: [name] Procedure Date: 05/28/2022 8:18 AM MRN: [id number] Endoscopist: Mauri Pole , MD Age: 56 Referring MD:  Date of Birth: January 29, 1966 Gender: Female Account #: [id number] Procedure:                Colonoscopy Indications:              High risk colon cancer surveillance: Crohn's                            colitis of 8 (or more) years duration with                            one-third (or more) of the colon involved Medicines:                Monitored Anesthesia Care Procedure:                Pre-Anesthesia Assessment:                           - Prior to the procedure, a History and Physical                            was performed, and patient medications and                            allergies were reviewed. The patient's tolerance of                            previous anesthesia was also reviewed. The risks                            and benefits of the procedure and the sedation                            options and risks were discussed with the patient.                            All questions were answered, and informed consent                            was obtained. Prior Anticoagulants: The patient has                            taken no previous anticoagulant or antiplatelet                            agents. ASA Grade Assessment: II - A patient with                            mild systemic disease. After reviewing the risks                            and benefits, the patient was deemed in  satisfactory condition to undergo the procedure.                           After obtaining informed consent, the colonoscope                            was passed under direct vision. Throughout the                            procedure, the patient's blood pressure, pulse, and                            oxygen saturations were monitored continuously. The                            Olympus PCF-H190DL  ([id number]) Colonoscope was                            introduced through the anus and advanced to the the                            terminal ileum, with identification of the                            appendiceal orifice and IC valve. The colonoscopy                            was performed without difficulty. The patient                            tolerated the procedure well. The quality of the                            bowel preparation was excellent. The ileocecal                            valve, appendiceal orifice, and rectum were                            photographed. Scope In: 8:40:57 AM Scope Out: 9:01:36 AM Scope Withdrawal Time: 0 hours 12 minutes 27 seconds  Total Procedure Duration: 0 hours 20 minutes 39 seconds  Findings:                 The perianal and digital rectal examinations were                            normal.                           A 5 mm polyp was found in the transverse colon. The                            polyp was sessile. The polyp was removed with a  cold snare. Resection and retrieval were complete.                           Scattered small-mouthed diverticula were found in                            the sigmoid colon, descending colon and ascending                            colon.                           Non-bleeding external and internal hemorrhoids were                            found during retroflexion. The hemorrhoids were                            small.                           The Simple Endoscopic Score for Crohn's Disease was                            determined based on the endoscopic appearance of                            the mucosa in the following segments:                           - Ileum: Findings include no ulcers present, no                            ulcerated surfaces, no affected surfaces, no                            narrowings and no ulcers present, no ulcerated                             surfaces, no affected surfaces and no narrowings.                            Segment score: 0.                           - Right Colon: Findings include no ulcers present,                            no ulcerated surfaces, no affected surfaces and no                            narrowings. Segment score: 0.                           - Transverse Colon: Findings include no ulcers  present, no ulcerated surfaces, no affected                            surfaces and no narrowings. Segment score: 0.                           - Left Colon: Findings include no ulcers present,                            no ulcerated surfaces, no affected surfaces and no                            narrowings. Segment score: 0.                           - Rectum: Findings include no ulcers present, no                            ulcerated surfaces, no affected surfaces and no                            narrowings. Segment score: 0.                           - Total SES-CD aggregate score: 0. Complications:            No immediate complications. Estimated Blood Loss:     Estimated blood loss was minimal. Impression:               - One 5 mm polyp in the transverse colon, removed                            with a cold snare. Resected and retrieved.                           - Diverticulosis in the sigmoid colon, in the                            descending colon and in the ascending colon.                           - Non-bleeding external and internal hemorrhoids.                           - Simple Endoscopic Score for Crohn's Disease: 0,                            mucosal inflammatory changes secondary to Crohn's                            disease, in remission. Recommendation:           - Patient has a contact number available for  emergencies. The signs and symptoms of potential                            delayed complications were discussed with the                             patient. Return to normal activities tomorrow.                            Written discharge instructions were provided to the                            patient.                           - Resume previous diet.                           - Continue present medications.                           - Await pathology results.                           - Repeat colonoscopy in 5 years for surveillance.                           - Return to GI clinic at the next available                            appointment. Mauri Pole, MD 05/28/2022 9:07:51 AM This report has been signed electronically.

## 2022-05-29 ENCOUNTER — Telehealth: Payer: Self-pay | Admitting: *Deleted

## 2022-05-29 NOTE — Telephone Encounter (Signed)
Attempted to call patient for their post-procedure follow-up call. No answer. Left voicemail.   

## 2022-06-03 ENCOUNTER — Encounter: Payer: Self-pay | Admitting: Gastroenterology

## 2022-06-07 ENCOUNTER — Other Ambulatory Visit: Payer: Self-pay

## 2022-06-08 ENCOUNTER — Telehealth: Payer: 59 | Admitting: Family Medicine

## 2022-06-08 DIAGNOSIS — J069 Acute upper respiratory infection, unspecified: Secondary | ICD-10-CM | POA: Diagnosis not present

## 2022-06-08 MED ORDER — AMOXICILLIN 875 MG PO TABS: 875.0000 mg | ORAL_TABLET | Freq: Two times a day (BID) | ORAL | 0 refills | Status: AC

## 2022-06-08 NOTE — Progress Notes (Signed)
E-Visit for Sore Throat - Strep Symptoms  We are sorry that you are not feeling well.  Here is how we plan to help!  Based on what you have shared with me it is likely that you have strep pharyngitis.  Strep pharyngitis is inflammation and infection in the back of the throat.  This is an infection cause by bacteria and is treated with antibiotics.  I have prescribed Amoxicillin 875 mg BID for 10 days. . For throat pain, we recommend over the counter oral pain relief medications such as acetaminophen or aspirin, or anti-inflammatory medications such as ibuprofen or naproxen sodium. Topical treatments such as oral throat lozenges or sprays may be used as needed. Strep infections are not as easily transmitted as other respiratory infections, however we still recommend that you avoid close contact with loved ones, especially the very young and elderly.  Remember to wash your hands thoroughly throughout the day as this is the number one way to prevent the spread of infection and wipe down door knobs and counters with disinfectant.   Home Care: Only take medications as instructed by your medical team. Complete the entire course of an antibiotic. Do not take these medications with alcohol. A steam or ultrasonic humidifier can help congestion.  You can place a towel over your head and breathe in the steam from hot water coming from a faucet. Avoid close contacts especially the very young and the elderly. Cover your mouth when you cough or sneeze. Always remember to wash your hands.  Get Help Right Away If: You develop worsening fever or sinus pain. You develop a severe head ache or visual changes. Your symptoms persist after you have completed your treatment plan.  Make sure you Understand these instructions. Will watch your condition. Will get help right away if you are not doing well or get worse.   Thank you for choosing an e-visit.  Your e-visit answers were reviewed by a board certified  advanced clinical practitioner to complete your personal care plan. Depending upon the condition, your plan could have included both over the counter or prescription medications.  Please review your pharmacy choice. Make sure the pharmacy is open so you can pick up prescription now. If there is a problem, you may contact your provider through [employer] and have the prescription routed to another pharmacy.  Your safety is important to [place]. If you have drug allergies check your prescription carefully.   For the next 24 hours you can use MyChart to ask questions about today's visit, request a non-urgent call back, or ask for a work or school excuse. You will get an email in the next two days asking about your experience. I hope that your e-visit has been valuable and will speed your recovery.   I have provided 5 minutes of non face to face time during this encounter for chart review and documentation.

## 2022-06-09 ENCOUNTER — Telehealth: Payer: 59 | Admitting: Family

## 2022-06-09 DIAGNOSIS — H109 Unspecified conjunctivitis: Secondary | ICD-10-CM

## 2022-06-09 MED ORDER — POLYMYXIN B-TRIMETHOPRIM 10000-0.1 UNIT/ML-% OP SOLN: 1.0000 [drp] | Freq: Four times a day (QID) | OPHTHALMIC | 0 refills | Status: DC

## 2022-06-09 NOTE — Progress Notes (Signed)
E-Visit for [name]   We are sorry that you are not feeling well.  Here is how we plan to help!  Based on what you have shared with me it looks like you have conjunctivitis.  Conjunctivitis is a common inflammatory or infectious condition of the eye that is often referred to as "pink eye".  In most cases it is contagious (viral or bacterial). However, not all conjunctivitis requires antibiotics (ex. Allergic).  We have made appropriate suggestions for you based upon your presentation.  I have prescribed Polytrim Ophthalmic drops 1-2 drops 4 times a day times 5 days  Pink eye can be highly contagious.  It is typically spread through direct contact with secretions, or contaminated objects or surfaces that one may have touched.  Strict handwashing is suggested with soap and water is urged.  If not available, use alcohol based had sanitizer.  Avoid unnecessary touching of the eye.  If you wear contact lenses, you will need to refrain from wearing them until you see no white discharge from the eye for at least 24 hours after being on medication.  You should see symptom improvement in 1-2 days after starting the medication regimen.  Call us if symptoms are not improved in 1-2 days.  Home Care: Wash your hands often! Do not wear your contacts until you complete your treatment plan. Avoid sharing towels, bed linen, personal items with a person who has pink eye. See attention for anyone in your home with similar symptoms.  Get Help Right Away If: Your symptoms do not improve. You develop blurred or loss of vision. Your symptoms worsen (increased discharge, pain or redness)   Thank you for choosing an e-visit.  Your e-visit answers were reviewed by a board certified advanced clinical practitioner to complete your personal care plan. Depending upon the condition, your plan could have included both over the counter or prescription medications.  Please review your pharmacy choice. Make sure the  pharmacy is open so you can pick up prescription now. If there is a problem, you may contact your provider through [employer] and have the prescription routed to another pharmacy.  Your safety is important to [place]. If you have drug allergies check your prescription carefully.   For the next 24 hours you can use MyChart to ask questions about today's visit, request a non-urgent call back, or ask for a work or school excuse. You will get an email in the next two days asking about your experience. I hope that your e-visit has been valuable and will speed your recovery.   Approximately 5 minutes was spent documenting and reviewing patient's chart.

## 2022-06-19 NOTE — Progress Notes (Unsigned)
Subjective:    Patient ID: [name], female    DOB: April 15, 1966, 56 y.o.   MRN: [id number]      HPI [name] is here for No chief complaint on file.   VV 10/28 and 10/29 - URI ) amoxicillin 875), bacterial conjunctivitis.( Polytrim)          Medications and allergies reviewed with patient and updated if appropriate.  Current Outpatient Medications on File Prior to Visit  Medication Sig Dispense Refill   b complex vitamins capsule Take 1 capsule by mouth at bedtime.      Biotin 5000 MCG CAPS Take 5,000 mcg by mouth daily.     Calcium Carb-Cholecalciferol (CALCIUM + D3 PO) Take 1 tablet by mouth at bedtime.      cetirizine (ZYRTEC) 10 MG tablet Take 10 mg by mouth daily.     clotrimazole-betamethasone (LOTRISONE) cream Apply 1 application topically daily. (Patient taking differently: Apply 1 application  topically as needed.) 30 g 2   colestipol (COLESTID) 1 g tablet TAKE 1 TABLET BY MOUTH TWICE  DAILY 180 tablet 0   escitalopram (LEXAPRO) 10 MG tablet TAKE 1 TABLET BY MOUTH  DAILY 90 tablet 3   fluticasone (FLONASE) 50 MCG/ACT nasal spray Place 2 sprays into both nostrils daily. In each nostril as needed (Patient taking differently: Place 2 sprays into both nostrils daily as needed for allergies.) 16 g 2   HUMIRA PEN 40 MG/0.4ML PNKT INJECT 40MG SUBCUTANEOUSLY EVERY 2 WEEKS 2 each 3   hyoscyamine (LEVSIN SL) 0.125 MG SL tablet Use 1 tablet daily as needed for rectal spasms (Patient taking differently: as needed. Use 1 tablet daily as needed for rectal spasms) 30 tablet 0   ibuprofen (ADVIL,MOTRIN) 200 MG tablet Take 600 mg by mouth every 6 (six) hours as needed for moderate pain.      levothyroxine (SYNTHROID) 88 MCG tablet TAKE 1 TABLET BY MOUTH DAILY  BEFORE BREAKFAST 90 tablet 3   Multiple Vitamin (MULTIVITAMIN) tablet Take 1 tablet by mouth daily.     traZODone (DESYREL) 50 MG tablet TAKE 1/2 TO 1 TABLET BY MOUTH AT BEDTIME AS NEEDED FOR SLEEP 90 tablet 3    trimethoprim-polymyxin b (POLYTRIM) ophthalmic solution Place 1 drop into the left eye every 6 (six) hours. 10 mL 0   VITAMIN E PO Take 1 capsule by mouth daily.      No current facility-administered medications on file prior to visit.    Review of Systems     Objective:  There were no vitals filed for this visit. BP Readings from Last 3 Encounters:  05/28/22 132/68  04/01/22 130/72  02/22/22 (!) 133/91   Wt Readings from Last 3 Encounters:  05/28/22 156 lb 6.4 oz (70.9 kg)  04/30/22 156 lb 6.4 oz (70.9 kg)  04/01/22 157 lb (71.2 kg)   There is no height or weight on file to calculate BMI.    Physical Exam Constitutional:      General: She is not in acute distress.    Appearance: Normal appearance. She is not ill-appearing.  HENT:     Head: Normocephalic and atraumatic.     Right Ear: Tympanic membrane, ear canal and external ear normal.     Left Ear: Tympanic membrane, ear canal and external ear normal.     Mouth/Throat:     Mouth: Mucous membranes are moist.     Pharynx: No oropharyngeal exudate or posterior oropharyngeal erythema.  Eyes:     Conjunctiva/sclera: Conjunctivae  normal.  Cardiovascular:     Rate and Rhythm: Normal rate and regular rhythm.  Pulmonary:     Effort: Pulmonary effort is normal. No respiratory distress.     Breath sounds: Normal breath sounds. No wheezing or rales.  Musculoskeletal:     Cervical back: Neck supple. No tenderness.  Lymphadenopathy:     Cervical: No cervical adenopathy.  Skin:    General: Skin is warm and dry.  Neurological:     Mental Status: She is alert.            Assessment & Plan:    See Problem List for Assessment and Plan of chronic medical problems.

## 2022-06-20 ENCOUNTER — Encounter: Payer: Self-pay | Admitting: Internal Medicine

## 2022-06-20 ENCOUNTER — Ambulatory Visit: Payer: 59 | Admitting: Internal Medicine

## 2022-06-20 VITALS — BP 110/74 | HR 68 | Temp 98.0°F | Ht 66.0 in | Wt 161.4 lb

## 2022-06-20 DIAGNOSIS — B3731 Acute candidiasis of vulva and vagina: Secondary | ICD-10-CM | POA: Diagnosis not present

## 2022-06-20 DIAGNOSIS — H6992 Unspecified Eustachian tube disorder, left ear: Secondary | ICD-10-CM | POA: Diagnosis not present

## 2022-06-20 DIAGNOSIS — J069 Acute upper respiratory infection, unspecified: Secondary | ICD-10-CM | POA: Insufficient documentation

## 2022-06-20 MED ORDER — PREDNISONE 20 MG PO TABS: 40.0000 mg | ORAL_TABLET | Freq: Every day | ORAL | 0 refills | Status: AC

## 2022-06-20 MED ORDER — FLUCONAZOLE 150 MG PO TABS: 150.0000 mg | ORAL_TABLET | Freq: Once | ORAL | 0 refills | Status: AC

## 2022-06-20 NOTE — Assessment & Plan Note (Signed)
Acute Related to recent antibiotics We will treat with fluconazole 150 mg p.o. x1 We will also be starting steroids and most likely this could make the yeast infection worse so we will give her 2 pills 1 to take now and when to take after completing the steroids

## 2022-06-20 NOTE — Patient Instructions (Addendum)
    Medications changes include :   prednisone 40 mg daily x 5 days.  Continue flonase, saline nasal spray, fluconazole     Return if symptoms worsen or fail to improve.

## 2022-06-20 NOTE — Assessment & Plan Note (Signed)
Has been treated for this No need for additional antibiotics or other medications Continue symptomatic treatment

## 2022-06-20 NOTE — Assessment & Plan Note (Signed)
Acute Related to URI Has some associated hearing loss, clogged sensation and discomfort Continue Flonase Discussed that she can try Sudafed or we could try steroids She would like to do a course of steroids Prednisone 40 mg daily x5 days Continue saline nasal spray

## 2022-07-12 ENCOUNTER — Other Ambulatory Visit: Payer: Self-pay | Admitting: Gastroenterology

## 2022-07-26 ENCOUNTER — Other Ambulatory Visit: Payer: Self-pay | Admitting: Internal Medicine

## 2022-08-17 ENCOUNTER — Encounter: Payer: Self-pay | Admitting: Gastroenterology

## 2022-08-17 ENCOUNTER — Telehealth: Payer: 59 | Admitting: Nurse Practitioner

## 2022-08-17 ENCOUNTER — Telehealth: Payer: Self-pay | Admitting: Nurse Practitioner

## 2022-08-17 DIAGNOSIS — U071 COVID-19: Secondary | ICD-10-CM | POA: Diagnosis not present

## 2022-08-17 MED ORDER — NIRMATRELVIR/RITONAVIR (PAXLOVID)TABLET: 3.0000 | ORAL_TABLET | Freq: Two times a day (BID) | ORAL | 0 refills | Status: AC

## 2022-08-17 MED ORDER — PROMETHAZINE-DM 6.25-15 MG/5ML PO SYRP: 5.0000 mL | ORAL_SOLUTION | Freq: Four times a day (QID) | ORAL | 0 refills | Status: DC | PRN

## 2022-08-17 NOTE — Patient Instructions (Signed)
[name], thank you for joining Gildardo Pounds, NP for today's virtual visit.  While this provider is not your primary care provider (PCP), if your PCP is located in our provider database this encounter information will be shared with them immediately following your visit.   [facility] account gives you access to today's visit and all your visits, tests, and labs performed at [facility] " click here if you don't have a [facility] account or go to [email or web address]  Consent: (Patient) [name] provided verbal consent for this virtual visit at the beginning of the encounter.  Current Medications:  Current Outpatient Medications:    nirmatrelvir/ritonavir (PAXLOVID) 20 x 150 MG & 10 x '100MG'$  TABS, Take 3 tablets by mouth 2 (two) times daily for 5 days. (Take nirmatrelvir 150 mg two tablets twice daily for 5 days and ritonavir 100 mg one tablet twice daily for 5 days) Patient GFR is 99, Disp: 30 tablet, Rfl: 0   promethazine-dextromethorphan (PROMETHAZINE-DM) 6.25-15 MG/5ML syrup, Take 5 mLs by mouth 4 (four) times daily as needed for cough., Disp: 240 mL, Rfl: 0   b complex vitamins capsule, Take 1 capsule by mouth at bedtime. , Disp: , Rfl:    Biotin 5000 MCG CAPS, Take 5,000 mcg by mouth daily., Disp: , Rfl:    Calcium Carb-Cholecalciferol (CALCIUM + D3 PO), Take 1 tablet by mouth at bedtime. , Disp: , Rfl:    cetirizine (ZYRTEC) 10 MG tablet, Take 10 mg by mouth daily., Disp: , Rfl:    clotrimazole-betamethasone (LOTRISONE) cream, Apply 1 application topically daily. (Patient taking differently: Apply 1 application  topically as needed.), Disp: 30 g, Rfl: 2   colestipol (COLESTID) 1 g tablet, TAKE 1 TABLET BY MOUTH TWICE  DAILY, Disp: 180 tablet, Rfl: 3   escitalopram (LEXAPRO) 10 MG tablet, TAKE 1 TABLET BY MOUTH DAILY, Disp: 90 tablet, Rfl: 3   fluticasone (FLONASE) 50 MCG/ACT nasal spray, Place 2 sprays into both nostrils daily. In each  nostril as needed (Patient taking differently: Place 2 sprays into both nostrils daily as needed for allergies.), Disp: 16 g, Rfl: 2   HUMIRA PEN 40 MG/0.4ML PNKT, INJECT '40MG'$  SUBCUTANEOUSLY EVERY 2 WEEKS, Disp: 2 each, Rfl: 3   hyoscyamine (LEVSIN SL) 0.125 MG SL tablet, Use 1 tablet daily as needed for rectal spasms (Patient taking differently: as needed. Use 1 tablet daily as needed for rectal spasms), Disp: 30 tablet, Rfl: 0   ibuprofen (ADVIL,MOTRIN) 200 MG tablet, Take 600 mg by mouth every 6 (six) hours as needed for moderate pain. , Disp: , Rfl:    levothyroxine (SYNTHROID) 88 MCG tablet, TAKE 1 TABLET BY MOUTH DAILY  BEFORE BREAKFAST, Disp: 90 tablet, Rfl: 3   Multiple Vitamin (MULTIVITAMIN) tablet, Take 1 tablet by mouth daily., Disp: , Rfl:    nystatin (MYCOSTATIN) 100000 UNIT/ML suspension, Take by mouth., Disp: , Rfl:    traZODone (DESYREL) 50 MG tablet, TAKE 1/2 TO 1 TABLET BY MOUTH AT BEDTIME AS NEEDED FOR SLEEP, Disp: 90 tablet, Rfl: 3   trimethoprim-polymyxin b (POLYTRIM) ophthalmic solution, Place 1 drop into the left eye every 6 (six) hours., Disp: 10 mL, Rfl: 0   VITAMIN E PO, Take 1 capsule by mouth daily. , Disp: , Rfl:    Medications ordered in this encounter:  Meds ordered this encounter  Medications   nirmatrelvir/ritonavir (PAXLOVID) 20 x 150 MG & 10 x '100MG'$  TABS    Sig: Take 3 tablets by  mouth 2 (two) times daily for 5 days. (Take nirmatrelvir 150 mg two tablets twice daily for 5 days and ritonavir 100 mg one tablet twice daily for 5 days) Patient GFR is 99    Dispense:  30 tablet    Refill:  0    Order Specific Question:   Supervising Provider    Answer:   Chase Picket [[id number]]   promethazine-dextromethorphan (PROMETHAZINE-DM) 6.25-15 MG/5ML syrup    Sig: Take 5 mLs by mouth 4 (four) times daily as needed for cough.    Dispense:  240 mL    Refill:  0    Order Specific Question:   Supervising Provider    Answer:   Chase Picket [[id number]]     *If you  need refills on other medications prior to your next appointment, please contact your pharmacy*  Follow-Up: Call back or seek an in-person evaluation if the symptoms worsen or if the condition fails to improve as anticipated.  [facility] [phone]  Other Instructions  Please keep well-hydrated and get plenty of rest. Start a saline nasal rinse to flush out your nasal passages. You can use plain Mucinex to help thin congestion. If you have a humidifier, you can use this daily as needed.    You are to wear a mask for 5 days from onset of your symptoms.  After day 5, if you have had no fever and you are feeling better with NO symptoms, you can end masking. Keep in mind you can be contagious 10 days from the onset of symptoms  After day 5 if you have a fever or are having significant symptoms, please wear your mask for full 10 days.   If you note any worsening of symptoms, any significant shortness of breath or any chest pain, please seek ER evaluation ASAP.  Please do not delay care!    If you note any worsening of symptoms, any significant shortness of breath or any chest pain, please seek ER evaluation ASAP.  Please do not delay care!    If you have been instructed to have an in-person evaluation today at a local Urgent Care facility, please use the link below. It will take you to a list of all of our available [facility], including address, phone number and hours of operation. Please do not delay care.  [facility]  If you or a family member do not have a primary care provider, use the link below to schedule a visit and establish care. When you choose a [facility] primary care physician or advanced practice provider, you gain a long-term partner in health. Find a Primary Care Provider  Learn more about [facility] in-office and virtual care options: [facility]

## 2022-08-17 NOTE — Progress Notes (Signed)
You must schedule a video visit in order to get covid treatment. Please go to your my chart and instead of doing an evisit, select urgent care virtual visit. We cannot treat covid in evisit. [name] Hassell Done, FNP

## 2022-08-17 NOTE — Progress Notes (Signed)
Virtual Visit Consent   [name], you are scheduled for a virtual visit with a [facility] provider today. Just as with appointments in the office, your consent must be obtained to participate. Your consent will be active for this visit and any virtual visit you may have with one of our providers in the next 365 days. If you have a MyChart account, a copy of this consent can be sent to you electronically.  As this is a virtual visit, video technology does not allow for your provider to perform a traditional examination. This may limit your provider's ability to fully assess your condition. If your provider identifies any concerns that need to be evaluated in person or the need to arrange testing (such as labs, EKG, etc.), we will make arrangements to do so. Although advances in technology are sophisticated, we cannot ensure that it will always work on either your end or our end. If the connection with a video visit is poor, the visit may have to be switched to a telephone visit. With either a video or telephone visit, we are not always able to ensure that we have a secure connection.  By engaging in this virtual visit, you consent to the provision of healthcare and authorize for your insurance to be billed (if applicable) for the services provided during this visit. Depending on your insurance coverage, you may receive a charge related to this service.  I need to obtain your verbal consent now. Are you willing to proceed with your visit today? [name] has provided verbal consent on 08/17/2022 for a virtual visit (video or telephone). Gildardo Pounds, NP  Date: 08/17/2022 3:20 PM  Virtual Visit via Video Note   I, Gildardo Pounds, connected with  [name]  ([id number], 1966/02/23) on 08/17/22 at  3:15 PM EST by a video-enabled telemedicine application and verified that I am speaking with the correct person using two identifiers.  Location: Patient: Virtual Visit Location Patient:  Home Provider: Virtual Visit Location Provider: Office/Clinic   I discussed the limitations of evaluation and management by telemedicine and the availability of in person appointments. The patient expressed understanding and agreed to proceed.    History of Present Illness: [name] is a 57 y.o. who identifies as a female who was assigned female at birth, and is being seen today for [place].  Ms. Jakubowicz tested positive for COVID today with symptoms onset 2 days ago. She is currently experiencing the following symptoms:  diarrhea, Cough, congestion, fatigue, flushed.    Problems:  Patient Active Problem List   Diagnosis Date Noted   ETD (Eustachian tube dysfunction), left 06/20/2022   URI (upper respiratory infection) 06/20/2022   Vaginal yeast infection 06/20/2022   Ganglion cyst of foot 04/01/2022   Chronic fatigue 04/01/2022   Sleep difficulties 03/20/2021   High vitamin D level 03/19/2021   Rib pain on left side 06/01/2019   Prediabetes 03/14/2018   Cervicogenic headache 03/11/2018   History of breast cancer 01/24/2017   Overactive bladder 01/24/2017   PVC (premature ventricular contraction) 07/15/2016   Anxiety associated with depression 11/09/2013   Crohn's colitis ([place]) 11/09/2012   Osteopenia 10/29/2012   Hypothyroidism 04/22/2012   GOITER, MULTINODULAR 07/14/2007   INCONTINENCE, URGE 10/09/2006    Allergies:  Allergies  Allergen Reactions   Sulfa Antibiotics Rash   Medications:  Current Outpatient Medications:    nirmatrelvir/ritonavir (PAXLOVID) 20 x 150 MG & 10 x '100MG'$  TABS, Take 3 tablets  by mouth 2 (two) times daily for 5 days. (Take nirmatrelvir 150 mg two tablets twice daily for 5 days and ritonavir 100 mg one tablet twice daily for 5 days) Patient GFR is 99, Disp: 30 tablet, Rfl: 0   promethazine-dextromethorphan (PROMETHAZINE-DM) 6.25-15 MG/5ML syrup, Take 5 mLs by mouth 4 (four) times daily as needed for cough., Disp: 240 mL, Rfl: 0   b complex  vitamins capsule, Take 1 capsule by mouth at bedtime. , Disp: , Rfl:    Biotin 5000 MCG CAPS, Take 5,000 mcg by mouth daily., Disp: , Rfl:    Calcium Carb-Cholecalciferol (CALCIUM + D3 PO), Take 1 tablet by mouth at bedtime. , Disp: , Rfl:    cetirizine (ZYRTEC) 10 MG tablet, Take 10 mg by mouth daily., Disp: , Rfl:    clotrimazole-betamethasone (LOTRISONE) cream, Apply 1 application topically daily. (Patient taking differently: Apply 1 application  topically as needed.), Disp: 30 g, Rfl: 2   colestipol (COLESTID) 1 g tablet, TAKE 1 TABLET BY MOUTH TWICE  DAILY, Disp: 180 tablet, Rfl: 3   escitalopram (LEXAPRO) 10 MG tablet, TAKE 1 TABLET BY MOUTH DAILY, Disp: 90 tablet, Rfl: 3   fluticasone (FLONASE) 50 MCG/ACT nasal spray, Place 2 sprays into both nostrils daily. In each nostril as needed (Patient taking differently: Place 2 sprays into both nostrils daily as needed for allergies.), Disp: 16 g, Rfl: 2   HUMIRA PEN 40 MG/0.4ML PNKT, INJECT '40MG'$  SUBCUTANEOUSLY EVERY 2 WEEKS, Disp: 2 each, Rfl: 3   hyoscyamine (LEVSIN SL) 0.125 MG SL tablet, Use 1 tablet daily as needed for rectal spasms (Patient taking differently: as needed. Use 1 tablet daily as needed for rectal spasms), Disp: 30 tablet, Rfl: 0   ibuprofen (ADVIL,MOTRIN) 200 MG tablet, Take 600 mg by mouth every 6 (six) hours as needed for moderate pain. , Disp: , Rfl:    levothyroxine (SYNTHROID) 88 MCG tablet, TAKE 1 TABLET BY MOUTH DAILY  BEFORE BREAKFAST, Disp: 90 tablet, Rfl: 3   Multiple Vitamin (MULTIVITAMIN) tablet, Take 1 tablet by mouth daily., Disp: , Rfl:    nystatin (MYCOSTATIN) 100000 UNIT/ML suspension, Take by mouth., Disp: , Rfl:    traZODone (DESYREL) 50 MG tablet, TAKE 1/2 TO 1 TABLET BY MOUTH AT BEDTIME AS NEEDED FOR SLEEP, Disp: 90 tablet, Rfl: 3   trimethoprim-polymyxin b (POLYTRIM) ophthalmic solution, Place 1 drop into the left eye every 6 (six) hours., Disp: 10 mL, Rfl: 0   VITAMIN E PO, Take 1 capsule by mouth daily. ,  Disp: , Rfl:   Observations/Objective: Patient is well-developed, well-nourished in no acute distress.  Resting comfortably at home.  Head is normocephalic, atraumatic.  No labored breathing.  Speech is clear and coherent with logical content.  Patient is alert and oriented at baseline.    Assessment and Plan: 1. Positive self-administered antigen test for COVID-19 - nirmatrelvir/ritonavir (PAXLOVID) 20 x 150 MG & 10 x '100MG'$  TABS; Take 3 tablets by mouth 2 (two) times daily for 5 days. (Take nirmatrelvir 150 mg two tablets twice daily for 5 days and ritonavir 100 mg one tablet twice daily for 5 days) Patient GFR is 99  Dispense: 30 tablet; Refill: 0 - promethazine-dextromethorphan (PROMETHAZINE-DM) 6.25-15 MG/5ML syrup; Take 5 mLs by mouth 4 (four) times daily as needed for cough.  Dispense: 240 mL; Refill: 0   Please keep well-hydrated and get plenty of rest. Start a saline nasal rinse to flush out your nasal passages. You can use plain Mucinex to help thin  congestion. If you have a humidifier, you can use this daily as needed.    You are to wear a mask for 5 days from onset of your symptoms.  After day 5, if you have had no fever and you are feeling better with NO symptoms, you can end masking. Keep in mind you can be contagious 10 days from the onset of symptoms  After day 5 if you have a fever or are having significant symptoms, please wear your mask for full 10 days.   If you note any worsening of symptoms, any significant shortness of breath or any chest pain, please seek ER evaluation ASAP.  Please do not delay care!    If you note any worsening of symptoms, any significant shortness of breath or any chest pain, please seek ER evaluation ASAP.  Please do not delay care!   Follow Up Instructions: I discussed the assessment and treatment plan with the patient. The patient was provided an opportunity to ask questions and all were answered. The patient agreed with the plan and  demonstrated an understanding of the instructions.  A copy of instructions were sent to the patient via MyChart unless otherwise noted below.    The patient was advised to call back or seek an in-person evaluation if the symptoms worsen or if the condition fails to improve as anticipated.  Time:  I spent 11 minutes with the patient via telehealth technology discussing the above problems/concerns.    Gildardo Pounds, NP

## 2022-08-19 ENCOUNTER — Telehealth: Payer: Self-pay

## 2022-08-19 NOTE — Telephone Encounter (Signed)
Ok, thank you

## 2022-08-19 NOTE — Telephone Encounter (Signed)
Spoke with the patient. She has COVID. Rescheduling her appointment to at least 10 days out from onset of her symptoms which began about 08/15/22. The patient has Crohn's and is on Humira for treatment. In the last month she has begun experiencing increased flatulence and the passage of mucous stools. There has been some incontinence of the mucous stools. She feels that she is having "break through symptoms." I have her rescheduled to 08/27/22.

## 2022-08-20 ENCOUNTER — Ambulatory Visit: Payer: 59 | Admitting: Gastroenterology

## 2022-08-20 NOTE — Telephone Encounter (Signed)
Can you please try to bring her in sooner? Thanks

## 2022-08-27 ENCOUNTER — Other Ambulatory Visit (INDEPENDENT_AMBULATORY_CARE_PROVIDER_SITE_OTHER): Payer: 59

## 2022-08-27 ENCOUNTER — Encounter: Payer: Self-pay | Admitting: Gastroenterology

## 2022-08-27 ENCOUNTER — Ambulatory Visit (INDEPENDENT_AMBULATORY_CARE_PROVIDER_SITE_OTHER): Payer: 59 | Admitting: Gastroenterology

## 2022-08-27 VITALS — BP 92/70 | HR 72 | Ht 66.0 in | Wt 161.0 lb

## 2022-08-27 DIAGNOSIS — A09 Infectious gastroenteritis and colitis, unspecified: Secondary | ICD-10-CM | POA: Diagnosis not present

## 2022-08-27 DIAGNOSIS — R1013 Epigastric pain: Secondary | ICD-10-CM | POA: Diagnosis not present

## 2022-08-27 DIAGNOSIS — K625 Hemorrhage of anus and rectum: Secondary | ICD-10-CM

## 2022-08-27 DIAGNOSIS — K5080 Crohn's disease of both small and large intestine without complications: Secondary | ICD-10-CM | POA: Diagnosis not present

## 2022-08-27 LAB — CBC WITH DIFFERENTIAL/PLATELET
Basophils Absolute: 0 10*3/uL (ref 0.0–0.1)
Basophils Relative: 0.8 % (ref 0.0–3.0)
Eosinophils Absolute: 0.2 10*3/uL (ref 0.0–0.7)
Eosinophils Relative: 3.4 % (ref 0.0–5.0)
HCT: 39.5 % (ref 36.0–46.0)
Hemoglobin: 12.8 g/dL (ref 12.0–15.0)
Lymphocytes Relative: 28.3 % (ref 12.0–46.0)
Lymphs Abs: 1.5 10*3/uL (ref 0.7–4.0)
MCHC: 32.4 g/dL (ref 30.0–36.0)
MCV: 83.1 fl (ref 78.0–100.0)
Monocytes Absolute: 0.7 10*3/uL (ref 0.1–1.0)
Monocytes Relative: 13.7 % — ABNORMAL HIGH (ref 3.0–12.0)
Neutro Abs: 2.8 10*3/uL (ref 1.4–7.7)
Neutrophils Relative %: 53.8 % (ref 43.0–77.0)
Platelets: 236 10*3/uL (ref 150.0–400.0)
RBC: 4.75 Mil/uL (ref 3.87–5.11)
RDW: 12.9 % (ref 11.5–15.5)
WBC: 5.2 10*3/uL (ref 4.0–10.5)

## 2022-08-27 LAB — IBC + FERRITIN
Ferritin: 84 ng/mL (ref 10.0–291.0)
Iron: 58 ug/dL (ref 42–145)
Saturation Ratios: 21.2 % (ref 20.0–50.0)
TIBC: 273 ug/dL (ref 250.0–450.0)
Transferrin: 195 mg/dL — ABNORMAL LOW (ref 212.0–360.0)

## 2022-08-27 LAB — COMPREHENSIVE METABOLIC PANEL
ALT: 14 U/L (ref 0–35)
AST: 16 U/L (ref 0–37)
Albumin: 4.4 g/dL (ref 3.5–5.2)
Alkaline Phosphatase: 55 U/L (ref 39–117)
BUN: 15 mg/dL (ref 6–23)
CO2: 31 mEq/L (ref 19–32)
Calcium: 9.3 mg/dL (ref 8.4–10.5)
Chloride: 102 mEq/L (ref 96–112)
Creatinine, Ser: 0.65 mg/dL (ref 0.40–1.20)
GFR: 97.88 mL/min (ref >60.00)
Glucose, Bld: 98 mg/dL (ref 70–99)
Potassium: 3.8 mEq/L (ref 3.5–5.1)
Sodium: 139 mEq/L (ref 135–145)
Total Bilirubin: 0.3 mg/dL (ref 0.2–1.2)
Total Protein: 6.7 g/dL (ref 6.0–8.3)

## 2022-08-27 LAB — B12 AND FOLATE PANEL
Folate: >23.8 ng/mL (ref >5.9)
Vitamin B-12: 704 pg/mL (ref 211–911)

## 2022-08-27 LAB — SEDIMENTATION RATE: Sed Rate: 15 mm/hr (ref 0–30)

## 2022-08-27 LAB — HIGH SENSITIVITY CRP: CRP, High Sensitivity: 0.99 mg/L (ref 0.000–5.000)

## 2022-08-27 MED ORDER — DIFICID 200 MG PO TABS: 200.0000 mg | ORAL_TABLET | Freq: Two times a day (BID) | ORAL | 0 refills | Status: DC

## 2022-08-27 NOTE — Progress Notes (Signed)
[name]    [id number]    14-Oct-1965  Primary Care Physician:Burns, Claudina Lick, MD  Referring Physician: Binnie Rail, MD [address],  [place] [address]   Chief complaint:  Crohn's disease  HPI: 57 year old very pleasant female with h/o crohn's colitis initially diagnosed in 2013 , is here for follow up visit for Crohn's disease  She recently had COVID infection, has been having diarrhea for past few weeks.  She was off Humira for 2 weeks She was in clinical remission on Humira '40mg'$  every 2 weeks until this episode Intermittently for abdominal discomfort.  She also has noticed new rash on her underarms when she had COVID infection    Colonoscopy 05/28/22: - One 5 mm polyp in the transverse colon, removed with a cold snare. Resected and retrieved. - Diverticulosis in the sigmoid colon, in the descending colon and in the ascending colon. - Non-bleeding external and internal hemorrhoids. - Simple Endoscopic Score for Crohn's Disease: 0, mucosal inflammatory changes secondary to Crohn's  disease, in remission.   Colonoscopy 02/17/2017: Random biopsies from ileum and colon showed no active inflammation or dysplasia.    Bone density scan September 06, 2019: Showed osteopenia T score -1.9   Adalimumab drug trough and antibody level October 12, 2019: 8.3 [at therapeutic target] and undetectable respectively     Outpatient Encounter Medications as of 08/27/2022  Medication Sig   b complex vitamins capsule Take 1 capsule by mouth at bedtime.    Biotin 5000 MCG CAPS Take 5,000 mcg by mouth daily.   Calcium Carb-Cholecalciferol (CALCIUM + D3 PO) Take 1 tablet by mouth at bedtime.    cetirizine (ZYRTEC) 10 MG tablet Take 10 mg by mouth daily.   clotrimazole-betamethasone (LOTRISONE) cream Apply 1 application topically daily. (Patient taking differently: Apply 1 application  topically as needed.)   colestipol (COLESTID) 1 g tablet TAKE 1 TABLET BY MOUTH TWICE   DAILY   escitalopram (LEXAPRO) 10 MG tablet TAKE 1 TABLET BY MOUTH DAILY   fluticasone (FLONASE) 50 MCG/ACT nasal spray Place 2 sprays into both nostrils daily. In each nostril as needed (Patient taking differently: Place 2 sprays into both nostrils daily as needed for allergies.)   HUMIRA PEN 40 MG/0.4ML PNKT INJECT '40MG'$  SUBCUTANEOUSLY EVERY 2 WEEKS   hyoscyamine (LEVSIN SL) 0.125 MG SL tablet Use 1 tablet daily as needed for rectal spasms (Patient taking differently: as needed. Use 1 tablet daily as needed for rectal spasms)   ibuprofen (ADVIL,MOTRIN) 200 MG tablet Take 600 mg by mouth every 6 (six) hours as needed for moderate pain.    levothyroxine (SYNTHROID) 88 MCG tablet TAKE 1 TABLET BY MOUTH DAILY  BEFORE BREAKFAST   Multiple Vitamin (MULTIVITAMIN) tablet Take 1 tablet by mouth daily.   nystatin (MYCOSTATIN) 100000 UNIT/ML suspension Take by mouth.   promethazine-dextromethorphan (PROMETHAZINE-DM) 6.25-15 MG/5ML syrup Take 5 mLs by mouth 4 (four) times daily as needed for cough.   traZODone (DESYREL) 50 MG tablet TAKE 1/2 TO 1 TABLET BY MOUTH AT BEDTIME AS NEEDED FOR SLEEP   trimethoprim-polymyxin b (POLYTRIM) ophthalmic solution Place 1 drop into the left eye every 6 (six) hours.   VITAMIN E PO Take 1 capsule by mouth daily.    No facility-administered encounter medications on file as of 08/27/2022.    Allergies as of 08/27/2022 - Review Complete 08/17/2022  Allergen Reaction Noted   Sulfa antibiotics Rash 09/16/2011    Past Medical  History:  Diagnosis Date   Anxiety    Asthma    Cancer ([facility])    breast right x 2   Chronic headaches    Granulomatous colitis (HCC)    Heart murmur    History of bone density study    no osteopenia   History of MRI of cervical spine 11/2001   mild DJD   HX: breast cancer 11/98 and 12/03   recurrance   Hypothyroidism    IBS (irritable bowel syndrome)    Inguinal hernia    Personal history of chemotherapy    Personal history of radiation  therapy    Sleep apnea    "mild"    Past Surgical History:  Procedure Laterality Date   INGUINAL HERNIA REPAIR Right    MASTECTOMY Right 2010   TOTAL ABDOMINAL HYSTERECTOMY W/ BILATERAL SALPINGOOPHORECTOMY  2007   left cervix in place    Family History  Problem Relation Age of Onset   Other Mother    Hypertension Mother    Colon polyps Mother    Irritable bowel syndrome Mother    Skin cancer Father    Melanoma Father    Colon polyps Brother    Psoriasis Brother    Dermatomyositis Brother    Breast cancer Paternal Aunt    Diabetes Neg Hx    Heart disease Neg Hx    Stroke Neg Hx    Stomach cancer Neg Hx    Colon cancer Neg Hx    Esophageal cancer Neg Hx    Pancreatic cancer Neg Hx    Crohn's disease Neg Hx    Rectal cancer Neg Hx     Social History   Socioeconomic History   Marital status: Single    Spouse name: Not on file   Number of children: 0   Years of education: Not on file   Highest education level: Not on file  Occupational History   Occupation: managed care    Employer: [employer]  Tobacco Use   Smoking status: Never    Passive exposure: Never   Smokeless tobacco: Never  Vaping Use   Vaping Use: Never used  Substance and Sexual Activity   Alcohol use: Yes    Comment: occas   Drug use: No   Sexual activity: Not on file  Other Topics Concern   Not on file  Social History Narrative   Exercise; runs   Social Determinants of Health   Financial Resource Strain: Not on file  Food Insecurity: Not on file  Transportation Needs: Not on file  Physical Activity: Not on file  Stress: Not on file  Social Connections: Not on file  Intimate Partner Violence: Not on file      Review of systems: All other review of systems negative except as mentioned in the HPI.   Physical Exam: Vitals:   08/27/22 0941  BP: 92/70  Pulse: 72  SpO2: 98%   Body mass index is 25.99 kg/m. Gen:      No acute distress HEENT:  sclera anicteric Abd:      soft,  non-tender; no palpable masses, no distension Ext:    No edema Neuro: alert and oriented x 3 Psych: normal mood and affect  Data Reviewed:  Reviewed labs, radiology imaging, old records and pertinent past GI work up   Assessment and Plan/Recommendations:  57 year old very pleasant female female with history of Crohn's colitis initial diagnosis in 2013, was in clinical remission on Humira 40 mg every 2 weeks, missed  1-2 doses in the past few weeks during COVID infection  She is back on Humira, continue 40 mg every 2 weeks   Colonoscopy Oct 2023 with no active inflammation   Worsening diarrhea in past few weeks: Will check C. difficile to exclude C. difficile colitis exacerbating Crohn's flare Will empirically start on Dificid 200 mg twice daily for 10 days Follow-up CBC, CMP, CRP and sed rate   Epigastric pain: Check abdominal ultrasound   IBS with abdominal cramping: Use dicyclomine 10 mg up to 3 times daily as needed   IBD health maintenance: Follow-up iron panel, B12, folate, QuantiFERON TB Gold  This visit required 40 minutes of patient care (this includes precharting, chart review, review of results, face-to-face time used for counseling as well as treatment plan and follow-up. The patient was provided an opportunity to ask questions and all were answered. The patient agreed with the plan and demonstrated an understanding of the instructions.  Damaris Hippo , MD    CC: Binnie Rail, MD

## 2022-08-27 NOTE — Patient Instructions (Addendum)
Your provider has requested that you go to the basement level for lab work before leaving today. Press "B" on the elevator. The lab is located at the first door on the left as you exit the elevator.   You will be contacted by [facility] in the next 2 days to arrange a Abdominal ultrasound.  The number on your caller ID will be [phone], please answer when they call.  If you have not heard from them in 2 days please call [phone] to schedule.     We have sent the following medications to your pharmacy for you to pick up at your convenience:  Dificid  Due to recent changes in healthcare laws, you may see the results of your imaging and laboratory studies on MyChart before your provider has had a chance to review them.  We understand that in some cases there may be results that are confusing or concerning to you. Not all laboratory results come back in the same time frame and the provider may be waiting for multiple results in order to interpret others.  Please give [place] 48 hours in order for your provider to thoroughly review all the results before contacting the office for clarification of your results.    _______________________________________________________  If your blood pressure at your visit was 140/90 or greater, please contact your primary care physician to follow up on this.  _______________________________________________________  If you are age 58 or older, your body mass index should be between 23-30. Your Body mass index is 25.99 kg/m. If this is out of the aforementioned range listed, please consider follow up with your Primary Care Provider.  If you are age 58 or younger, your body mass index should be between 19-25. Your Body mass index is 25.99 kg/m. If this is out of the aformentioned range listed, please consider follow up with your Primary Care Provider.   ________________________________________________________  The [facility] providers would like  to encourage you to use [facility] to communicate with providers for non-urgent requests or questions.  Due to long hold times on the telephone, sending your provider a message by [facility] may be a faster and more efficient way to get a response.  Please allow 48 business hours for a response.  Please remember that this is for non-urgent requests.  _______________________________________________________   I appreciate the  opportunity to care for you  Thank You   Harl Bowie , MD

## 2022-08-28 ENCOUNTER — Encounter: Payer: Self-pay | Admitting: Internal Medicine

## 2022-08-28 ENCOUNTER — Other Ambulatory Visit: Payer: 59

## 2022-08-28 DIAGNOSIS — K5080 Crohn's disease of both small and large intestine without complications: Secondary | ICD-10-CM

## 2022-08-28 DIAGNOSIS — K625 Hemorrhage of anus and rectum: Secondary | ICD-10-CM

## 2022-08-28 DIAGNOSIS — A09 Infectious gastroenteritis and colitis, unspecified: Secondary | ICD-10-CM

## 2022-08-28 DIAGNOSIS — R1013 Epigastric pain: Secondary | ICD-10-CM

## 2022-08-29 ENCOUNTER — Other Ambulatory Visit: Payer: Self-pay | Admitting: Gastroenterology

## 2022-08-29 LAB — QUANTIFERON-TB GOLD PLUS
Mitogen-NIL: >10 IU/mL
NIL: 0.18 IU/mL
QuantiFERON-TB Gold Plus: NEGATIVE
TB1-NIL: 0.03 IU/mL
TB2-NIL: 0.03 IU/mL

## 2022-08-29 LAB — CLOSTRIDIUM DIFFICILE TOXIN B, QUALITATIVE, REAL-TIME PCR: Toxigenic C. Difficile by PCR: NOT DETECTED

## 2022-09-03 ENCOUNTER — Encounter: Payer: Self-pay | Admitting: Gastroenterology

## 2022-09-09 ENCOUNTER — Ambulatory Visit (HOSPITAL_COMMUNITY): Payer: 59

## 2022-09-10 NOTE — Progress Notes (Unsigned)
Subjective:    Patient ID: [name], female    DOB: Dec 24, 1965, 57 y.o.   MRN: [id number]     HPI [name] is here for follow up of her chronic medical problems, including anxiety, depression, sleep diff, hypothyroidism  Also traveling to [place] and needs immunizations update.   Doing well with current medications.    Hepatitis A  Typhoid oral   Medications and allergies reviewed with patient and updated if appropriate.  Current Outpatient Medications on File Prior to Visit  Medication Sig Dispense Refill   b complex vitamins capsule Take 1 capsule by mouth at bedtime.      Biotin 5000 MCG CAPS Take 5,000 mcg by mouth daily.     Calcium Carb-Cholecalciferol (CALCIUM + D3 PO) Take 1 tablet by mouth at bedtime.      cetirizine (ZYRTEC) 10 MG tablet Take 10 mg by mouth daily.     clotrimazole-betamethasone (LOTRISONE) cream Apply 1 application topically daily. (Patient taking differently: Apply 1 application  topically as needed.) 30 g 2   colestipol (COLESTID) 1 g tablet TAKE 1 TABLET BY MOUTH TWICE  DAILY 180 tablet 3   escitalopram (LEXAPRO) 10 MG tablet TAKE 1 TABLET BY MOUTH DAILY 90 tablet 3   fidaxomicin (DIFICID) 200 MG TABS tablet Take 1 tablet (200 mg total) by mouth 2 (two) times daily. 20 tablet 0   fluticasone (FLONASE) 50 MCG/ACT nasal spray Place 2 sprays into both nostrils daily. In each nostril as needed (Patient taking differently: Place 2 sprays into both nostrils daily as needed for allergies.) 16 g 2   HUMIRA, 2 PEN, 40 MG/0.4ML PNKT INJECT '40MG'$  SUBCUTANEOUSLY EVERY 2 WEEKS 2 each 3   hyoscyamine (LEVSIN SL) 0.125 MG SL tablet Use 1 tablet daily as needed for rectal spasms (Patient taking differently: as needed. Use 1 tablet daily as needed for rectal spasms) 30 tablet 0   ibuprofen (ADVIL,MOTRIN) 200 MG tablet Take 600 mg by mouth every 6 (six) hours as needed for moderate pain.      levothyroxine (SYNTHROID) 88 MCG tablet TAKE 1 TABLET BY MOUTH DAILY   BEFORE BREAKFAST 90 tablet 3   Multiple Vitamin (MULTIVITAMIN) tablet Take 1 tablet by mouth daily.     promethazine-dextromethorphan (PROMETHAZINE-DM) 6.25-15 MG/5ML syrup Take 5 mLs by mouth 4 (four) times daily as needed for cough. 240 mL 0   traZODone (DESYREL) 50 MG tablet TAKE 1/2 TO 1 TABLET BY MOUTH AT BEDTIME AS NEEDED FOR SLEEP 90 tablet 3   VITAMIN E PO Take 1 capsule by mouth daily.      No current facility-administered medications on file prior to visit.     Review of Systems     Objective:   Vitals:   09/11/22 1455  BP: 102/70  Pulse: 75  Temp: 98 F (36.7 C)  SpO2: 97%   BP Readings from Last 3 Encounters:  09/11/22 102/70  08/27/22 92/70  06/20/22 110/74   Wt Readings from Last 3 Encounters:  09/11/22 162 lb (73.5 kg)  08/27/22 161 lb (73 kg)  06/20/22 161 lb 6.4 oz (73.2 kg)   Body mass index is 26.15 kg/m.    Physical Exam Constitutional:      General: She is not in acute distress.    Appearance: Normal appearance. She is not ill-appearing.  HENT:     Head: Normocephalic and atraumatic.  Skin:    General: Skin is warm and dry.  Neurological:  Mental Status: She is alert.  Psychiatric:        Mood and Affect: Mood normal.        Lab Results  Component Value Date   WBC 5.2 08/27/2022   HGB 12.8 08/27/2022   HCT 39.5 08/27/2022   PLT 236.0 08/27/2022   GLUCOSE 98 08/27/2022   CHOL 199 04/01/2022   TRIG 100.0 04/01/2022   HDL 58.20 04/01/2022   LDLCALC 121 (H) 04/01/2022   ALT 14 08/27/2022   AST 16 08/27/2022   NA 139 08/27/2022   K 3.8 08/27/2022   CL 102 08/27/2022   CREATININE 0.65 08/27/2022   BUN 15 08/27/2022   CO2 31 08/27/2022   TSH 1.21 04/01/2022   HGBA1C 6.2 04/01/2022     Assessment & Plan:    See Problem List for Assessment and Plan of chronic medical problems.

## 2022-09-11 ENCOUNTER — Ambulatory Visit: Payer: 59 | Admitting: Internal Medicine

## 2022-09-11 ENCOUNTER — Encounter: Payer: Self-pay | Admitting: Internal Medicine

## 2022-09-11 ENCOUNTER — Encounter: Payer: Self-pay | Admitting: Gastroenterology

## 2022-09-11 VITALS — BP 102/70 | HR 75 | Temp 98.0°F | Ht 66.0 in | Wt 162.0 lb

## 2022-09-11 DIAGNOSIS — E039 Hypothyroidism, unspecified: Secondary | ICD-10-CM | POA: Diagnosis not present

## 2022-09-11 DIAGNOSIS — F418 Other specified anxiety disorders: Secondary | ICD-10-CM

## 2022-09-11 DIAGNOSIS — G479 Sleep disorder, unspecified: Secondary | ICD-10-CM

## 2022-09-11 DIAGNOSIS — Z23 Encounter for immunization: Secondary | ICD-10-CM | POA: Diagnosis not present

## 2022-09-11 DIAGNOSIS — Z7184 Encounter for health counseling related to travel: Secondary | ICD-10-CM | POA: Diagnosis not present

## 2022-09-11 MED ORDER — ATOVAQUONE-PROGUANIL HCL 250-100 MG PO TABS: 1.0000 | ORAL_TABLET | Freq: Every day | ORAL | 0 refills | Status: DC

## 2022-09-11 NOTE — Assessment & Plan Note (Signed)
Going to [place] in April  Reviewed immunizations needed Hepatitis A vaccine given today - will return in 6 months for #2 Malarone rx given Will ask GI if she can have the oral typhoid vaccine - looks CI with humira Other immunization up to date or not needed

## 2022-09-11 NOTE — Patient Instructions (Addendum)
    Hepatitis A vaccine given today.   Shingles vaccine #1 given today.    Medications changes include :    Malaria prophylaxis    Ask Dr Silverio Decamp about if you can take the oral Typhoid vaccine?  Can you do the injection vaccine if not?

## 2022-09-11 NOTE — Assessment & Plan Note (Signed)
Chronic  Last tsh at a good level Currently taking levothyroxine  88 mcg daily

## 2022-09-11 NOTE — Assessment & Plan Note (Signed)
Chronic Controlled, stable Continue lexapro 10 mg daily  

## 2022-09-11 NOTE — Assessment & Plan Note (Signed)
Chronic Controlled, stable Continue trazodone 50-100 mg daily

## 2022-09-19 ENCOUNTER — Encounter: Payer: Self-pay | Admitting: Gastroenterology

## 2022-09-26 ENCOUNTER — Encounter: Payer: Self-pay | Admitting: Internal Medicine

## 2022-09-26 NOTE — Telephone Encounter (Signed)
She needs to be careful with any live vaccines and do not recommend them with immunosuppressive therapy that she is on, is fine to take any of the inactive vaccines that is recommended to prevent risk of infection.

## 2022-12-15 ENCOUNTER — Other Ambulatory Visit: Payer: Self-pay | Admitting: Internal Medicine

## 2023-01-03 ENCOUNTER — Other Ambulatory Visit: Payer: Self-pay | Admitting: Gastroenterology

## 2023-02-24 ENCOUNTER — Inpatient Hospital Stay: Payer: 59 | Attending: Oncology | Admitting: Oncology

## 2023-02-24 VITALS — BP 136/90 | HR 66 | Temp 97.6°F | Resp 16 | Wt 158.1 lb

## 2023-02-24 DIAGNOSIS — Z9011 Acquired absence of right breast and nipple: Secondary | ICD-10-CM | POA: Diagnosis not present

## 2023-02-24 DIAGNOSIS — Z853 Personal history of malignant neoplasm of breast: Secondary | ICD-10-CM

## 2023-02-24 NOTE — Progress Notes (Signed)
  [facility] OFFICE PROGRESS NOTE   Diagnosis: Breast cancer  INTERVAL HISTORY:   [name] returns as scheduled.  She feels well.  No change at the right chest wall or left breast.  She has intermittent episodes of diarrhea.  She continues Humira and Colestid for treatment of inflammatory bowel disease. A left mammogram 03/28/2022 revealed no evidence of malignancy.  Objective:  Vital signs in last 24 hours:  Blood pressure (!) 136/90, pulse 66, temperature 97.6 F (36.4 C), temperature source Temporal, resp. rate 16, weight 158 lb 1.6 oz (71.7 kg), SpO2 96%.     Lymphatics: No cervical, supraclavicular, or axillary nodes Resp: Lungs clear bilaterally Cardio: Regular rate and rhythm GI: No hepatosplenomegaly Vascular: No right arm arm or leg edema Breast: Right mastectomy with TRAM reconstruction.  No evidence for chest wall tumor recurrence.  Left breast without mass.     Lab Results:  Lab Results  Component Value Date   WBC 5.2 08/27/2022   HGB 12.8 08/27/2022   HCT 39.5 08/27/2022   MCV 83.1 08/27/2022   PLT 236.0 08/27/2022   NEUTROABS 2.8 08/27/2022    CMP  Lab Results  Component Value Date   NA 139 08/27/2022   K 3.8 08/27/2022   CL 102 08/27/2022   CO2 31 08/27/2022   GLUCOSE 98 08/27/2022   BUN 15 08/27/2022   CREATININE 0.65 08/27/2022   CALCIUM 9.3 08/27/2022   PROT 6.7 08/27/2022   ALBUMIN 4.4 08/27/2022   AST 16 08/27/2022   ALT 14 08/27/2022   ALKPHOS 55 08/27/2022   BILITOT 0.3 08/27/2022   GFRNONAA 59 (L) 07/28/2021   GFRAA >60 03/30/2011    Medications: I have reviewed the patient's current medications.   Assessment/Plan: [name]. [name] was diagnosed with right-sided breast cancer in November 1998. She developed a right breast recurrence in December 2003 and underwent a right mastectomy/TRAM reconstruction. She completed adjuvant AC and Taxol chemotherapy. She took tamoxifen August 2004 through July 2009. She began Femara  04/12/2008, discontinued at the end of 2014.   She underwent an ultrasound-guided biopsy of a suspicious lesion at the 9 o'clock position of the left breast in March 2023.  The pathology revealed benign fibrocystic change.   Disposition: [name]. [name] will be scheduled for a left mammogram next month.  She would like to continue follow-up at the Cancer center.  She will return for an office visit in 1 year.  Thornton Papas, MD  02/24/2023  12:53 PM

## 2023-04-10 ENCOUNTER — Other Ambulatory Visit: Payer: Self-pay | Admitting: Internal Medicine

## 2023-04-17 ENCOUNTER — Ambulatory Visit: Payer: 59

## 2023-04-27 ENCOUNTER — Other Ambulatory Visit: Payer: Self-pay | Admitting: Gastroenterology

## 2023-05-01 ENCOUNTER — Ambulatory Visit (INDEPENDENT_AMBULATORY_CARE_PROVIDER_SITE_OTHER): Payer: 59 | Admitting: Radiology

## 2023-05-01 DIAGNOSIS — Z23 Encounter for immunization: Secondary | ICD-10-CM | POA: Diagnosis not present

## 2023-05-01 NOTE — Progress Notes (Signed)
Patient here for 2nd shingle vaccine, patient tolerate well with no complications.

## 2023-06-04 ENCOUNTER — Other Ambulatory Visit: Payer: Self-pay | Admitting: Gastroenterology

## 2023-06-10 ENCOUNTER — Telehealth: Payer: Self-pay | Admitting: Gastroenterology

## 2023-06-10 NOTE — Telephone Encounter (Signed)
It is fine to have her take antibiotics if indicated for her dental procedure.  Please advise patient to take probiotics for 2-4 weeks after completion of the the antibiotics

## 2023-06-10 NOTE — Telephone Encounter (Signed)
Inbound call from patient's dentist stating she needs to prescribe patient antibiotics. States she is concerned patient will have a Crohn's flare up. Wishing to consult further. Phone number to call back on is [phone] and ask for Dr. Marshell Garfinkel. Please advise, thank you.

## 2023-06-10 NOTE — Telephone Encounter (Signed)
Patient and Dr Marshell Garfinkel notified.

## 2023-06-10 NOTE — Telephone Encounter (Signed)
Dr Marshell Garfinkel contacted. She asks if the patient can safely take Amoxicillin 500 mg twice daily for 7 days along with prednisone.

## 2023-06-18 ENCOUNTER — Other Ambulatory Visit: Payer: Self-pay | Admitting: Internal Medicine

## 2023-06-29 ENCOUNTER — Emergency Department (HOSPITAL_COMMUNITY)
Admission: EM | Admit: 2023-06-29 | Discharge: 2023-06-29 | Discharge status: Against Medical Advice | Payer: 59 | Attending: Emergency Medicine | Admitting: Emergency Medicine

## 2023-06-29 ENCOUNTER — Encounter (HOSPITAL_COMMUNITY): Payer: Self-pay

## 2023-06-29 ENCOUNTER — Other Ambulatory Visit: Payer: Self-pay

## 2023-06-29 DIAGNOSIS — R1030 Lower abdominal pain, unspecified: Secondary | ICD-10-CM | POA: Insufficient documentation

## 2023-06-29 DIAGNOSIS — R112 Nausea with vomiting, unspecified: Secondary | ICD-10-CM | POA: Insufficient documentation

## 2023-06-29 DIAGNOSIS — Z5321 Procedure and treatment not carried out due to patient leaving prior to being seen by health care provider: Secondary | ICD-10-CM | POA: Insufficient documentation

## 2023-06-29 NOTE — ED Notes (Signed)
Pt is feeling miraculously better - she is thinking of leaving d/t feeling better.

## 2023-06-29 NOTE — ED Triage Notes (Signed)
Pt BIB [facility] d/t lower ABD pain with N/V (bile and clear) and decreased UOP.  Onset 1000

## 2023-07-02 ENCOUNTER — Encounter: Payer: Self-pay | Admitting: Internal Medicine

## 2023-07-02 NOTE — Progress Notes (Signed)
    Subjective:    Patient ID: [name], female    DOB: 03-27-1966, 57 y.o.   MRN: [id number]      HPI Tayia is here for No chief complaint on file.   11/16 - passed a kidney stone while going to the ED - did not stay for evaluation because she thought the pain was getting better.  Since then the pain has gotten worse.  She has back pain, nausea and bladder urgency.       Medications and allergies reviewed with patient and updated if appropriate.  Current Outpatient Medications on File Prior to Visit  Medication Sig Dispense Refill   b complex vitamins capsule Take 1 capsule by mouth at bedtime.      Biotin 5000 MCG CAPS Take 5,000 mcg by mouth daily.     Calcium Carb-Cholecalciferol (CALCIUM + D3 PO) Take 1 tablet by mouth at bedtime.      cetirizine (ZYRTEC) 10 MG tablet Take 10 mg by mouth daily.     clotrimazole-betamethasone (LOTRISONE) cream Apply 1 application topically daily. (Patient taking differently: Apply 1 application  topically as needed.) 30 g 2   colestipol (COLESTID) 1 g tablet TAKE 1 TABLET BY MOUTH TWICE  DAILY 180 tablet 3   escitalopram (LEXAPRO) 10 MG tablet TAKE 1 TABLET BY MOUTH DAILY 30 tablet 0   fluticasone (FLONASE) 50 MCG/ACT nasal spray Place 2 sprays into both nostrils daily. In each nostril as needed (Patient taking differently: Place 2 sprays into both nostrils daily as needed for allergies.) 16 g 2   HUMIRA, 2 PEN, 40 MG/0.4ML pen INJECT 1 PEN SUBCUTANEOUSLY  EVERY OTHER WEEK 2 each 3   hyoscyamine (LEVSIN SL) 0.125 MG SL tablet Use 1 tablet daily as needed for rectal spasms (Patient taking differently: as needed. Use 1 tablet daily as needed for rectal spasms) 30 tablet 0   ibuprofen (ADVIL,MOTRIN) 200 MG tablet Take 600 mg by mouth every 6 (six) hours as needed for moderate pain.      levothyroxine (SYNTHROID) 88 MCG tablet TAKE 1 TABLET BY MOUTH DAILY  BEFORE BREAKFAST 90 tablet 0   Multiple Vitamin (MULTIVITAMIN) tablet Take 1 tablet by  mouth daily.     Probiotic Product (PROBIOTIC BLEND PO) Take 1 tablet by mouth daily. Align     promethazine-dextromethorphan (PROMETHAZINE-DM) 6.25-15 MG/5ML syrup Take 5 mLs by mouth 4 (four) times daily as needed for cough. 240 mL 0   traZODone (DESYREL) 50 MG tablet TAKE 1/2 TO 1 TABLET BY MOUTH AT BEDTIME AS NEEDED FOR SLEEP 90 tablet 3   VITAMIN E PO Take 1 capsule by mouth daily.      No current facility-administered medications on file prior to visit.    Review of Systems     Objective:  There were no vitals filed for this visit. BP Readings from Last 3 Encounters:  06/29/23 (!) 148/84  02/24/23 (!) 136/90  09/11/22 102/70   Wt Readings from Last 3 Encounters:  06/29/23 158 lb 1.1 oz (71.7 kg)  02/24/23 158 lb 1.6 oz (71.7 kg)  09/11/22 162 lb (73.5 kg)   There is no height or weight on file to calculate BMI.    Physical Exam         Assessment & Plan:    See Problem List for Assessment and Plan of chronic medical problems.

## 2023-07-02 NOTE — Patient Instructions (Incomplete)
         Medications changes include :   flomax, vicodin for pain  and zofran for nausea    A Ct scan of your kidneys was ordered and someone will call you to schedule an appointment.     Return if symptoms worsen or fail to improve.

## 2023-07-03 ENCOUNTER — Ambulatory Visit
Admission: RE | Admit: 2023-07-03 | Discharge: 2023-07-03 | Disposition: A | Discharge status: Home / Self Care | Payer: 59 | Source: Ambulatory Visit | Attending: Internal Medicine | Admitting: Internal Medicine

## 2023-07-03 ENCOUNTER — Ambulatory Visit (INDEPENDENT_AMBULATORY_CARE_PROVIDER_SITE_OTHER): Payer: 59 | Admitting: Internal Medicine

## 2023-07-03 ENCOUNTER — Encounter: Payer: Self-pay | Admitting: Internal Medicine

## 2023-07-03 VITALS — BP 114/76 | HR 64 | Temp 98.1°F | Ht 66.0 in | Wt 158.0 lb

## 2023-07-03 DIAGNOSIS — R3129 Other microscopic hematuria: Secondary | ICD-10-CM | POA: Diagnosis not present

## 2023-07-03 DIAGNOSIS — R109 Unspecified abdominal pain: Secondary | ICD-10-CM | POA: Diagnosis not present

## 2023-07-03 LAB — POC URINALSYSI DIPSTICK (AUTOMATED)
Bilirubin, UA: NEGATIVE
Glucose, UA: NEGATIVE
Ketones, UA: NEGATIVE
Leukocytes, UA: NEGATIVE
Nitrite, UA: NEGATIVE
Protein, UA: NEGATIVE
Spec Grav, UA: 1.015 (ref 1.010–1.025)
Urobilinogen, UA: 0.2 U/dL
pH, UA: 6 (ref 5.0–8.0)

## 2023-07-03 MED ORDER — HYDROCODONE-ACETAMINOPHEN 5-325 MG PO TABS: 1.0000 | ORAL_TABLET | Freq: Four times a day (QID) | ORAL | 0 refills | Status: AC | PRN

## 2023-07-03 MED ORDER — ONDANSETRON 4 MG PO TBDP: 4.0000 mg | ORAL_TABLET | Freq: Three times a day (TID) | ORAL | 0 refills | Status: AC | PRN

## 2023-07-03 MED ORDER — TAMSULOSIN HCL 0.4 MG PO CAPS: 0.4000 mg | ORAL_CAPSULE | Freq: Every day | ORAL | 0 refills | Status: DC

## 2023-07-03 NOTE — Assessment & Plan Note (Signed)
Acute UA today shows microscopic hematuria without evidence of infection Given increased risk of infection that could be just beginning will send for culture Likely related to presumed kidney stones

## 2023-07-03 NOTE — Assessment & Plan Note (Signed)
Acute Experiencing right lower quadrant pain, lower back pain that varies in intensity-a couple of times it has been severe Has had nausea/vomiting and bladder feels like she is getting get an infection-irritation History of kidney stones CT renal stat to evaluate for kidney stones and hydronephrosis Flomax 0.4 mg daily Vicodin 5-325 mg take 1-2 tabs every 6 hours as needed for severe pain-Tylenol or ibuprofen for less severe pain Zofran 0.4 mg every 8 hours as needed nausea Call if symptoms worsen

## 2023-07-03 NOTE — Addendum Note (Signed)
Addended by: Karma Ganja on: 07/03/2023 04:35 PM   Modules accepted: Orders

## 2023-07-05 LAB — CULTURE, URINE COMPREHENSIVE: RESULT:: NO GROWTH

## 2023-07-07 ENCOUNTER — Ambulatory Visit: Payer: 59

## 2023-07-07 ENCOUNTER — Ambulatory Visit
Admission: RE | Admit: 2023-07-07 | Discharge: 2023-07-07 | Disposition: A | Discharge status: Home / Self Care | Payer: 59 | Source: Ambulatory Visit | Attending: Oncology | Admitting: Oncology

## 2023-07-07 DIAGNOSIS — Z853 Personal history of malignant neoplasm of breast: Secondary | ICD-10-CM

## 2023-07-14 ENCOUNTER — Other Ambulatory Visit: Payer: Self-pay | Admitting: Internal Medicine

## 2023-07-20 ENCOUNTER — Telehealth: Payer: 59 | Admitting: Family Medicine

## 2023-07-20 DIAGNOSIS — B9689 Other specified bacterial agents as the cause of diseases classified elsewhere: Secondary | ICD-10-CM | POA: Diagnosis not present

## 2023-07-20 DIAGNOSIS — R062 Wheezing: Secondary | ICD-10-CM | POA: Diagnosis not present

## 2023-07-20 DIAGNOSIS — J019 Acute sinusitis, unspecified: Secondary | ICD-10-CM

## 2023-07-20 MED ORDER — DOXYCYCLINE HYCLATE 100 MG PO TABS: 100.0000 mg | ORAL_TABLET | Freq: Two times a day (BID) | ORAL | 0 refills | Status: AC

## 2023-07-20 MED ORDER — PREDNISONE 20 MG PO TABS: 20.0000 mg | ORAL_TABLET | Freq: Two times a day (BID) | ORAL | 0 refills | Status: AC

## 2023-07-20 MED ORDER — ALBUTEROL SULFATE HFA 108 (90 BASE) MCG/ACT IN AERS: 2.0000 | INHALATION_SPRAY | Freq: Four times a day (QID) | RESPIRATORY_TRACT | 0 refills | Status: AC | PRN

## 2023-07-20 NOTE — Progress Notes (Signed)
Virtual Visit Consent   KENT STULLER, you are scheduled for a virtual visit with a [facility] provider today. Just as with appointments in the office, your consent must be obtained to participate. Your consent will be active for this visit and any virtual visit you may have with one of our providers in the next 365 days. If you have a MyChart account, a copy of this consent can be sent to you electronically.  As this is a virtual visit, video technology does not allow for your provider to perform a traditional examination. This may limit your provider's ability to fully assess your condition. If your provider identifies any concerns that need to be evaluated in person or the need to arrange testing (such as labs, EKG, etc.), we will make arrangements to do so. Although advances in technology are sophisticated, we cannot ensure that it will always work on either your end or our end. If the connection with a video visit is poor, the visit may have to be switched to a telephone visit. With either a video or telephone visit, we are not always able to ensure that we have a secure connection.  By engaging in this virtual visit, you consent to the provision of healthcare and authorize for your insurance to be billed (if applicable) for the services provided during this visit. Depending on your insurance coverage, you may receive a charge related to this service.  I need to obtain your verbal consent now. Are you willing to proceed with your visit today? [name] has provided verbal consent on 07/20/2023 for a virtual visit (video or telephone). Georgana Curio, FNP  Date: 07/20/2023 4:04 PM  Virtual Visit via Video Note   I, Georgana Curio, connected with  [name]  ([id number], [birth year]) on 07/20/23 at  4:00 PM EST by a video-enabled telemedicine application and verified that I am speaking with the correct person using two identifiers.  Location: Patient: Virtual Visit Location Patient:  Home Provider: Virtual Visit Location Provider: Home Office   I discussed the limitations of evaluation and management by telemedicine and the availability of in person appointments. The patient expressed understanding and agreed to proceed.    History of Present Illness: [name] is a 57 y.o. who identifies as a female who was assigned female at birth, and is being seen today for sinus pressure and pain, post nasal drainage, no fever, has cough due to drainage. She is wheezing at times. Sx for a week worsening. Marland Kitchen  HPI: HPI  Problems:  Patient Active Problem List   Diagnosis Date Noted   Flank pain 07/03/2023   Microscopic hematuria 07/03/2023   Travel advice encounter 09/11/2022   ETD (Eustachian tube dysfunction), left 06/20/2022   Vaginal yeast infection 06/20/2022   Ganglion cyst of foot 04/01/2022   Chronic fatigue 04/01/2022   Sleep difficulties 03/20/2021   High vitamin D level 03/19/2021   Rib pain on left side 06/01/2019   Prediabetes 03/14/2018   Cervicogenic headache 03/11/2018   History of breast cancer 01/24/2017   Overactive bladder 01/24/2017   PVC (premature ventricular contraction) 07/15/2016   Anxiety associated with depression 11/09/2013   Crohn's colitis (HCC) 11/09/2012   Osteopenia 10/29/2012   Hypothyroidism 04/22/2012   GOITER, MULTINODULAR 07/14/2007   INCONTINENCE, URGE 10/09/2006    Allergies:  Allergies  Allergen Reactions   Sulfa Antibiotics Rash   Medications:  Current Outpatient Medications:    albuterol (VENTOLIN HFA) 108 (90 Base) MCG/ACT inhaler, Inhale  2 puffs into the lungs every 6 (six) hours as needed for wheezing or shortness of breath., Disp: 8 g, Rfl: 0   doxycycline (VIBRA-TABS) 100 MG tablet, Take 1 tablet (100 mg total) by mouth 2 (two) times daily for 10 days., Disp: 20 tablet, Rfl: 0   predniSONE (DELTASONE) 20 MG tablet, Take 1 tablet (20 mg total) by mouth 2 (two) times daily with a meal for 5 days., Disp: 10 tablet,  Rfl: 0   b complex vitamins capsule, Take 1 capsule by mouth at bedtime. , Disp: , Rfl:    Biotin 5000 MCG CAPS, Take 5,000 mcg by mouth daily., Disp: , Rfl:    Calcium Carb-Cholecalciferol (CALCIUM + D3 PO), Take 1 tablet by mouth at bedtime. , Disp: , Rfl:    cetirizine (ZYRTEC) 10 MG tablet, Take 10 mg by mouth daily., Disp: , Rfl:    clotrimazole-betamethasone (LOTRISONE) cream, Apply 1 application topically daily. (Patient taking differently: Apply 1 application  topically as needed.), Disp: 30 g, Rfl: 2   colestipol (COLESTID) 1 g tablet, TAKE 1 TABLET BY MOUTH TWICE  DAILY, Disp: 180 tablet, Rfl: 3   escitalopram (LEXAPRO) 10 MG tablet, TAKE 1 TABLET BY MOUTH DAILY, Disp: 30 tablet, Rfl: 11   fluticasone (FLONASE) 50 MCG/ACT nasal spray, Place 2 sprays into both nostrils daily. In each nostril as needed (Patient taking differently: Place 2 sprays into both nostrils daily as needed for allergies.), Disp: 16 g, Rfl: 2   HUMIRA, 2 PEN, 40 MG/0.4ML pen, INJECT 1 PEN SUBCUTANEOUSLY  EVERY OTHER WEEK, Disp: 2 each, Rfl: 3   hyoscyamine (LEVSIN SL) 0.125 MG SL tablet, Use 1 tablet daily as needed for rectal spasms (Patient taking differently: as needed. Use 1 tablet daily as needed for rectal spasms), Disp: 30 tablet, Rfl: 0   ibuprofen (ADVIL,MOTRIN) 200 MG tablet, Take 600 mg by mouth every 6 (six) hours as needed for moderate pain. , Disp: , Rfl:    levothyroxine (SYNTHROID) 88 MCG tablet, TAKE 1 TABLET BY MOUTH DAILY  BEFORE BREAKFAST, Disp: 90 tablet, Rfl: 0   Multiple Vitamin (MULTIVITAMIN) tablet, Take 1 tablet by mouth daily., Disp: , Rfl:    ondansetron (ZOFRAN-ODT) 4 MG disintegrating tablet, Take 1 tablet (4 mg total) by mouth every 8 (eight) hours as needed for nausea or vomiting., Disp: 20 tablet, Rfl: 0   Probiotic Product (PROBIOTIC BLEND PO), Take 1 tablet by mouth daily. Align, Disp: , Rfl:    tamsulosin (FLOMAX) 0.4 MG CAPS capsule, Take 1 capsule (0.4 mg total) by mouth daily.,  Disp: 30 capsule, Rfl: 0   traZODone (DESYREL) 50 MG tablet, TAKE 1/2 TO 1 TABLET BY MOUTH AT BEDTIME AS NEEDED FOR SLEEP, Disp: 90 tablet, Rfl: 3   VITAMIN E PO, Take 1 capsule by mouth daily. , Disp: , Rfl:   Observations/Objective: Patient is well-developed, well-nourished in no acute distress.  Resting comfortably  at home.  Head is normocephalic, atraumatic.  No labored breathing.  Speech is clear and coherent with logical content.  Patient is alert and oriented at baseline.    Assessment and Plan: There are no diagnoses linked to this encounter. Increase fluids, humidifier at night, tylenol or ibuprofen as directed ,UC if needed.   Follow Up Instructions: I discussed the assessment and treatment plan with the patient. The patient was provided an opportunity to ask questions and all were answered. The patient agreed with the plan and demonstrated an understanding of the instructions.  A copy  of instructions were sent to the patient via MyChart unless otherwise noted below.     The patient was advised to call back or seek an in-person evaluation if the symptoms worsen or if the condition fails to improve as anticipated.    Georgana Curio, FNP

## 2023-07-20 NOTE — Patient Instructions (Signed)

## 2023-07-30 ENCOUNTER — Encounter: Payer: Self-pay | Admitting: Internal Medicine

## 2023-08-01 ENCOUNTER — Encounter: Payer: Self-pay | Admitting: Family Medicine

## 2023-08-01 ENCOUNTER — Ambulatory Visit (INDEPENDENT_AMBULATORY_CARE_PROVIDER_SITE_OTHER): Payer: 59 | Admitting: Family Medicine

## 2023-08-01 VITALS — BP 132/82 | HR 79 | Temp 98.4°F | Ht 66.0 in | Wt 159.6 lb

## 2023-08-01 DIAGNOSIS — H6992 Unspecified Eustachian tube disorder, left ear: Secondary | ICD-10-CM | POA: Diagnosis not present

## 2023-08-01 DIAGNOSIS — H938X2 Other specified disorders of left ear: Secondary | ICD-10-CM

## 2023-08-01 NOTE — Progress Notes (Signed)
Established Patient Office Visit   Subjective  Patient ID: [name], female    DOB: 11/07/1965  Age: 57 y.o. MRN: [id number]  Chief Complaint  Patient presents with   Cough    Patient came in today for cough congestion and left ear pressure, started 3 weeks ago, patwas seen at Urgent Care 2 weeks ago given Doxy and Prednisone     Patient is a 57 yo female followed by Dr. Lawerance Bach and seen for ongoing concern.  Patient endorses being seen at [facility] for cough, congestion, sinusitis x 3 weeks ago.  Patient treated with doxycycline and prednisone.  Symptoms seemed to improve but left ear with pressure, popping, and decreased hearing.  Patient using Flonase.  Tried Sudafed.  Using saline rinse and humidifiers at home due to history of Crohn's which causes sores and nasal passage.  Denies fever, headache, chills, facial pain/pressure, sore throat.    Patient Active Problem List   Diagnosis Date Noted   Flank pain 07/03/2023   Microscopic hematuria 07/03/2023   Travel advice encounter 09/11/2022   ETD (Eustachian tube dysfunction), left 06/20/2022   Vaginal yeast infection 06/20/2022   Ganglion cyst of foot 04/01/2022   Chronic fatigue 04/01/2022   Sleep difficulties 03/20/2021   High vitamin D level 03/19/2021   Rib pain on left side 06/01/2019   Prediabetes 03/14/2018   Cervicogenic headache 03/11/2018   History of breast cancer 01/24/2017   Overactive bladder 01/24/2017   PVC (premature ventricular contraction) 07/15/2016   Anxiety associated with depression 11/09/2013   Crohn's colitis (HCC) 11/09/2012   Osteopenia 10/29/2012   Hypothyroidism 04/22/2012   GOITER, MULTINODULAR 07/14/2007   INCONTINENCE, URGE 10/09/2006   Past Medical History:  Diagnosis Date   Anxiety    Asthma    Cancer (HCC)    breast right x 2   Chronic headaches    Granulomatous colitis (HCC)    Heart murmur    History of bone density study    no osteopenia   History of MRI of cervical spine  11/2001   mild DJD   HX: breast cancer 11/98 and 12/03   recurrance   Hypothyroidism    IBS (irritable bowel syndrome)    Inguinal hernia    Personal history of chemotherapy    Personal history of radiation therapy    Sleep apnea    "mild"   Past Surgical History:  Procedure Laterality Date   INGUINAL HERNIA REPAIR Right    MASTECTOMY Right 2010   TOTAL ABDOMINAL HYSTERECTOMY W/ BILATERAL SALPINGOOPHORECTOMY  2007   left cervix in place   Social History   Tobacco Use   Smoking status: Never    Passive exposure: Never   Smokeless tobacco: Never  Vaping Use   Vaping status: Never Used  Substance Use Topics   Alcohol use: Yes    Comment: occas   Drug use: No   Family History  Problem Relation Age of Onset   Other Mother    Hypertension Mother    Colon polyps Mother    Irritable bowel syndrome Mother    Skin cancer Father    Melanoma Father    Colon polyps Brother    Psoriasis Brother    Dermatomyositis Brother    Breast cancer Paternal Aunt    Diabetes Neg Hx    Heart disease Neg Hx    Stroke Neg Hx    Stomach cancer Neg Hx    Colon cancer Neg Hx    Esophageal  cancer Neg Hx    Pancreatic cancer Neg Hx    Crohn's disease Neg Hx    Rectal cancer Neg Hx    Allergies  Allergen Reactions   Sulfa Antibiotics Rash      ROS Negative unless stated above    Objective:     BP 132/82 (BP Location: Left Arm, Patient Position: Sitting, Cuff Size: Large)   Pulse 79   Temp 98.4 F (36.9 C) (Oral)   Ht 5\' 6"  (1.676 m)   Wt 159 lb 9.6 oz (72.4 kg)   SpO2 97%   BMI 25.76 kg/m    Physical Exam Constitutional:      General: She is not in acute distress.    Appearance: Normal appearance.  HENT:     Head: Normocephalic and atraumatic.     Right Ear: Hearing normal.     Left Ear: Decreased hearing noted.     Ears:     Comments: Bilateral TMs full L>R.  No effusion, erythema, suppurative fluid.    Nose: Nose normal.     Mouth/Throat:     Mouth: Mucous  membranes are moist.  Cardiovascular:     Rate and Rhythm: Normal rate and regular rhythm.     Heart sounds: Normal heart sounds. No murmur heard.    No gallop.  Pulmonary:     Effort: Pulmonary effort is normal. No respiratory distress.     Breath sounds: Normal breath sounds. No wheezing, rhonchi or rales.  Skin:    General: Skin is warm and dry.  Neurological:     Mental Status: She is alert and oriented to person, place, and time.    No results found for any visits on 08/01/23.    Assessment & Plan:  Dysfunction of left eustachian tube  Ear pressure, left  Patient recovering from recent sinusitis, now with left ear pressure likely 2/2 eustachian tube dysfunction.  Discussed using antihistamine such as Allegra, Zyrtec, Claritin, Xyzal, etc.  Okay to continue using Flonase.  Consider using [place] nasal saline gel for ulcers.  Can also use warm compresses, massage and other techniques to help relieve pressure in ear.  Given strict precautions as may develop AOM.  Return if symptoms worsen or fail to improve.   Deeann Saint, MD

## 2023-08-08 ENCOUNTER — Other Ambulatory Visit: Payer: Self-pay | Admitting: Internal Medicine

## 2023-08-14 NOTE — Telephone Encounter (Signed)
 Copied from CRM [id number]. Topic: Clinical - Medication Question >> Aug 14, 2023  1:15 PM Mosetta Putt H wrote: Reason for CRM: levothyroxine (SYNTHROID) 88 MCG tablet

## 2023-08-15 ENCOUNTER — Telehealth: Payer: Self-pay | Admitting: Internal Medicine

## 2023-08-15 NOTE — Telephone Encounter (Signed)
 Copied from CRM [id number]. Topic: Clinical - Medication Question >> Aug 15, 2023  9:38 AM [name] wrote: Reason for CRM: [facility] called to get approval for medication manufacturer name change for levothyroxine  (SYNTHROID ) 88 MCG tablet. Changing from amneal to lupin Cb#:[phone] Mzq#:[id number]  Please call pharmacy to confirm

## 2023-08-19 NOTE — Telephone Encounter (Signed)
 Spoke with Moran today and info given.

## 2023-08-25 ENCOUNTER — Encounter: Payer: Self-pay | Admitting: Gastroenterology

## 2023-08-25 ENCOUNTER — Encounter: Payer: Self-pay | Admitting: Internal Medicine

## 2023-08-25 ENCOUNTER — Telehealth: Payer: Self-pay

## 2023-08-25 NOTE — Telephone Encounter (Signed)
 Please renew the prior authorization for Humira.

## 2023-08-26 ENCOUNTER — Other Ambulatory Visit: Payer: Self-pay

## 2023-08-26 MED ORDER — LEVOTHYROXINE SODIUM 88 MCG PO TABS: ORAL_TABLET | ORAL | 0 refills | Status: DC

## 2023-08-26 MED ORDER — TRAZODONE HCL 50 MG PO TABS: 25.0000 mg | ORAL_TABLET | Freq: Every evening | ORAL | 3 refills | Status: DC | PRN

## 2023-08-26 MED ORDER — ESCITALOPRAM OXALATE 10 MG PO TABS: 10.0000 mg | ORAL_TABLET | Freq: Every day | ORAL | 11 refills | Status: DC

## 2023-08-27 ENCOUNTER — Ambulatory Visit: Payer: Managed Care, Other (non HMO) | Admitting: Gastroenterology

## 2023-08-27 ENCOUNTER — Other Ambulatory Visit (HOSPITAL_COMMUNITY): Payer: Self-pay

## 2023-08-27 ENCOUNTER — Other Ambulatory Visit (INDEPENDENT_AMBULATORY_CARE_PROVIDER_SITE_OTHER): Payer: Managed Care, Other (non HMO)

## 2023-08-27 ENCOUNTER — Encounter: Payer: Self-pay | Admitting: Gastroenterology

## 2023-08-27 ENCOUNTER — Other Ambulatory Visit: Payer: Self-pay

## 2023-08-27 VITALS — BP 114/76 | HR 67 | Ht 66.0 in | Wt 162.4 lb

## 2023-08-27 DIAGNOSIS — K5090 Crohn's disease, unspecified, without complications: Secondary | ICD-10-CM

## 2023-08-27 DIAGNOSIS — K5900 Constipation, unspecified: Secondary | ICD-10-CM

## 2023-08-27 DIAGNOSIS — K5080 Crohn's disease of both small and large intestine without complications: Secondary | ICD-10-CM | POA: Diagnosis not present

## 2023-08-27 DIAGNOSIS — R195 Other fecal abnormalities: Secondary | ICD-10-CM | POA: Diagnosis not present

## 2023-08-27 LAB — CBC WITH DIFFERENTIAL/PLATELET
Basophils Absolute: 0.1 10*3/uL (ref 0.0–0.1)
Basophils Relative: 1 % (ref 0.0–3.0)
Eosinophils Absolute: 0.1 10*3/uL (ref 0.0–0.7)
Eosinophils Relative: 2.3 % (ref 0.0–5.0)
HCT: 39.7 % (ref 36.0–46.0)
Hemoglobin: 12.9 g/dL (ref 12.0–15.0)
Lymphocytes Relative: 39.1 % (ref 12.0–46.0)
Lymphs Abs: 2.1 10*3/uL (ref 0.7–4.0)
MCHC: 32.4 g/dL (ref 30.0–36.0)
MCV: 85.4 fL (ref 78.0–100.0)
Monocytes Absolute: 0.5 10*3/uL (ref 0.1–1.0)
Monocytes Relative: 8.9 % (ref 3.0–12.0)
Neutro Abs: 2.6 10*3/uL (ref 1.4–7.7)
Neutrophils Relative %: 48.7 % (ref 43.0–77.0)
Platelets: 224 10*3/uL (ref 150.0–400.0)
RBC: 4.65 Mil/uL (ref 3.87–5.11)
RDW: 13.5 % (ref 11.5–15.5)
WBC: 5.4 10*3/uL (ref 4.0–10.5)

## 2023-08-27 LAB — COMPREHENSIVE METABOLIC PANEL
ALT: 13 U/L (ref 0–35)
AST: 15 U/L (ref 0–37)
Albumin: 4.5 g/dL (ref 3.5–5.2)
Alkaline Phosphatase: 58 U/L (ref 39–117)
BUN: 17 mg/dL (ref 6–23)
CO2: 29 meq/L (ref 19–32)
Calcium: 9.3 mg/dL (ref 8.4–10.5)
Chloride: 103 meq/L (ref 96–112)
Creatinine, Ser: 0.66 mg/dL (ref 0.40–1.20)
GFR: 96.84 mL/min (ref >60.00)
Glucose, Bld: 96 mg/dL (ref 70–99)
Potassium: 3.6 meq/L (ref 3.5–5.1)
Sodium: 139 meq/L (ref 135–145)
Total Bilirubin: 0.3 mg/dL (ref 0.2–1.2)
Total Protein: 6.8 g/dL (ref 6.0–8.3)

## 2023-08-27 LAB — IBC + FERRITIN
Ferritin: 34.8 ng/mL (ref 10.0–291.0)
Iron: 65 ug/dL (ref 42–145)
Saturation Ratios: 23.1 % (ref 20.0–50.0)
TIBC: 281.4 ug/dL (ref 250.0–450.0)
Transferrin: 201 mg/dL — ABNORMAL LOW (ref 212.0–360.0)

## 2023-08-27 LAB — B12 AND FOLATE PANEL
Folate: >25.2 ng/mL (ref >5.9)
Vitamin B-12: >1537 pg/mL — ABNORMAL HIGH (ref 211–911)

## 2023-08-27 LAB — HIGH SENSITIVITY CRP: CRP, High Sensitivity: 0.34 mg/L (ref 0.000–5.000)

## 2023-08-27 MED ORDER — HUMIRA (2 PEN) 40 MG/0.4ML ~~LOC~~ AJKT: AUTO-INJECTOR | SUBCUTANEOUS | 11 refills | Status: DC

## 2023-08-27 NOTE — Telephone Encounter (Signed)
 No mention of PA needed - must be sent to specialty pharmacy and they should be able to process it.

## 2023-08-27 NOTE — Patient Instructions (Addendum)
Your provider has requested that you go to the basement level for lab work before leaving today. Press "B" on the elevator. The lab is located at the first door on the left as you exit the elevator.   Due to recent changes in healthcare laws, you may see the results of your imaging and laboratory studies on MyChart before your provider has had a chance to review them.  We understand that in some cases there may be results that are confusing or concerning to you. Not all laboratory results come back in the same time frame and the provider may be waiting for multiple results in order to interpret others.  Please give [place] 48 hours in order for your provider to thoroughly review all the results before contacting the office for clarification of your results.    Follow up in 1 year   VISIT SUMMARY:  During today's visit, we discussed your Crohn's disease management, current symptoms, and general health maintenance. You reported occasional missed doses of your medication and some recent symptoms, including mucus in your stool and constipation. We also reviewed your current supplements and upcoming health screenings.  YOUR PLAN:  -CROHN'S DISEASE: Crohn's disease is a chronic inflammatory condition of the gastrointestinal tract. You reported missing some doses of your medication, Humira, but noted that missing a single dose does not typically cause symptoms. Currently, you are experiencing mucus in your stool but no diarrhea, blood, or pain. We recommend continuing your Humira injections twice a month and encourage you to adhere to your medication schedule consistently. We will also order blood tests to check your overall health and monitor your B12 and iron levels. Please continue taking your multivitamin and B complex supplements. Additionally, we will check your TB status.  -CONSTIPATION: Constipation is when you have infrequent or hard-to-pass bowel movements. You mentioned experiencing hard stools, which  you believe is due to not drinking enough water. We recommend increasing your water intake and considering the use of Miralax if needed.  -GENERAL HEALTH MAINTENANCE: For your overall health, continue with your regular dermatology and dental check-ups. You are due for a colonoscopy in 2028.  INSTRUCTIONS:  Please follow up with the recommended blood tests (CBC, CMP, CRP, B12, and iron levels) and ensure you stay consistent with your Humira injections. Increase your water intake to help with constipation and use Miralax if necessary. Continue your regular check-ups with your dermatologist and dentist, and plan for your next colonoscopy in 2028.  _______________________________________________________  If your blood pressure at your visit was 140/90 or greater, please contact your primary care physician to follow up on this.  _______________________________________________________  If you are age 72 or older, your body mass index should be between 23-30. Your Body mass index is 26.21 kg/m. If this is out of the aforementioned range listed, please consider follow up with your Primary Care Provider.  If you are age 9 or younger, your body mass index should be between 19-25. Your Body mass index is 26.21 kg/m. If this is out of the aformentioned range listed, please consider follow up with your Primary Care Provider.   ________________________________________________________  The [facility] providers would like to encourage you to use [facility] to communicate with providers for non-urgent requests or questions.  Due to long hold times on the telephone, sending your provider a message by [facility] may be a faster and more efficient way to get a response.  Please allow 48 business hours for a response.  Please remember that this  is for non-urgent requests.  _______________________________________________________   I appreciate the  opportunity to care for you  Thank You   Marsa Aris , MD

## 2023-08-27 NOTE — Telephone Encounter (Signed)
 Patient in the office now, wants an update

## 2023-08-27 NOTE — Telephone Encounter (Signed)
 Prescription refill for Humira  transmitted to the previous pharmacy [facility].  [facility] sent a fax instructing us  to send the prescription to [facility]. Prescription for Humira  transmitted to [facility].

## 2023-08-27 NOTE — Progress Notes (Signed)
[name]    [id number]    Aug 18, 1965  Primary Care Physician:Burns, Bobette Mo, MD  Referring Physician: Pincus Sanes, MD [address] [place],  [place] [address]   Chief complaint:  Crohn's disease  Discussed the use of AI scribe software for clinical note transcription with the patient, who gave verbal consent to proceed.  History of Present Illness   58 year old very pleasant female with h/o crohn's colitis initially diagnosed in 2013 , is here for follow up visit for Crohn's disease , reports a generally stable condition, with occasional missed doses of her prescribed medication. She estimates missing her twice-monthly injections approximately three times a year. She notes that missing a single dose does not typically affect her, but missing two consecutive doses can lead to symptoms.  She describes a "weird hollow sensation" in her upper abdomen as the first sign of a flare, which she finds uncomfortable but not painful. During flares, she experiences increased diarrhea and mucus in her stool. Currently, she reports having mucus in her stool but denies diarrhea, blood in stool, and pain.  She also mentions constipation, which she attributes to inadequate water intake, resulting in "rabbit pellet" stools. She acknowledges a recent increase in her consumption of iced coffee and a decrease in water intake, particularly on her days off when she spends time in her home workshop.  She also mentions a history of low B12 and borderline low iron levels, for which she takes a B complex supplement and iron pills, respectively. She is due for a bone density scan and regularly sees a dermatologist and dentist.  She also reports a change in her work schedule, now working four long days a week, which she finds busy but manageable. She mentions a preference for later start times due to not being a morning person. She also mentions a recent change in her insurance.        Colonoscopy 05/28/22: - One 5 mm polyp in the transverse colon, removed with a cold snare. Resected and retrieved. - Diverticulosis in the sigmoid colon, in the descending colon and in the ascending colon. - Non-bleeding external and internal hemorrhoids. - Simple Endoscopic Score for Crohn's Disease: 0, mucosal inflammatory changes secondary to Crohn's  disease, in remission.   Colonoscopy 02/17/2017: Random biopsies from ileum and colon showed no active inflammation or dysplasia.    Bone density scan September 06, 2019: Showed osteopenia T score -1.9   Adalimumab drug trough and antibody level October 12, 2019: 8.3 [at therapeutic target] and undetectable respectively    Outpatient Encounter Medications as of 08/27/2023  Medication Sig   albuterol (VENTOLIN HFA) 108 (90 Base) MCG/ACT inhaler Inhale 2 puffs into the lungs every 6 (six) hours as needed for wheezing or shortness of breath.   b complex vitamins capsule Take 1 capsule by mouth at bedtime.    Biotin 5000 MCG CAPS Take 5,000 mcg by mouth daily.   Calcium Carb-Cholecalciferol (CALCIUM + D3 PO) Take 1 tablet by mouth at bedtime.    cetirizine (ZYRTEC) 10 MG tablet Take 10 mg by mouth daily.   clotrimazole-betamethasone (LOTRISONE) cream Apply 1 application topically daily. (Patient taking differently: Apply 1 application  topically as needed.)   colestipol (COLESTID) 1 g tablet TAKE 1 TABLET BY MOUTH TWICE  DAILY   escitalopram (LEXAPRO) 10 MG tablet Take 1 tablet (10 mg total) by mouth daily.   fluticasone (FLONASE) 50 MCG/ACT nasal spray Place  2 sprays into both nostrils daily. In each nostril as needed (Patient taking differently: Place 2 sprays into both nostrils daily as needed for allergies.)   hyoscyamine (LEVSIN SL) 0.125 MG SL tablet Use 1 tablet daily as needed for rectal spasms (Patient taking differently: as needed. Use 1 tablet daily as needed for rectal spasms)   ibuprofen (ADVIL,MOTRIN) 200 MG tablet Take 600 mg by mouth  every 6 (six) hours as needed for moderate pain.    levothyroxine (SYNTHROID) 88 MCG tablet TAKE 1 TABLET BY MOUTH DAILY  BEFORE BREAKFAST   Multiple Vitamin (MULTIVITAMIN) tablet Take 1 tablet by mouth daily.   ondansetron (ZOFRAN-ODT) 4 MG disintegrating tablet Take 1 tablet (4 mg total) by mouth every 8 (eight) hours as needed for nausea or vomiting.   Probiotic Product (PROBIOTIC BLEND PO) Take 1 tablet by mouth daily. Align   tamsulosin (FLOMAX) 0.4 MG CAPS capsule TAKE 1 CAPSULE(0.4 MG) BY MOUTH DAILY   traZODone (DESYREL) 50 MG tablet Take 0.5-1 tablets (25-50 mg total) by mouth at bedtime as needed. for sleep   VITAMIN E PO Take 1 capsule by mouth daily.    [DISCONTINUED] HUMIRA, 2 PEN, 40 MG/0.4ML pen INJECT 1 PEN SUBCUTANEOUSLY  EVERY OTHER WEEK   No facility-administered encounter medications on file as of 08/27/2023.    Allergies as of 08/27/2023 - Review Complete 08/27/2023  Allergen Reaction Noted   Sulfa antibiotics Rash 09/16/2011    Past Medical History:  Diagnosis Date   Anxiety    Asthma    Cancer (HCC)    breast right x 2   Chronic headaches    Granulomatous colitis (HCC)    Heart murmur    History of bone density study    no osteopenia   History of MRI of cervical spine 11/2001   mild DJD   HX: breast cancer 11/98 and 12/03   recurrance   Hypothyroidism    IBS (irritable bowel syndrome)    Inguinal hernia    Personal history of chemotherapy    Personal history of radiation therapy    Sleep apnea    "mild"    Past Surgical History:  Procedure Laterality Date   INGUINAL HERNIA REPAIR Right    MASTECTOMY Right 2010   TOTAL ABDOMINAL HYSTERECTOMY W/ BILATERAL SALPINGOOPHORECTOMY  2007   left cervix in place    Family History  Problem Relation Age of Onset   Other Mother    Hypertension Mother    Colon polyps Mother    Irritable bowel syndrome Mother    Skin cancer Father    Melanoma Father    Colon polyps Brother    Psoriasis Brother     Dermatomyositis Brother    Breast cancer Paternal Aunt    Diabetes Neg Hx    Heart disease Neg Hx    Stroke Neg Hx    Stomach cancer Neg Hx    Colon cancer Neg Hx    Esophageal cancer Neg Hx    Pancreatic cancer Neg Hx    Crohn's disease Neg Hx    Rectal cancer Neg Hx     Social History   Socioeconomic History   Marital status: Single    Spouse name: Not on file   Number of children: 0   Years of education: Not on file   Highest education level: Bachelor's degree (e.g., BA, AB, BS)  Occupational History   Occupation: managed care    Employer: [employer]  Tobacco Use   Smoking status:  Never    Passive exposure: Never   Smokeless tobacco: Never  Vaping Use   Vaping status: Never Used  Substance and Sexual Activity   Alcohol use: Yes    Comment: occas   Drug use: No   Sexual activity: Not on file  Other Topics Concern   Not on file  Social History Narrative   Exercise; runs   Social Drivers of Health   Financial Resource Strain: Low Risk  (07/31/2023)   Overall Financial Resource Strain (CARDIA)    Difficulty of Paying Living Expenses: Not hard at all  Food Insecurity: No Food Insecurity (07/31/2023)   Hunger Vital Sign    Worried About Running Out of Food in the Last Year: Never true    Ran Out of Food in the Last Year: Never true  Transportation Needs: No Transportation Needs (07/31/2023)   PRAPARE - Administrator, Civil Service (Medical): No    Lack of Transportation (Non-Medical): No  Physical Activity: Insufficiently Active (07/31/2023)   Exercise Vital Sign    Days of Exercise per Week: 2 days    Minutes of Exercise per Session: 30 min  Stress: No Stress Concern Present (07/31/2023)   [employer] of Occupational Health - Occupational Stress Questionnaire    Feeling of Stress : Only a little  Social Connections: Unknown (07/31/2023)   Social Connection and Isolation Panel [NHANES]    Frequency of Communication with Friends and Family:  Patient declined    Frequency of Social Gatherings with Friends and Family: Once a week    Attends Religious Services: Never    Database administrator or Organizations: Yes    Attends Banker Meetings: Never    Marital Status: Divorced  Catering manager Violence: Not on file      Review of systems: All other review of systems negative except as mentioned in the HPI.   Physical Exam: Vitals:   08/27/23 0827  BP: 114/76  Pulse: 67  SpO2: 98%   Body mass index is 26.21 kg/m. Gen:      No acute distress HEENT:  sclera anicteric Abd:      soft, non-tender; no palpable masses, no distension Ext:    No edema Neuro: alert and oriented x 3 Psych: normal mood and affect  Data Reviewed:  Reviewed labs, radiology imaging, old records and pertinent past GI work up     Assessment and Plan    Crohn's Disease Patient reports missing approximately three doses of Humira per year, with no significant symptoms unless two consecutive doses are missed. Currently experiencing mucus in stool, but no diarrhea, blood in stool, or pain. -Continue Humira injections twice a month. -Encourage consistent medication adherence. -Order labs including CBC, CMP, CRP, and B12. -Check iron levels due to previous borderline results. -Continue multivitamin and B complex supplementation. -Check TB status.  Constipation Patient reports hard stools and inadequate water intake. -Increase water intake. -Consider use of Miralax as needed.  General Health Maintenance -Continue regular dermatology and dental check-ups. -Plan for colonoscopy in 2028 for surveillance       The patient was provided an opportunity to ask questions and all were answered. The patient agreed with the plan and demonstrated an understanding of the instructions.  Iona Beard , MD    CC: Pincus Sanes, MD

## 2023-08-28 ENCOUNTER — Encounter: Payer: Self-pay | Admitting: Gastroenterology

## 2023-08-30 LAB — QUANTIFERON-TB GOLD PLUS
Mitogen-NIL: >10 [IU]/mL
NIL: 0.04 [IU]/mL
QuantiFERON-TB Gold Plus: NEGATIVE
TB1-NIL: 0.03 [IU]/mL
TB2-NIL: 0.03 [IU]/mL

## 2023-09-02 ENCOUNTER — Encounter: Payer: Self-pay | Admitting: Gastroenterology

## 2023-09-02 ENCOUNTER — Other Ambulatory Visit: Payer: Self-pay

## 2023-09-02 MED ORDER — LEVOTHYROXINE SODIUM 88 MCG PO TABS: ORAL_TABLET | ORAL | 1 refills | Status: DC

## 2023-09-11 ENCOUNTER — Telehealth: Payer: Self-pay

## 2023-09-11 NOTE — Telephone Encounter (Signed)
[facility]baldo Glassing to [facility] Clinical Pool (supporting Napoleon Form, MD)      09/11/23  1:23 PM from Rosann AuerbachI do apologize but I do not show a pending prior authorization.    The fastest way is for your doctor to submit the prior authorization through [email or web address]. The processing timeframes of a PA (72 hours for Physician ordered urgent request and five business days for a standard request) start once enough information is received to make a determination.''   I have ONE left. its been 17 days. Any idea if this was ever done, when, any feedback? now I'm scared.

## 2023-09-15 NOTE — Telephone Encounter (Signed)
Spoke with [facility]. They cannot send the patient the Humira until a prior authorization is done.

## 2023-09-16 ENCOUNTER — Other Ambulatory Visit (HOSPITAL_COMMUNITY): Payer: Self-pay

## 2023-09-16 ENCOUNTER — Telehealth: Payer: Self-pay | Admitting: Pharmacy Technician

## 2023-09-16 NOTE — Telephone Encounter (Signed)
 Pharmacy Patient Advocate Encounter   Received notification from CoverMyMeds that prior authorization for HUMIRA  40MG  is required/requested.   Insurance verification completed.   The patient is insured through [employer] .   Per test claim: PA required; PA submitted to above mentioned insurance via CoverMyMeds Key/confirmation #/EOC [id number] Status is pending

## 2023-09-24 ENCOUNTER — Other Ambulatory Visit (HOSPITAL_COMMUNITY): Payer: Self-pay

## 2023-09-24 NOTE — Telephone Encounter (Signed)
Thank you :)

## 2023-09-24 NOTE — Telephone Encounter (Signed)
Patient notified

## 2023-09-24 NOTE — Telephone Encounter (Signed)
Pharmacy Patient Advocate Encounter  Received notification from [employer] that Prior Authorization for Humira (2 Pen) (CF) 40MG /0.4ML auto-injector kit has been APPROVED from 09/16/2023 to 09/15/2024   PA #/Case ID/Reference #: [id number]

## 2023-09-24 NOTE — Telephone Encounter (Signed)
Prior auth approved

## 2023-11-13 ENCOUNTER — Encounter: Payer: Self-pay | Admitting: Internal Medicine

## 2023-11-13 DIAGNOSIS — M79645 Pain in left finger(s): Secondary | ICD-10-CM

## 2023-11-17 ENCOUNTER — Encounter: Payer: Self-pay | Admitting: Family Medicine

## 2023-11-17 ENCOUNTER — Ambulatory Visit: Admitting: Family Medicine

## 2023-11-17 ENCOUNTER — Other Ambulatory Visit: Payer: Self-pay

## 2023-11-17 ENCOUNTER — Ambulatory Visit (INDEPENDENT_AMBULATORY_CARE_PROVIDER_SITE_OTHER)

## 2023-11-17 VITALS — BP 120/82 | HR 66 | Ht 66.0 in | Wt 156.0 lb

## 2023-11-17 DIAGNOSIS — M65312 Trigger thumb, left thumb: Secondary | ICD-10-CM

## 2023-11-17 DIAGNOSIS — M79645 Pain in left finger(s): Secondary | ICD-10-CM | POA: Diagnosis not present

## 2023-11-17 DIAGNOSIS — S61432A Puncture wound without foreign body of left hand, initial encounter: Secondary | ICD-10-CM | POA: Diagnosis not present

## 2023-11-17 NOTE — Progress Notes (Signed)
 I, Stevenson Clinch, CMA acting as a scribe for Clementeen Graham, MD.  [name] is a 58 y.o. female who presents to [facility] today for L thumb pain x 1 month. Injury: sx started after puncture by drill bit.. Pt locates pain to base of the thumb. Some continued swelling. The thumb will pop at times and is tender to touch. Sx exacerbated by picking things up. Occasional burning sensation.   Grip strength: decreased d/t pain Aggravates: gripping, touch Treatments tried: Neosporin and bandage  Pertinent review of systems: No fevers or chills  Relevant historical information: Crohn's colitis.  Tetanus up-to-date.   Exam:  BP 120/82   Pulse 66   Ht 5\' 6"  (1.676 m)   Wt 156 lb (70.8 kg)   SpO2 94%   BMI 25.18 kg/m  General: Well Developed, well nourished, and in no acute distress.   MSK: Hand degenerative changes multiple locations hands across PIPs and DIPs and at first [facility].  Healed puncture wound thenar eminence palmar aspect of left hand. Nontender to palpation at the formal puncture site.  Tender palpation palmar first MCP with triggering present with flexion of the IP joint.  Strength is intact.    Lab and Radiology Results  Diagnostic Limited MSK Ultrasound of: Left thumb palmar aspect Flexor tendon is intact with no visible disruption of the tendon at the area of the puncture site.  There is some hypoechoic fluid surrounds the tendon in this area and at the A1 pulley at MCP.  This is consistent with trigger thumb and tenosynovitis Impression: Left hand trigger thumb with flexor tenosynovitis affecting the thumb.   X-ray images left hand obtained today personally and independently interpreted. Mild DJD across PIPs and first CMC.  No acute fractures visible. Await formal radiology review   Assessment and Plan: 58 y.o. female with left thumb pain.  Patient has trigger thumb.  This occurred around the same time that she had a shallow  puncture wound to the thenar eminence around the thumb.  I think there is a correlation there.  My suspicion is that she suffered effectively a contusion to the flexor tendon by the drill bit.  I do not think the drill bit penetrated enough to actually touch the tendon.  She does not have any signs or symptoms of infection now and her tetanus is up-to-date.  Plan for immobilization with double Band-Aid splint and Voltaren gel.  If not improved on recheck in about 3 weeks consider steroid injection.  I would like to avoid injection now if possible as it could weaken the tendon which may be partially compromised.   PDMP not reviewed this encounter. Orders Placed This Encounter  Procedures   [place] LIMITED JOINT SPACE STRUCTURES UP LEFT(NO LINKED CHARGES)    Reason for Exam (SYMPTOM  OR DIAGNOSIS REQUIRED):   left thumb pain    Preferred imaging location?:   [facility] Hand Complete Left    Standing Status:   Future    Number of Occurrences:   1    Expiration Date:   12/17/2023    Reason for Exam (SYMPTOM  OR DIAGNOSIS REQUIRED):   left hand pain, base of thumb, injured with drill bit    Preferred imaging location?:   [facility]    Is patient pregnant?:   No   No orders of the defined types were placed in this encounter.    Discussed warning signs or  symptoms. Please see discharge instructions. Patient expresses understanding.   The above documentation has been reviewed and is accurate and complete Clementeen Graham, M.D.

## 2023-11-17 NOTE — Patient Instructions (Signed)
 Thank you for coming in today.   Recheck in about 3 weeks. Use the double Band-Aid splint. Try Voltaren gel. Please use Voltaren gel (Generic Diclofenac Gel) up to 4x daily for pain as needed.  This is available over-the-counter as both the name brand Voltaren gel and the generic diclofenac gel.

## 2023-12-01 NOTE — Progress Notes (Signed)
 Left hand x-ray shows a tiny piece of density or metal at the end of the thumb.  Otherwise the hand x-ray looks okay with no obvious broken bones or bad arthritis.  This bit of metal or debris is not where you had your most recent drillbit injury.  I do not think it is related.  It is very shallow

## 2024-02-23 ENCOUNTER — Encounter: Payer: Self-pay | Admitting: Gastroenterology

## 2024-02-23 ENCOUNTER — Encounter: Payer: Self-pay | Admitting: Internal Medicine

## 2024-02-24 ENCOUNTER — Inpatient Hospital Stay: Payer: 59 | Attending: Oncology | Admitting: Oncology

## 2024-02-24 VITALS — BP 124/87 | HR 62 | Temp 98.6°F | Resp 18 | Ht 66.0 in | Wt 154.8 lb

## 2024-02-24 DIAGNOSIS — Z9011 Acquired absence of right breast and nipple: Secondary | ICD-10-CM | POA: Diagnosis not present

## 2024-02-24 DIAGNOSIS — Z853 Personal history of malignant neoplasm of breast: Secondary | ICD-10-CM | POA: Insufficient documentation

## 2024-02-24 NOTE — Progress Notes (Signed)
  [facility] OFFICE PROGRESS NOTE   Diagnosis: Breast cancer  INTERVAL HISTORY:   [name] returns as scheduled.  She reports no change at the left breast or right chest wall.  A left mammogram on 07/07/2023 was negative. She has chronic exertional dyspnea.  She reports a fullness in the throat for the past few months.  She wonders whether this is related to reflux or sinus drainage.  She has not noted drainage.  Objective:  Vital signs in last 24 hours:  Blood pressure 124/87, pulse 62, temperature 98.6 F (37 C), temperature source Temporal, resp. rate 18, height 5' 6 (1.676 m), weight 154 lb 12.8 oz (70.2 kg), SpO2 100%.    HEENT: Oropharynx without erythema or exudate Lymphatics: No cervical, supraclavicular, or axillary nodes Resp: Lungs with coarse end inspiratory rhonchi at the right posterior base, no respiratory distress Cardio: Regular rate and rhythm GI: No hepatosplenomegaly Vascular: No leg edema Breast: Right mastectomy with a TRAM reconstruction, firm tissue at the medial aspect of the TRAM, no evidence for local tumor recurrence.  Left breast without mass. Lab Results:  Lab Results  Component Value Date   WBC 5.4 08/27/2023   HGB 12.9 08/27/2023   HCT 39.7 08/27/2023   MCV 85.4 08/27/2023   PLT 224.0 08/27/2023   NEUTROABS 2.6 08/27/2023    CMP  Lab Results  Component Value Date   NA 139 08/27/2023   K 3.6 08/27/2023   CL 103 08/27/2023   CO2 29 08/27/2023   GLUCOSE 96 08/27/2023   BUN 17 08/27/2023   CREATININE 0.66 08/27/2023   CALCIUM 9.3 08/27/2023   PROT 6.8 08/27/2023   ALBUMIN 4.5 08/27/2023   AST 15 08/27/2023   ALT 13 08/27/2023   ALKPHOS 58 08/27/2023   BILITOT 0.3 08/27/2023   GFRNONAA 59 (L) 07/28/2021   GFRAA >60 03/30/2011     Medications: I have reviewed the patient's current medications.   Assessment/Plan: [name] was diagnosed with right-sided breast cancer in November 1998. She developed a right  breast recurrence in December 2003 and underwent a right mastectomy/TRAM reconstruction. She completed adjuvant AC and Taxol chemotherapy. She took tamoxifen August 2004 through July 2009. She began Femara  04/12/2008, discontinued at the end of 2014.   She underwent an ultrasound-guided biopsy of a suspicious lesion at the 9 o'clock position of the left breast in March 2023.  The pathology revealed benign fibrocystic change.     Disposition: [name] remains in clinical remission from breast cancer.  She will continue yearly mammography.  She would like to continue follow-up in the oncology clinic.  She will return for an office visit in 1 year. She plans to see Dr.s Geofm and Nandigam to evaluate the respiratory symptoms.  Arley Hof, MD  02/24/2024  3:02 PM

## 2024-03-02 ENCOUNTER — Other Ambulatory Visit: Payer: Self-pay | Admitting: Internal Medicine

## 2024-03-04 NOTE — Progress Notes (Unsigned)
    Subjective:    Patient ID: [name], female    DOB: [birth year], 58 y.o.   MRN: [id number]      HPI Coreen is here for No chief complaint on file.   GERD:     Medications and allergies reviewed with patient and updated if appropriate.  Current Outpatient Medications on File Prior to Visit  Medication Sig Dispense Refill   albuterol  (VENTOLIN  HFA) 108 (90 Base) MCG/ACT inhaler Inhale 2 puffs into the lungs every 6 (six) hours as needed for wheezing or shortness of breath. 8 g 0   b complex vitamins capsule Take 1 capsule by mouth at bedtime.      Biotin 5000 MCG CAPS Take 5,000 mcg by mouth daily.     Calcium Carb-Cholecalciferol (CALCIUM + D3 PO) Take 1 tablet by mouth at bedtime.      cetirizine  (ZYRTEC ) 10 MG tablet Take 10 mg by mouth daily.     clotrimazole -betamethasone  (LOTRISONE ) cream Apply 1 application topically daily. (Patient taking differently: Apply 1 application  topically as needed.) 30 g 2   colestipol  (COLESTID ) 1 g tablet TAKE 1 TABLET BY MOUTH TWICE  DAILY 180 tablet 3   escitalopram  (LEXAPRO ) 10 MG tablet Take 1 tablet (10 mg total) by mouth daily. 30 tablet 11   fluticasone  (FLONASE ) 50 MCG/ACT nasal spray Place 2 sprays into both nostrils daily. In each nostril as needed (Patient taking differently: Place 2 sprays into both nostrils daily as needed for allergies.) 16 g 2   HUMIRA , 2 PEN, 40 MG/0.4ML pen INJECT 1 PEN SUBCUTANEOUSLY  EVERY OTHER WEEK 2 each 11   hyoscyamine  (LEVSIN  SL) 0.125 MG SL tablet Use 1 tablet daily as needed for rectal spasms (Patient taking differently: as needed. Use 1 tablet daily as needed for rectal spasms) 30 tablet 0   ibuprofen (ADVIL,MOTRIN) 200 MG tablet Take 600 mg by mouth every 6 (six) hours as needed for moderate pain.      levothyroxine  (SYNTHROID ) 88 MCG tablet TAKE 1 TABLET DAILY BEFORE BREAKFAST 90 tablet 3   Multiple Vitamin (MULTIVITAMIN) tablet Take 1 tablet by mouth daily.     ondansetron  (ZOFRAN -ODT) 4  MG disintegrating tablet Take 1 tablet (4 mg total) by mouth every 8 (eight) hours as needed for nausea or vomiting. 20 tablet 0   Probiotic Product (PROBIOTIC BLEND PO) Take 1 tablet by mouth daily. Align     tamsulosin  (FLOMAX ) 0.4 MG CAPS capsule TAKE 1 CAPSULE(0.4 MG) BY MOUTH DAILY 30 capsule 0   traZODone  (DESYREL ) 50 MG tablet Take 0.5-1 tablets (25-50 mg total) by mouth at bedtime as needed. for sleep 90 tablet 3   VITAMIN E PO Take 1 capsule by mouth daily.      No current facility-administered medications on file prior to visit.    Review of Systems     Objective:  There were no vitals filed for this visit. BP Readings from Last 3 Encounters:  02/24/24 124/87  11/17/23 120/82  08/27/23 114/76   Wt Readings from Last 3 Encounters:  02/24/24 154 lb 12.8 oz (70.2 kg)  11/17/23 156 lb (70.8 kg)  08/27/23 162 lb 6 oz (73.7 kg)   There is no height or weight on file to calculate BMI.    Physical Exam         Assessment & Plan:    See Problem List for Assessment and Plan of chronic medical problems.

## 2024-03-05 ENCOUNTER — Ambulatory Visit: Admitting: Internal Medicine

## 2024-03-05 ENCOUNTER — Encounter: Payer: Self-pay | Admitting: Internal Medicine

## 2024-03-05 VITALS — BP 116/82 | HR 79 | Temp 98.8°F | Ht 66.0 in | Wt 154.0 lb

## 2024-03-05 DIAGNOSIS — K219 Gastro-esophageal reflux disease without esophagitis: Secondary | ICD-10-CM | POA: Diagnosis not present

## 2024-03-05 MED ORDER — OMEPRAZOLE 20 MG PO CPDR: 20.0000 mg | DELAYED_RELEASE_CAPSULE | Freq: Every day | ORAL | 1 refills | Status: DC

## 2024-03-05 NOTE — Patient Instructions (Addendum)
 Medications changes include :   omeprazole 20 mg twice daily or 40 mg daily x 2 weeks -- then once a day.  Take 30 minutes prior to a meal.       Return if symptoms worsen or fail to improve.    GERD in Adults: What to Know  Gastroesophageal reflux (GER) is when acid from your stomach flows up into your esophagus. Your esophagus is the part of your body that moves food from your mouth to your stomach. Normally, food goes down and stays in your stomach to be digested. But with GER, food and stomach acid may go back up. You may have a disease called gastroesophageal reflux disease (GERD) if the reflux: Happens often. Causes very bad symptoms. Makes your esophagus sore and swollen. Over time, GERD can make small holes called ulcers in the lining of your esophagus. What are the causes? GERD is caused by a problem with the muscle between your esophagus and stomach. This muscle is called the lower esophageal sphincter (LES). When it's weak or not normal, it doesn't close like it should. This means food and stomach acid can go back up into your esophagus. The muscle can be weak if: You smoke or use products with tobacco in them. You're pregnant. You have a type of hernia called a hiatal hernia. You eat certain foods and drinks. These include: Alcohol. Coffee. Chocolate. Onions. Peppermint. What increases the risk? Being overweight. Having a disease that affects your connective tissue. Taking NSAIDs, such as ibuprofen. What are the signs or symptoms? Heartburn. Trouble swallowing. Pain when you swallow. The feeling of having a lump in your throat. A bitter taste in your mouth. Bad breath. Having an upset or bloated stomach. Burping. Chest pain. Other conditions can also cause chest pain. Make sure you see your health care provider if you have chest pain. Wheezing. This is when you make high-pitched whistling sounds when you breathe, most often when you breathe  out. A long-term cough or a cough at night. How is this diagnosed? GERD may be diagnosed based on your medical history and a physical exam. You may also have tests. These may include: An endoscopy. This test looks at your stomach and esophagus with a small camera. A barium swallow test. This shows the shape and size of your esophagus and how well it's working. Tests of your esophagus to check for: Acid levels. Pressure. How is this treated? Treatment may depend on how bad your symptoms are. It may include: Changes to your diet and daily life. Medicines. Surgery. Follow these instructions at home: Eating and drinking Follow an eating plan as told by your provider. You may need to avoid certain foods and drinks. These may include: Coffee and tea, with or without caffeine. Alcohol. Energy drinks and sports drinks. Fizzy drinks or sodas. Chocolate and cocoa. Peppermint and mint flavorings. Garlic and onions. Horseradish. Spicy and acidic foods. These include: Peppers. Chili powder and curry powder. Vinegar. Hot sauces and BBQ sauce. Citrus fruits and juices. These include: Oranges. Lemons. Limes. Tomato-based foods. These include: Red sauce and pizza with red sauce. Chili. Salsa. Fried and fatty foods. These include: Donuts. [place] fries. Potato chips. High-fat dressings. High-fat meats. These include: Hot dogs and sausage. Rib eye steak. Ham and bacon. High-fat dairy items. These include: Whole milk. Butter. Cream cheese. Eat small meals often. Avoid eating big meals. Avoid drinking lots of liquid with your meals. Try not to eat meals  during the 2-3 hours before bedtime. Try not to lie down right after you eat. Do not exercise right after you eat. Lifestyle  If you're overweight, lose an amount of weight that's healthy for you. Ask your provider about a safe weight loss goal. Do not smoke, vape, or use nicotine or tobacco. Wear loose clothes. Do not wear  things that are tight around your waist. When you sleep, try: Raising the head of your bed about 6 inches (15 cm). You can use a wedge to do this. Lying down on your left side. Try to lower your stress. If you need help doing this, ask your provider. General instructions Take your medicines only as told. Do not take aspirin or ibuprofen unless you're told to. Watch for any changes in your symptoms. Do not bend over if it makes your symptoms worse. Contact a health care provider if: You have new symptoms. You have trouble: Drinking. Swallowing. Eating. It hurts to swallow. You have wheezing. You have a cough that won't go away. Your voice is hoarse. Your symptoms don't get better with treatment. Get help right away if: You have pain all of a sudden in your: Arm. Neck. Jaw. Teeth. Back. You feel sweaty, dizzy, or light-headed all of a sudden. You faint. You have chest pain or shortness of breath. You vomit and the vomit is: Green, yellow, or black. Looks like blood or coffee grounds. Your poop is red, bloody, or black. These symptoms may be an emergency. Call 911 right away. Do not wait to see if the symptoms will go away. Do not drive yourself to the hospital. This information is not intended to replace advice given to you by your health care provider. Make sure you discuss any questions you have with your health care provider. Document Revised: 06/10/2023 Document Reviewed: 12/25/2022 Elsevier Patient Education  2024 [employer].

## 2024-03-05 NOTE — Assessment & Plan Note (Signed)
 New Has frequent and chronic throat clearing multiple times a day and globus sensation No true reflux that she is aware of, denies PND Symptoms consistent with probable GERD She does have Crohn's Start omeprazole 40 mg daily x 2 weeks-take 30 minutes prior to meal and then decrease to once daily If symptoms are not controlled advised her to follow-up so that we can adjust medication Advised to discuss with Dr Nandigam when she sees her next

## 2024-03-28 ENCOUNTER — Other Ambulatory Visit: Payer: Self-pay | Admitting: Internal Medicine

## 2024-04-28 ENCOUNTER — Encounter: Payer: Self-pay | Admitting: Internal Medicine

## 2024-04-28 DIAGNOSIS — Z87442 Personal history of urinary calculi: Secondary | ICD-10-CM | POA: Insufficient documentation

## 2024-04-28 NOTE — Patient Instructions (Addendum)

## 2024-04-28 NOTE — Progress Notes (Unsigned)
 Subjective:    Patient ID: [name], female    DOB: February 05, 1966, 58 y.o.   MRN: [id number]      HPI [name] is here for a Physical exam and her chronic medical problems.   Throat clearing better - has nausea in am -yesterday took her medications when she first woke up and felt like he wanted to vomit which is new.  She often has some nausea but has never felt like she was going to vomit.  Drinking some coffee helped to relieve the symptoms.  She denies any heartburn but never had any heartburn or reflux.   Medications and allergies reviewed with patient and updated if appropriate.  Current Outpatient Medications on File Prior to Visit  Medication Sig Dispense Refill   albuterol  (VENTOLIN  HFA) 108 (90 Base) MCG/ACT inhaler Inhale 2 puffs into the lungs every 6 (six) hours as needed for wheezing or shortness of breath. 8 g 0   b complex vitamins capsule Take 1 capsule by mouth at bedtime.      Biotin 5000 MCG CAPS Take 5,000 mcg by mouth daily.     Calcium Carb-Cholecalciferol (CALCIUM + D3 PO) Take 1 tablet by mouth at bedtime.      cetirizine  (ZYRTEC ) 10 MG tablet Take 10 mg by mouth daily.     clotrimazole -betamethasone  (LOTRISONE ) cream Apply 1 application topically daily. (Patient taking differently: Apply 1 application  topically as needed.) 30 g 2   colestipol  (COLESTID ) 1 g tablet TAKE 1 TABLET BY MOUTH TWICE  DAILY 180 tablet 3   escitalopram  (LEXAPRO ) 10 MG tablet Take 1 tablet (10 mg total) by mouth daily. 30 tablet 11   fluticasone  (FLONASE ) 50 MCG/ACT nasal spray Place 2 sprays into both nostrils daily. In each nostril as needed (Patient taking differently: Place 2 sprays into both nostrils daily as needed for allergies.) 16 g 2   HUMIRA , 2 PEN, 40 MG/0.4ML pen INJECT 1 PEN SUBCUTANEOUSLY  EVERY OTHER WEEK 2 each 11   hyoscyamine  (LEVSIN  SL) 0.125 MG SL tablet Use 1 tablet daily as needed for rectal spasms (Patient taking differently: as needed. Use 1 tablet daily  as needed for rectal spasms) 30 tablet 0   ibuprofen (ADVIL,MOTRIN) 200 MG tablet Take 600 mg by mouth every 6 (six) hours as needed for moderate pain.      levothyroxine  (SYNTHROID ) 88 MCG tablet TAKE 1 TABLET DAILY BEFORE BREAKFAST 90 tablet 3   Multiple Vitamin (MULTIVITAMIN) tablet Take 1 tablet by mouth daily.     omeprazole  (PRILOSEC) 20 MG capsule TAKE 30 MINUTES PRIOR TO A MEAL TWICE DAILY X 2 WEEKS, THEN ONCE DAILY 132 capsule 1   ondansetron  (ZOFRAN -ODT) 4 MG disintegrating tablet Take 1 tablet (4 mg total) by mouth every 8 (eight) hours as needed for nausea or vomiting. 20 tablet 0   Probiotic Product (PROBIOTIC BLEND PO) Take 1 tablet by mouth daily. Align     tamsulosin  (FLOMAX ) 0.4 MG CAPS capsule TAKE 1 CAPSULE(0.4 MG) BY MOUTH DAILY 30 capsule 0   traZODone  (DESYREL ) 50 MG tablet Take 0.5-1 tablets (25-50 mg total) by mouth at bedtime as needed. for sleep 90 tablet 3   VITAMIN E PO Take 1 capsule by mouth daily.      No current facility-administered medications on file prior to visit.    Review of Systems  Constitutional:  Negative for fever.  Eyes:  Negative for visual disturbance.  Respiratory:  Positive for shortness of breath (with stairs) and  wheezing (occ). Negative for cough.   Cardiovascular:  Positive for palpitations (occ). Negative for chest pain and leg swelling.  Gastrointestinal:  Positive for abdominal pain, anal bleeding (occ with constipation), constipation, diarrhea (occ) and nausea (in morning). Negative for blood in stool.       ? [name] - improved throat clearing - never feels gerd  Genitourinary:  Negative for dysuria.  Musculoskeletal:  Negative for arthralgias and back pain.  Skin:  Negative for rash.  Neurological:  Positive for light-headedness (occ with changes in position). Negative for headaches.  Psychiatric/Behavioral:  Negative for dysphoric mood. The patient is not nervous/anxious.        Objective:   Vitals:   04/29/24 1112  BP: 114/76   Pulse: 66  Temp: 98.2 F (36.8 C)  SpO2: 96%   Filed Weights   04/29/24 1112  Weight: 160 lb (72.6 kg)   Body mass index is 25.82 kg/m.  BP Readings from Last 3 Encounters:  04/29/24 114/76  03/05/24 116/82  02/24/24 124/87    Wt Readings from Last 3 Encounters:  04/29/24 160 lb (72.6 kg)  03/05/24 154 lb (69.9 kg)  02/24/24 154 lb 12.8 oz (70.2 kg)       Physical Exam Constitutional: She appears well-developed and well-nourished. No distress.  HENT:  Head: Normocephalic and atraumatic.  Right Ear: External ear normal. Normal ear canal and TM Left Ear: External ear normal.  Normal ear canal and TM Mouth/Throat: Oropharynx is clear and moist.  Eyes: Conjunctivae normal.  Neck: Neck supple. No tracheal deviation present. No thyromegaly present.  No carotid bruit  Cardiovascular: Normal rate, regular rhythm and normal heart sounds.   No murmur heard.  No edema. Pulmonary/Chest: Effort normal and breath sounds normal. No respiratory distress. She has no wheezes. She has no rales.  Breast: deferred   Abdominal: Soft. She exhibits no distension. There is no tenderness.  Lymphadenopathy: She has no cervical adenopathy.  Skin: Skin is warm and dry. She is not diaphoretic.  Psychiatric: She has a normal mood and affect. Her behavior is normal.     Lab Results  Component Value Date   WBC 5.4 08/27/2023   HGB 12.9 08/27/2023   HCT 39.7 08/27/2023   PLT 224.0 08/27/2023   GLUCOSE 96 08/27/2023   CHOL 199 04/01/2022   TRIG 100.0 04/01/2022   HDL 58.20 04/01/2022   LDLCALC 121 (H) 04/01/2022   ALT 13 08/27/2023   AST 15 08/27/2023   NA 139 08/27/2023   K 3.6 08/27/2023   CL 103 08/27/2023   CREATININE 0.66 08/27/2023   BUN 17 08/27/2023   CO2 29 08/27/2023   TSH 1.21 04/01/2022   HGBA1C 6.2 04/01/2022         Assessment & Plan:   Physical exam: Screening blood work  ordered Exercise  not regular-stressed regular exercise Weight  is good Substance  abuse  none   Reviewed recommended immunizations.   Health Maintenance  Topic Date Due   Hepatitis B Vaccines 19-59 Average Risk (1 of 3 - 19+ 3-dose series) Never done   Pneumococcal Vaccine: 50+ Years (1 of 1 - PCV) Never done   DEXA SCAN  09/05/2022   Influenza Vaccine  03/12/2024   COVID-19 Vaccine (3 - 2025-26 season) 05/15/2024 (Originally 04/12/2024)   Mammogram  07/06/2025   Colonoscopy  05/29/2027   DTaP/Tdap/Td (4 - Td or Tdap) 01/18/2030   Hepatitis C Screening  Completed   HIV Screening  Completed   Zoster Vaccines-  Shingrix   Completed   HPV VACCINES  Aged Out   Meningococcal B Vaccine  Aged Out          See Problem List for Assessment and Plan of chronic medical problems.

## 2024-04-29 ENCOUNTER — Ambulatory Visit: Admitting: Internal Medicine

## 2024-04-29 ENCOUNTER — Ambulatory Visit: Payer: Self-pay | Admitting: Internal Medicine

## 2024-04-29 VITALS — BP 114/76 | HR 66 | Temp 98.2°F | Ht 66.0 in | Wt 160.0 lb

## 2024-04-29 DIAGNOSIS — E039 Hypothyroidism, unspecified: Secondary | ICD-10-CM | POA: Diagnosis not present

## 2024-04-29 DIAGNOSIS — Z87442 Personal history of urinary calculi: Secondary | ICD-10-CM

## 2024-04-29 DIAGNOSIS — K219 Gastro-esophageal reflux disease without esophagitis: Secondary | ICD-10-CM

## 2024-04-29 DIAGNOSIS — Z0000 Encounter for general adult medical examination without abnormal findings: Secondary | ICD-10-CM | POA: Diagnosis not present

## 2024-04-29 DIAGNOSIS — E673 Hypervitaminosis D: Secondary | ICD-10-CM | POA: Diagnosis not present

## 2024-04-29 DIAGNOSIS — R7303 Prediabetes: Secondary | ICD-10-CM

## 2024-04-29 DIAGNOSIS — G479 Sleep disorder, unspecified: Secondary | ICD-10-CM

## 2024-04-29 DIAGNOSIS — M85852 Other specified disorders of bone density and structure, left thigh: Secondary | ICD-10-CM

## 2024-04-29 DIAGNOSIS — Z23 Encounter for immunization: Secondary | ICD-10-CM | POA: Diagnosis not present

## 2024-04-29 DIAGNOSIS — F418 Other specified anxiety disorders: Secondary | ICD-10-CM | POA: Diagnosis not present

## 2024-04-29 DIAGNOSIS — K5010 Crohn's disease of large intestine without complications: Secondary | ICD-10-CM

## 2024-04-29 LAB — COMPREHENSIVE METABOLIC PANEL WITH GFR
ALT: 13 U/L (ref 0–35)
AST: 18 U/L (ref 0–37)
Albumin: 4.6 g/dL (ref 3.5–5.2)
Alkaline Phosphatase: 56 U/L (ref 39–117)
BUN: 14 mg/dL (ref 6–23)
CO2: 32 meq/L (ref 19–32)
Calcium: 9.9 mg/dL (ref 8.4–10.5)
Chloride: 100 meq/L (ref 96–112)
Creatinine, Ser: 0.68 mg/dL (ref 0.40–1.20)
GFR: 95.69 mL/min (ref >60.00)
Glucose, Bld: 92 mg/dL (ref 70–99)
Potassium: 4 meq/L (ref 3.5–5.1)
Sodium: 139 meq/L (ref 135–145)
Total Bilirubin: 0.5 mg/dL (ref 0.2–1.2)
Total Protein: 7.2 g/dL (ref 6.0–8.3)

## 2024-04-29 LAB — LIPID PANEL
Cholesterol: 227 mg/dL — ABNORMAL HIGH (ref 0–200)
HDL: 54.6 mg/dL (ref >39.00)
LDL Cholesterol: 148 mg/dL — ABNORMAL HIGH (ref 0–99)
NonHDL: 172.5
Total CHOL/HDL Ratio: 4
Triglycerides: 122 mg/dL (ref 0.0–149.0)
VLDL: 24.4 mg/dL (ref 0.0–40.0)

## 2024-04-29 LAB — HEMOGLOBIN A1C: Hgb A1c MFr Bld: 6.3 % (ref 4.6–6.5)

## 2024-04-29 LAB — TSH: TSH: 1 u[IU]/mL (ref 0.35–5.50)

## 2024-04-29 LAB — VITAMIN B12: Vitamin B-12: 1001 pg/mL — ABNORMAL HIGH (ref 211–911)

## 2024-04-29 LAB — CBC
HCT: 41.6 % (ref 36.0–46.0)
Hemoglobin: 13.6 g/dL (ref 12.0–15.0)
MCHC: 32.8 g/dL (ref 30.0–36.0)
MCV: 82.7 fl (ref 78.0–100.0)
Platelets: 227 K/uL (ref 150.0–400.0)
RBC: 5.03 Mil/uL (ref 3.87–5.11)
RDW: 13.3 % (ref 11.5–15.5)
WBC: 5.3 K/uL (ref 4.0–10.5)

## 2024-04-29 LAB — VITAMIN D 25 HYDROXY (VIT D DEFICIENCY, FRACTURES): VITD: 48.48 ng/mL (ref 30.00–100.00)

## 2024-04-29 MED ORDER — FAMOTIDINE 40 MG PO TABS: 40.0000 mg | ORAL_TABLET | Freq: Every day | ORAL | 1 refills | Status: AC

## 2024-04-29 NOTE — Assessment & Plan Note (Signed)
 Chronic Lab Results  Component Value Date   HGBA1C 6.3 04/29/2024    Check a1c Low sugar / carb diet Stressed regular exercise

## 2024-04-29 NOTE — Assessment & Plan Note (Addendum)
 Chronic Has frequent and chronic throat clearing multiple times a day and globus sensation No true reflux that she is aware of, denies PND Symptoms consistent with probable GERD Started on omeprazole  2 months ago and the throat clearing has decreased Still having morning nausea and yesterday felt like she wanted to throw up which I think are related to reflux Continue omeprazole  20 mg daily (higher dose caused diarrhea close ( Start Pepcid  40 mg at bedtime

## 2024-04-29 NOTE — Assessment & Plan Note (Signed)
Chronic Controlled On Humira Management per Dr Silverio Decamp

## 2024-04-29 NOTE — Assessment & Plan Note (Signed)
 History of elevated vitamin D  level She is taking vitamin D  daily Check vitamin D  level

## 2024-04-29 NOTE — Assessment & Plan Note (Signed)
 Chronic Controlled, stable Continue lexapro  10 mg daily

## 2024-04-29 NOTE — Assessment & Plan Note (Signed)
 Chronic DEXA due-ordered Continue regular walking Continue multivitamin, vitamin D  and calcium

## 2024-04-29 NOTE — Assessment & Plan Note (Signed)
 Chronic Sleep is not ideal She has poor hygiene and she is very aware of that Stressed that she needs to improve her sleep hygiene and medication will work better or she may not necessarily need the medication Stressed setting a bedtime and sticking to it Continue trazodone  50-100 mg daily

## 2024-04-29 NOTE — Assessment & Plan Note (Signed)
Chronic  Last tsh at a good level Currently taking levothyroxine  88 mcg daily

## 2024-05-12 MED ORDER — DEXCOM G7 SENSOR MISC: 0 refills | Status: AC

## 2024-05-12 MED ORDER — DEXCOM G7 RECEIVER DEVI: 0 refills | Status: AC

## 2024-05-13 ENCOUNTER — Other Ambulatory Visit (HOSPITAL_COMMUNITY): Payer: Self-pay

## 2024-05-13 ENCOUNTER — Telehealth: Payer: Self-pay

## 2024-05-13 NOTE — Telephone Encounter (Signed)
 Pharmacy Patient Advocate Encounter   Received notification from Patient Pharmacy that prior authorization for Dexcom G7 receiver is required/requested.   Insurance verification completed.   The patient is insured through [employer].   Per test claim: PA required; PA submitted to above mentioned insurance via Latent Key/confirmation #/EOC [id number] Status is pending

## 2024-05-18 NOTE — Telephone Encounter (Signed)
 I do not think this will be covered.  Unfortunately [name] covered for prediabetes.  She can purchase it over-the-counter if she wants to

## 2024-05-18 NOTE — Telephone Encounter (Signed)
 Pharmacy Patient Advocate Encounter  Received notification from [employer] that Prior Authorization for Dexcom G7 Receiver has been DENIED.  Full denial letter will be uploaded to the media tab. See denial reason below.   PA #/Case ID/Reference #: [id number]

## 2024-06-09 ENCOUNTER — Ambulatory Visit (INDEPENDENT_AMBULATORY_CARE_PROVIDER_SITE_OTHER)
Admission: RE | Admit: 2024-06-09 | Discharge: 2024-06-09 | Disposition: A | Discharge status: Home / Self Care | Source: Ambulatory Visit | Attending: Internal Medicine | Admitting: Internal Medicine

## 2024-06-09 DIAGNOSIS — M85852 Other specified disorders of bone density and structure, left thigh: Secondary | ICD-10-CM

## 2024-06-22 ENCOUNTER — Encounter: Payer: Self-pay | Admitting: Gastroenterology

## 2024-06-22 DIAGNOSIS — K5080 Crohn's disease of both small and large intestine without complications: Secondary | ICD-10-CM

## 2024-06-25 MED ORDER — PREDNISONE 5 MG PO TABS: ORAL_TABLET | ORAL | 0 refills | Status: AC

## 2024-06-25 NOTE — Telephone Encounter (Signed)
 Please advise her to come in for CBC, BMP, CRP and fecal cal protectin. Please send Rx for Prednisone  20mg  daily X 5 days and decrease by 5 mg every 5 days until complete. Please schedule urgent visit with me or APP soon. Thanks

## 2024-06-26 ENCOUNTER — Other Ambulatory Visit: Payer: Self-pay | Admitting: Internal Medicine

## 2024-07-14 ENCOUNTER — Encounter: Payer: Self-pay | Admitting: Internal Medicine

## 2024-07-15 ENCOUNTER — Other Ambulatory Visit: Payer: Self-pay

## 2024-07-15 MED ORDER — ESCITALOPRAM OXALATE 10 MG PO TABS: 10.0000 mg | ORAL_TABLET | Freq: Every day | ORAL | 11 refills | Status: AC

## 2024-08-03 ENCOUNTER — Other Ambulatory Visit: Payer: Self-pay | Admitting: Internal Medicine

## 2024-08-16 ENCOUNTER — Other Ambulatory Visit: Payer: Self-pay | Admitting: Gastroenterology

## 2024-09-07 ENCOUNTER — Ambulatory Visit
Admission: RE | Admit: 2024-09-07 | Discharge: 2024-09-07 | Disposition: A | Source: Ambulatory Visit | Attending: Oncology | Admitting: Oncology

## 2024-09-07 ENCOUNTER — Other Ambulatory Visit: Payer: Self-pay | Admitting: Oncology

## 2024-09-07 DIAGNOSIS — Z853 Personal history of malignant neoplasm of breast: Secondary | ICD-10-CM

## 2024-09-08 ENCOUNTER — Encounter: Payer: Self-pay | Admitting: Oncology

## 2024-09-09 ENCOUNTER — Other Ambulatory Visit: Payer: Self-pay

## 2025-02-23 ENCOUNTER — Ambulatory Visit: Admitting: Oncology
